# Patient Record
Sex: Female | Born: 1960 | Race: Black or African American | Hispanic: No | Marital: Single | Smoking: Current every day smoker
Health system: Southern US, Community
[De-identification: ages and names within clinical notes are randomized; demographics above are authoritative.]

## PROBLEM LIST (undated history)

## (undated) DIAGNOSIS — E785 Hyperlipidemia, unspecified: Secondary | ICD-10-CM

## (undated) DIAGNOSIS — N12 Tubulo-interstitial nephritis, not specified as acute or chronic: Secondary | ICD-10-CM

## (undated) DIAGNOSIS — T8859XA Other complications of anesthesia, initial encounter: Secondary | ICD-10-CM

## (undated) DIAGNOSIS — R519 Headache, unspecified: Secondary | ICD-10-CM

## (undated) DIAGNOSIS — T4145XA Adverse effect of unspecified anesthetic, initial encounter: Secondary | ICD-10-CM

## (undated) DIAGNOSIS — M797 Fibromyalgia: Secondary | ICD-10-CM

## (undated) DIAGNOSIS — I10 Essential (primary) hypertension: Secondary | ICD-10-CM

## (undated) DIAGNOSIS — Q8500 Neurofibromatosis, unspecified: Secondary | ICD-10-CM

## (undated) DIAGNOSIS — E119 Type 2 diabetes mellitus without complications: Secondary | ICD-10-CM

## (undated) DIAGNOSIS — H409 Unspecified glaucoma: Secondary | ICD-10-CM

## (undated) DIAGNOSIS — G40909 Epilepsy, unspecified, not intractable, without status epilepticus: Secondary | ICD-10-CM

## (undated) DIAGNOSIS — K219 Gastro-esophageal reflux disease without esophagitis: Secondary | ICD-10-CM

## (undated) DIAGNOSIS — R51 Headache: Secondary | ICD-10-CM

## (undated) HISTORY — PX: ABDOMINAL HYSTERECTOMY: SHX81

## (undated) HISTORY — PX: OTHER SURGICAL HISTORY: SHX169

## (undated) HISTORY — PX: CERVICAL FUSION: SHX112

## (undated) HISTORY — PX: COLON SURGERY: SHX602

---

## 2004-10-08 ENCOUNTER — Ambulatory Visit: Payer: Self-pay | Admitting: Family Medicine

## 2005-08-01 ENCOUNTER — Emergency Department: Payer: Self-pay | Admitting: Emergency Medicine

## 2006-03-16 ENCOUNTER — Ambulatory Visit: Payer: Self-pay | Admitting: Internal Medicine

## 2006-06-13 ENCOUNTER — Ambulatory Visit: Payer: Self-pay | Admitting: Pain Medicine

## 2006-06-19 ENCOUNTER — Ambulatory Visit: Payer: Self-pay | Admitting: Pain Medicine

## 2006-06-28 ENCOUNTER — Ambulatory Visit: Payer: Self-pay | Admitting: Pain Medicine

## 2006-07-04 ENCOUNTER — Ambulatory Visit: Payer: Self-pay | Admitting: Internal Medicine

## 2006-08-01 ENCOUNTER — Ambulatory Visit: Payer: Self-pay | Admitting: Pain Medicine

## 2006-08-16 ENCOUNTER — Ambulatory Visit: Payer: Self-pay | Admitting: Pain Medicine

## 2006-10-03 ENCOUNTER — Ambulatory Visit: Payer: Self-pay | Admitting: Pain Medicine

## 2006-10-04 ENCOUNTER — Ambulatory Visit: Payer: Self-pay | Admitting: Pain Medicine

## 2006-11-16 ENCOUNTER — Ambulatory Visit: Payer: Self-pay | Admitting: Pain Medicine

## 2006-11-29 ENCOUNTER — Ambulatory Visit: Payer: Self-pay | Admitting: Pain Medicine

## 2006-12-22 ENCOUNTER — Emergency Department: Payer: Self-pay

## 2006-12-27 ENCOUNTER — Emergency Department: Payer: Self-pay | Admitting: Unknown Physician Specialty

## 2006-12-29 DIAGNOSIS — E119 Type 2 diabetes mellitus without complications: Secondary | ICD-10-CM | POA: Insufficient documentation

## 2006-12-29 DIAGNOSIS — H409 Unspecified glaucoma: Secondary | ICD-10-CM | POA: Insufficient documentation

## 2007-01-04 ENCOUNTER — Ambulatory Visit: Payer: Self-pay | Admitting: Pain Medicine

## 2007-01-17 ENCOUNTER — Ambulatory Visit: Payer: Self-pay | Admitting: Pain Medicine

## 2007-01-19 ENCOUNTER — Ambulatory Visit: Payer: Self-pay | Admitting: Gastroenterology

## 2007-02-06 ENCOUNTER — Ambulatory Visit: Payer: Self-pay | Admitting: Pain Medicine

## 2007-02-12 ENCOUNTER — Ambulatory Visit: Payer: Self-pay | Admitting: Pain Medicine

## 2007-02-16 ENCOUNTER — Emergency Department: Payer: Self-pay | Admitting: Emergency Medicine

## 2007-03-15 ENCOUNTER — Ambulatory Visit: Payer: Self-pay | Admitting: Pain Medicine

## 2007-03-19 ENCOUNTER — Ambulatory Visit: Payer: Self-pay | Admitting: Pain Medicine

## 2007-03-28 ENCOUNTER — Ambulatory Visit: Payer: Self-pay | Admitting: Pain Medicine

## 2007-06-26 ENCOUNTER — Ambulatory Visit: Payer: Self-pay | Admitting: Pain Medicine

## 2007-07-04 ENCOUNTER — Ambulatory Visit: Payer: Self-pay | Admitting: Pain Medicine

## 2007-07-28 ENCOUNTER — Emergency Department: Payer: Self-pay | Admitting: Emergency Medicine

## 2007-10-11 ENCOUNTER — Ambulatory Visit: Payer: Self-pay | Admitting: Pain Medicine

## 2007-10-22 ENCOUNTER — Ambulatory Visit: Payer: Self-pay | Admitting: Pain Medicine

## 2007-11-22 ENCOUNTER — Ambulatory Visit: Payer: Self-pay | Admitting: Pain Medicine

## 2007-12-18 ENCOUNTER — Ambulatory Visit: Payer: Self-pay | Admitting: Pain Medicine

## 2008-01-22 ENCOUNTER — Ambulatory Visit: Payer: Self-pay | Admitting: Pain Medicine

## 2008-01-30 ENCOUNTER — Other Ambulatory Visit: Payer: Self-pay

## 2008-01-30 ENCOUNTER — Ambulatory Visit: Payer: Self-pay | Admitting: Unknown Physician Specialty

## 2008-02-07 ENCOUNTER — Ambulatory Visit: Payer: Self-pay | Admitting: Unknown Physician Specialty

## 2008-02-08 ENCOUNTER — Emergency Department: Payer: Self-pay | Admitting: Emergency Medicine

## 2008-03-06 ENCOUNTER — Ambulatory Visit: Payer: Self-pay | Admitting: Pain Medicine

## 2008-04-17 ENCOUNTER — Ambulatory Visit: Payer: Self-pay | Admitting: Pain Medicine

## 2008-04-30 ENCOUNTER — Ambulatory Visit: Payer: Self-pay | Admitting: Pain Medicine

## 2008-06-26 ENCOUNTER — Ambulatory Visit: Payer: Self-pay | Admitting: Pain Medicine

## 2008-07-02 ENCOUNTER — Ambulatory Visit: Payer: Self-pay | Admitting: Pain Medicine

## 2008-08-07 ENCOUNTER — Ambulatory Visit: Payer: Self-pay | Admitting: Pain Medicine

## 2008-08-18 ENCOUNTER — Ambulatory Visit: Payer: Self-pay | Admitting: Pain Medicine

## 2008-08-30 ENCOUNTER — Emergency Department: Payer: Self-pay | Admitting: Emergency Medicine

## 2009-02-10 ENCOUNTER — Ambulatory Visit: Payer: Self-pay | Admitting: Pain Medicine

## 2009-03-10 ENCOUNTER — Ambulatory Visit: Payer: Self-pay | Admitting: Pain Medicine

## 2009-03-16 ENCOUNTER — Ambulatory Visit: Payer: Self-pay | Admitting: Pain Medicine

## 2009-04-01 ENCOUNTER — Ambulatory Visit: Payer: Self-pay | Admitting: Unknown Physician Specialty

## 2009-04-08 ENCOUNTER — Ambulatory Visit: Payer: Self-pay | Admitting: Unknown Physician Specialty

## 2009-04-14 ENCOUNTER — Inpatient Hospital Stay: Payer: Self-pay | Admitting: Unknown Physician Specialty

## 2009-07-23 ENCOUNTER — Ambulatory Visit: Payer: Self-pay | Admitting: Internal Medicine

## 2009-08-07 ENCOUNTER — Ambulatory Visit: Payer: Self-pay | Admitting: Internal Medicine

## 2009-09-18 ENCOUNTER — Ambulatory Visit: Payer: Self-pay | Admitting: Internal Medicine

## 2009-09-21 ENCOUNTER — Ambulatory Visit: Payer: Self-pay | Admitting: Gastroenterology

## 2009-10-07 ENCOUNTER — Ambulatory Visit: Payer: Self-pay | Admitting: Internal Medicine

## 2009-12-24 ENCOUNTER — Ambulatory Visit: Payer: Self-pay | Admitting: Pain Medicine

## 2009-12-28 ENCOUNTER — Ambulatory Visit: Payer: Self-pay | Admitting: Pain Medicine

## 2009-12-29 ENCOUNTER — Ambulatory Visit: Payer: Self-pay | Admitting: Pain Medicine

## 2010-01-21 ENCOUNTER — Ambulatory Visit: Payer: Self-pay | Admitting: Pain Medicine

## 2010-02-01 ENCOUNTER — Ambulatory Visit: Payer: Self-pay | Admitting: Pain Medicine

## 2010-09-06 ENCOUNTER — Encounter: Payer: Self-pay | Admitting: Rheumatology

## 2010-09-07 ENCOUNTER — Encounter: Payer: Self-pay | Admitting: Rheumatology

## 2010-10-07 ENCOUNTER — Encounter: Payer: Self-pay | Admitting: Rheumatology

## 2011-01-18 ENCOUNTER — Ambulatory Visit: Payer: Self-pay | Admitting: Unknown Physician Specialty

## 2011-02-02 ENCOUNTER — Ambulatory Visit: Payer: Self-pay | Admitting: Anesthesiology

## 2011-02-03 ENCOUNTER — Ambulatory Visit: Payer: Self-pay | Admitting: Unknown Physician Specialty

## 2011-02-05 ENCOUNTER — Emergency Department: Payer: Self-pay | Admitting: Internal Medicine

## 2011-05-16 ENCOUNTER — Ambulatory Visit: Payer: Self-pay | Admitting: Unknown Physician Specialty

## 2011-05-26 ENCOUNTER — Inpatient Hospital Stay: Payer: Self-pay | Admitting: Internal Medicine

## 2011-06-09 ENCOUNTER — Ambulatory Visit: Payer: Self-pay | Admitting: Unknown Physician Specialty

## 2011-06-16 ENCOUNTER — Ambulatory Visit: Payer: Self-pay | Admitting: Unknown Physician Specialty

## 2012-08-22 ENCOUNTER — Ambulatory Visit: Payer: Self-pay | Admitting: Internal Medicine

## 2015-06-19 ENCOUNTER — Ambulatory Visit
Admission: RE | Admit: 2015-06-19 | Discharge: 2015-06-19 | Disposition: A | Discharge status: Home / Self Care | Payer: Medicare HMO | Source: Ambulatory Visit | Attending: Internal Medicine | Admitting: Internal Medicine

## 2015-06-19 ENCOUNTER — Other Ambulatory Visit: Payer: Self-pay | Admitting: Internal Medicine

## 2015-06-19 DIAGNOSIS — J40 Bronchitis, not specified as acute or chronic: Secondary | ICD-10-CM

## 2015-11-17 ENCOUNTER — Other Ambulatory Visit: Payer: Self-pay | Admitting: Internal Medicine

## 2015-11-17 ENCOUNTER — Ambulatory Visit
Admission: RE | Admit: 2015-11-17 | Discharge: 2015-11-17 | Disposition: A | Discharge status: Home / Self Care | Payer: Medicare HMO | Source: Ambulatory Visit | Attending: Internal Medicine | Admitting: Internal Medicine

## 2015-11-17 DIAGNOSIS — R918 Other nonspecific abnormal finding of lung field: Secondary | ICD-10-CM | POA: Insufficient documentation

## 2015-11-17 DIAGNOSIS — F17200 Nicotine dependence, unspecified, uncomplicated: Secondary | ICD-10-CM | POA: Diagnosis present

## 2015-11-17 DIAGNOSIS — R042 Hemoptysis: Secondary | ICD-10-CM

## 2016-02-11 ENCOUNTER — Encounter: Payer: Self-pay | Admitting: *Deleted

## 2016-02-12 ENCOUNTER — Encounter: Payer: Self-pay | Admitting: Anesthesiology

## 2016-02-12 ENCOUNTER — Ambulatory Visit: Payer: Medicare HMO | Admitting: Anesthesiology

## 2016-02-12 ENCOUNTER — Encounter
Admission: RE | Disposition: A | Discharge status: Home / Self Care | Payer: Self-pay | Source: Ambulatory Visit | Attending: Gastroenterology

## 2016-02-12 ENCOUNTER — Ambulatory Visit
Admission: RE | Admit: 2016-02-12 | Discharge: 2016-02-12 | Disposition: A | Discharge status: Home / Self Care | Payer: Medicare HMO | Source: Ambulatory Visit | Attending: Gastroenterology | Admitting: Gastroenterology

## 2016-02-12 DIAGNOSIS — Z79899 Other long term (current) drug therapy: Secondary | ICD-10-CM | POA: Diagnosis not present

## 2016-02-12 DIAGNOSIS — K644 Residual hemorrhoidal skin tags: Secondary | ICD-10-CM | POA: Insufficient documentation

## 2016-02-12 DIAGNOSIS — R1310 Dysphagia, unspecified: Secondary | ICD-10-CM | POA: Diagnosis present

## 2016-02-12 DIAGNOSIS — Z90710 Acquired absence of both cervix and uterus: Secondary | ICD-10-CM | POA: Diagnosis not present

## 2016-02-12 DIAGNOSIS — K210 Gastro-esophageal reflux disease with esophagitis: Secondary | ICD-10-CM | POA: Insufficient documentation

## 2016-02-12 DIAGNOSIS — K625 Hemorrhage of anus and rectum: Secondary | ICD-10-CM | POA: Diagnosis not present

## 2016-02-12 DIAGNOSIS — I10 Essential (primary) hypertension: Secondary | ICD-10-CM | POA: Insufficient documentation

## 2016-02-12 DIAGNOSIS — Z6836 Body mass index (BMI) 36.0-36.9, adult: Secondary | ICD-10-CM | POA: Insufficient documentation

## 2016-02-12 DIAGNOSIS — E669 Obesity, unspecified: Secondary | ICD-10-CM | POA: Diagnosis not present

## 2016-02-12 DIAGNOSIS — F17200 Nicotine dependence, unspecified, uncomplicated: Secondary | ICD-10-CM | POA: Insufficient documentation

## 2016-02-12 DIAGNOSIS — Z9889 Other specified postprocedural states: Secondary | ICD-10-CM | POA: Insufficient documentation

## 2016-02-12 DIAGNOSIS — K640 First degree hemorrhoids: Secondary | ICD-10-CM | POA: Insufficient documentation

## 2016-02-12 DIAGNOSIS — E785 Hyperlipidemia, unspecified: Secondary | ICD-10-CM | POA: Insufficient documentation

## 2016-02-12 DIAGNOSIS — R12 Heartburn: Secondary | ICD-10-CM | POA: Insufficient documentation

## 2016-02-12 DIAGNOSIS — Z791 Long term (current) use of non-steroidal anti-inflammatories (NSAID): Secondary | ICD-10-CM | POA: Diagnosis not present

## 2016-02-12 HISTORY — PX: ESOPHAGOGASTRODUODENOSCOPY (EGD) WITH PROPOFOL: SHX5813

## 2016-02-12 HISTORY — DX: Hyperlipidemia, unspecified: E78.5

## 2016-02-12 HISTORY — DX: Essential (primary) hypertension: I10

## 2016-02-12 HISTORY — DX: Gastro-esophageal reflux disease without esophagitis: K21.9

## 2016-02-12 HISTORY — PX: COLONOSCOPY WITH PROPOFOL: SHX5780

## 2016-02-12 LAB — GLUCOSE, CAPILLARY
Glucose-Capillary: 291 mg/dL — ABNORMAL HIGH (ref 65–99)
Glucose-Capillary: 243 mg/dL — ABNORMAL HIGH (ref 65–99)

## 2016-02-12 SURGERY — COLONOSCOPY WITH PROPOFOL
Anesthesia: General

## 2016-02-12 MED ORDER — SODIUM CHLORIDE 0.9 % IV SOLN
INTRAVENOUS | Status: DC
  Administered 2016-02-12 (×2): via INTRAVENOUS

## 2016-02-12 MED ORDER — PROPOFOL 10 MG/ML IV BOLUS
INTRAVENOUS | Status: DC | PRN
  Administered 2016-02-12: 100 mg via INTRAVENOUS

## 2016-02-12 MED ORDER — PROPOFOL 500 MG/50ML IV EMUL
INTRAVENOUS | Status: DC | PRN
  Administered 2016-02-12: 150 ug/kg/min via INTRAVENOUS

## 2016-02-12 MED ORDER — INSULIN ASPART 100 UNIT/ML ~~LOC~~ SOLN
6.0000 [IU] | Freq: Once | SUBCUTANEOUS | Status: AC
  Administered 2016-02-12: 6 [IU] via SUBCUTANEOUS
  Filled 2016-02-12: qty 0.06

## 2016-02-12 NOTE — Discharge Instructions (Signed)

## 2016-02-12 NOTE — Op Note (Signed)
[facility] Gastroenterology Patient Name: [place] Procedure Date: 02/12/2016 9:04 AM MRN: [id number] Account #: [id number] Date of Birth: [birth year] Admit Type: Outpatient Age: 55 Room: [facility] ENDO ROOM 3 Gender: Female Note Status: Finalized Procedure:            Colonoscopy Indications:          Last colonoscopy: date unknown(remote past), Rectal                        bleeding Patient Profile:      This is a 55 year old female. Providers:            Gerrit Heck. Rayann Heman, MD Referring MD:         Mikeal Hawthorne. Brynda Greathouse, MD (Referring MD) Medicines:            Propofol per Anesthesia Complications:        No immediate complications. Procedure:            Pre-Anesthesia Assessment:                       - Prior to the procedure, a History and Physical was                        performed, and patient medications, allergies and                        sensitivities were reviewed. The patient's tolerance of                        previous anesthesia was reviewed.                       After obtaining informed consent, the colonoscope was                        passed under direct vision. Throughout the procedure,                        the patient's blood pressure, pulse, and oxygen                        saturations were monitored continuously. The                        Colonoscope was introduced through the anus and                        advanced to the the cecum, identified by appendiceal                        orifice and ileocecal valve. The colonoscopy was                        performed without difficulty. The patient tolerated the                        procedure well. The quality of the bowel preparation                        was fair with poor areas in sigmoid and rectum. The  patient tolerated the procedure well. Findings:      The perianal exam findings include non-thrombosed external hemorrhoids.      Internal hemorrhoids were  found during retroflexion. The hemorrhoids       were Grade I (internal hemorrhoids that do not prolapse).      The exam was otherwise without abnormality. Impression:           - Preparation of the colon was fair with poor areas in                        sigmoid and rectum.                       - Non-thrombosed external hemorrhoids found on perianal                        exam.                       - Internal hemorrhoids.                       - The examination was otherwise normal.                       - No specimens collected. Recommendation:       - Observe patient in GI recovery unit.                       - High fiber diet.                       - Continue present medications.                       - Repeat colonoscopy in 1 year because the bowel                        preparation was fair with poor areas in rectum and                        sigmoid                       - Return to referring physician.                       - The findings and recommendations were discussed with                        the patient.                       - The findings and recommendations were discussed with                        the patient's family. Procedure Code(s):    --- Professional ---                       [phone], Colonoscopy, flexible; diagnostic, including                        collection of specimen(s) by brushing or washing, when  performed (separate procedure) Diagnosis Code(s):    --- Professional ---                       K64.0, First degree hemorrhoids                       K64.4, Residual hemorrhoidal skin tags                       K62.5, Hemorrhage of anus and rectum CPT copyright 2016 American Medical Association. All rights reserved. The codes documented in this report are preliminary and upon coder review may  be revised to meet current compliance requirements. Mellody Life, MD 02/12/2016 9:36:27 AM This report has been signed  electronically. Number of Addenda: 0 Note Initiated On: 02/12/2016 9:04 AM Scope Withdrawal Time: 0 hours 18 minutes 57 seconds  Total Procedure Duration: 0 hours 25 minutes 5 seconds       [facility]

## 2016-02-12 NOTE — Anesthesia Preprocedure Evaluation (Addendum)
Anesthesia Evaluation  Patient identified by MRN, date of birth, ID band Patient awake    Reviewed: Allergy & Precautions, NPO status , Patient's Chart, lab work & pertinent test results, reviewed documented beta blocker date and time   Airway Mallampati: III  TM Distance: >3 FB     Dental  (+) Chipped, Missing   Pulmonary Current Smoker,           Cardiovascular hypertension,      Neuro/Psych    GI/Hepatic GERD  ,  Endo/Other    Renal/GU      Musculoskeletal   Abdominal   Peds  Hematology   Anesthesia Other Findings Obese.  Reproductive/Obstetrics                            Anesthesia Physical Anesthesia Plan  ASA: II  Anesthesia Plan: General   Post-op Pain Management:    Induction: Intravenous  Airway Management Planned: Nasal Cannula  Additional Equipment:   Intra-op Plan:   Post-operative Plan:   Informed Consent: I have reviewed the patients History and Physical, chart, labs and discussed the procedure including the risks, benefits and alternatives for the proposed anesthesia with the patient or authorized representative who has indicated his/her understanding and acceptance.     Plan Discussed with: CRNA  Anesthesia Plan Comments:         Anesthesia Quick Evaluation

## 2016-02-12 NOTE — Op Note (Signed)
[facility] Gastroenterology Patient Name: [place] Procedure Date: 02/12/2016 8:52 AM MRN: [id number] Account #: [id number] Date of Birth: 10/21/1961 Admit Type: Outpatient Age: 55 Room: [facility] ENDO ROOM 3 Gender: Female Note Status: Finalized Procedure:            Upper GI endoscopy Indications:          Dysphagia, Heartburn, Suspected esophageal reflux Patient Profile:      This is a 55 year old female. Providers:            Gerrit Heck. Rayann Heman, MD Referring MD:         Mikeal Hawthorne. Brynda Greathouse, MD (Referring MD) Medicines:            Propofol per Anesthesia Complications:        No immediate complications. Procedure:            Pre-Anesthesia Assessment:                       - Prior to the procedure, a History and Physical was                        performed, and patient medications, allergies and                        sensitivities were reviewed. The patient's tolerance of                        previous anesthesia was reviewed.                       After obtaining informed consent, the endoscope was                        passed under direct vision. Throughout the procedure,                        the patient's blood pressure, pulse, and oxygen                        saturations were monitored continuously. The Endoscope                        was introduced through the mouth, and advanced to the                        second part of duodenum. The upper GI endoscopy was                        accomplished without difficulty. The patient tolerated                        the procedure well. Findings:      The Z-line was irregular and was found at the gastroesophageal junction.       Biopsies were taken with a cold forceps for histology.      The stomach was normal.      The examined duodenum was normal.      Three biopsies were obtained with cold forceps for histology randomly in       the upper third of the esophagus. Impression:           -  Z-line irregular,  at the gastroesophageal junction.                        Biopsied.                       - Normal stomach.                       - Normal examined duodenum. Recommendation:       - Perform a colonoscopy today.                       - Continue present medications.                       - Await pathology results.                       - The findings and recommendations were discussed with                        the patient.                       - The findings and recommendations were discussed with                        the patient's family. Procedure Code(s):    --- Professional ---                       [phone], Esophagogastroduodenoscopy, flexible, transoral;                        with biopsy, single or multiple Diagnosis Code(s):    --- Professional ---                       K22.8, Other specified diseases of esophagus                       R13.10, Dysphagia, unspecified                       R12, Heartburn CPT copyright 2016 American Medical Association. All rights reserved. The codes documented in this report are preliminary and upon coder review may  be revised to meet current compliance requirements. Mellody Life, MD 02/12/2016 9:04:08 AM This report has been signed electronically. Number of Addenda: 0 Note Initiated On: 02/12/2016 8:52 AM      [facility]

## 2016-02-12 NOTE — Transfer of Care (Signed)
Immediate Anesthesia Transfer of Care Note  Patient: Laura Camacho  Procedure(s) Performed: Procedure(s): COLONOSCOPY WITH PROPOFOL (N/A) ESOPHAGOGASTRODUODENOSCOPY (EGD) WITH PROPOFOL (N/A)  Patient Location: PACU  Anesthesia Type:General  Level of Consciousness: awake and alert   Airway & Oxygen Therapy: Patient Spontanous Breathing and Patient connected to nasal cannula oxygen  Post-op Assessment: Report given to RN  Post vital signs: Reviewed  Last Vitals:  Filed Vitals:   02/12/16 0750  BP: 149/69  Pulse: 72  Temp: 36.3 C  Resp: 16    Complications: No apparent anesthesia complications

## 2016-02-12 NOTE — H&P (Signed)
  Primary Care Physician:  Marden Noble, MD  Pre-Procedure History & Physical: HPI:  Laura Camacho is a 55 y.o. female is here for an endoscopy / colonoscopy.    Past Medical History  Diagnosis Date  . GERD (gastroesophageal reflux disease)   . Hypertension   . Hyperlipidemia     Past Surgical History  Procedure Laterality Date  . Abdominal hysterectomy    . Colon surgery    . Bilateral arm surgery Bilateral     'tumors' removed    Prior to Admission medications   Medication Sig Start Date End Date Taking? Authorizing Provider  atorvastatin (LIPITOR) 20 MG tablet Take 20 mg by mouth daily.   Yes Historical Provider, MD  clarithromycin (BIAXIN) 500 MG tablet Take 500 mg by mouth 2 (two) times daily.   Yes Historical Provider, MD  dexlansoprazole (DEXILANT) 60 MG capsule Take 60 mg by mouth daily.   Yes Historical Provider, MD  esterified estrogens (MENEST) 0.625 MG tablet Take 0.625 mg by mouth daily.   Yes Historical Provider, MD  gabapentin (NEURONTIN) 300 MG capsule Take 300 mg by mouth 2 (two) times daily.   Yes Historical Provider, MD  meloxicam (MOBIC) 15 MG tablet Take 15 mg by mouth daily.   Yes Historical Provider, MD  nitrofurantoin, macrocrystal-monohydrate, (MACROBID) 100 MG capsule Take 100 mg by mouth 2 (two) times daily.   Yes Historical Provider, MD  omeprazole (PRILOSEC) 40 MG capsule Take 40 mg by mouth daily.   Yes Historical Provider, MD  ranitidine (ZANTAC) 150 MG capsule Take 150 mg by mouth 2 (two) times daily.   Yes Historical Provider, MD  saxagliptin HCl (ONGLYZA) 2.5 MG TABS tablet Take 2.5 mg by mouth daily.   Yes Historical Provider, MD  terazosin (HYTRIN) 5 MG capsule Take 5 mg by mouth at bedtime.   Yes Historical Provider, MD    Allergies as of 01/26/2016  . (Not on File)    History reviewed. No pertinent family history.  Social History   Social History  . Marital Status: Single    Spouse Name: N/A  . Number of Children: N/A  .  Years of Education: N/A   Occupational History  . Not on file.   Social History Main Topics  . Smoking status: Current Every Day Smoker  . Smokeless tobacco: Never Used  . Alcohol Use: No  . Drug Use: No  . Sexual Activity: Not on file   Other Topics Concern  . Not on file   Social History Narrative     Physical Exam: BP 149/69 mmHg  Pulse 72  Temp(Src) 97.3 F (36.3 C) (Tympanic)  Resp 16  Ht 5\' 2"  (1.575 m)  Wt 90.719 kg (200 lb)  BMI 36.57 kg/m2  SpO2 100% General:   Alert,  pleasant and cooperative in NAD Head:  Normocephalic and atraumatic. Neck:  Supple; no masses or thyromegaly. Lungs:  Clear throughout to auscultation.    Heart:  Regular rate and rhythm. Abdomen:  Soft, nontender and nondistended. Normal bowel sounds, without guarding, and without rebound.   Neurologic:  Alert and  oriented x4;  grossly normal neurologically.  Impression/Plan: Laura Camacho is here for an endoscopy to be performed for GERD, regurgitation. Colon for rectal bleeding.  Risks, benefits, limitations, and alternatives regarding  Endoscopy/colonoscopy have been reviewed with the patient.  Questions have been answered.  All parties agreeable.   Josefine Class, MD  02/12/2016, 8:47 AM

## 2016-02-12 NOTE — Anesthesia Postprocedure Evaluation (Signed)
Anesthesia Post Note  Patient: Laura Camacho  Procedure(s) Performed: Procedure(s) (LRB): COLONOSCOPY WITH PROPOFOL (N/A) ESOPHAGOGASTRODUODENOSCOPY (EGD) WITH PROPOFOL (N/A)  Patient location during evaluation: Endoscopy Anesthesia Type: General Level of consciousness: awake and alert Pain management: pain level controlled Vital Signs Assessment: post-procedure vital signs reviewed and stable Respiratory status: spontaneous breathing, nonlabored ventilation, respiratory function stable and patient connected to nasal cannula oxygen Cardiovascular status: blood pressure returned to baseline and stable Postop Assessment: no signs of nausea or vomiting Anesthetic complications: no    Last Vitals:  Filed Vitals:   02/12/16 0950 02/12/16 1000  BP: 155/90 163/81  Pulse: 79   Temp:    Resp: 20     Last Pain: There were no vitals filed for this visit.               [name] S

## 2016-02-15 LAB — SURGICAL PATHOLOGY

## 2016-02-16 ENCOUNTER — Encounter: Payer: Self-pay | Admitting: Gastroenterology

## 2016-03-06 ENCOUNTER — Encounter: Payer: Self-pay | Admitting: Emergency Medicine

## 2016-03-06 ENCOUNTER — Emergency Department: Payer: Medicare HMO

## 2016-03-06 ENCOUNTER — Emergency Department
Admission: EM | Admit: 2016-03-06 | Discharge: 2016-03-07 | Disposition: A | Discharge status: Home / Self Care | Payer: Medicare HMO | Attending: Student | Admitting: Student

## 2016-03-06 DIAGNOSIS — E119 Type 2 diabetes mellitus without complications: Secondary | ICD-10-CM | POA: Insufficient documentation

## 2016-03-06 DIAGNOSIS — I10 Essential (primary) hypertension: Secondary | ICD-10-CM | POA: Diagnosis not present

## 2016-03-06 DIAGNOSIS — R531 Weakness: Secondary | ICD-10-CM | POA: Insufficient documentation

## 2016-03-06 DIAGNOSIS — F17200 Nicotine dependence, unspecified, uncomplicated: Secondary | ICD-10-CM | POA: Diagnosis not present

## 2016-03-06 DIAGNOSIS — R55 Syncope and collapse: Secondary | ICD-10-CM | POA: Insufficient documentation

## 2016-03-06 DIAGNOSIS — E785 Hyperlipidemia, unspecified: Secondary | ICD-10-CM | POA: Insufficient documentation

## 2016-03-06 HISTORY — DX: Type 2 diabetes mellitus without complications: E11.9

## 2016-03-06 LAB — BASIC METABOLIC PANEL
ANION GAP: 15 (ref 5–15)
BUN: 21 mg/dL — ABNORMAL HIGH (ref 6–20)
CHLORIDE: 97 mmol/L — AB (ref 101–111)
CO2: 22 mmol/L (ref 22–32)
Calcium: 9.8 mg/dL (ref 8.9–10.3)
Creatinine, Ser: 1.19 mg/dL — ABNORMAL HIGH (ref 0.44–1.00)
GFR calc non Af Amer: 50 mL/min — ABNORMAL LOW (ref >60)
GFR, EST AFRICAN AMERICAN: 58 mL/min — AB (ref >60)
GLUCOSE: 316 mg/dL — AB (ref 65–99)
POTASSIUM: 3.9 mmol/L (ref 3.5–5.1)
Sodium: 134 mmol/L — ABNORMAL LOW (ref 135–145)

## 2016-03-06 LAB — URINALYSIS COMPLETE WITH MICROSCOPIC (ARMC ONLY)
BACTERIA UA: NONE SEEN
Bilirubin Urine: NEGATIVE
Hgb urine dipstick: NEGATIVE
Leukocytes, UA: NEGATIVE
Nitrite: NEGATIVE
Protein, ur: 30 mg/dL — AB
RBC / HPF: NONE SEEN RBC/hpf (ref 0–5)
SPECIFIC GRAVITY, URINE: 1.021 (ref 1.005–1.030)
pH: 5 (ref 5.0–8.0)

## 2016-03-06 LAB — TROPONIN I: Troponin I: <0.03 ng/mL (ref <0.031)

## 2016-03-06 LAB — GLUCOSE, CAPILLARY: Glucose-Capillary: 296 mg/dL — ABNORMAL HIGH (ref 65–99)

## 2016-03-06 LAB — CBC
HCT: 45.4 % (ref 35.0–47.0)
HEMOGLOBIN: 15.1 g/dL (ref 12.0–16.0)
MCH: 26.9 pg (ref 26.0–34.0)
MCHC: 33.3 g/dL (ref 32.0–36.0)
MCV: 80.8 fL (ref 80.0–100.0)
Platelets: 367 10*3/uL (ref 150–440)
RBC: 5.62 MIL/uL — AB (ref 3.80–5.20)
RDW: 13.6 % (ref 11.5–14.5)
WBC: 12.6 10*3/uL — ABNORMAL HIGH (ref 3.6–11.0)

## 2016-03-06 LAB — FIBRIN DERIVATIVES D-DIMER (ARMC ONLY): Fibrin derivatives D-dimer (ARMC): 890 — ABNORMAL HIGH (ref 0–499)

## 2016-03-06 MED ORDER — SODIUM CHLORIDE 0.9 % IV BOLUS (SEPSIS)
1000.0000 mL | Freq: Once | INTRAVENOUS | Status: AC
  Administered 2016-03-06: 1000 mL via INTRAVENOUS

## 2016-03-06 MED ORDER — IOPAMIDOL (ISOVUE-370) INJECTION 76%
75.0000 mL | Freq: Once | INTRAVENOUS | Status: AC | PRN
  Administered 2016-03-06: 75 mL via INTRAVENOUS

## 2016-03-06 MED ORDER — HYDROCORTISONE NA SUCCINATE PF 100 MG IJ SOLR
INTRAMUSCULAR | Status: AC
  Administered 2016-03-06: 200 mg via INTRAVENOUS
  Filled 2016-03-06: qty 4

## 2016-03-06 MED ORDER — DIPHENHYDRAMINE HCL 50 MG/ML IJ SOLN
50.0000 mg | Freq: Once | INTRAMUSCULAR | Status: AC
Start: 2016-03-06 — End: 2016-03-06
  Administered 2016-03-06: 50 mg via INTRAVENOUS
  Filled 2016-03-06: qty 1

## 2016-03-06 MED ORDER — HYDROCORTISONE NA SUCCINATE PF 250 MG IJ SOLR
200.0000 mg | Freq: Once | INTRAMUSCULAR | Status: AC
  Administered 2016-03-06: 200 mg via INTRAVENOUS
  Filled 2016-03-06: qty 200

## 2016-03-06 NOTE — ED Notes (Addendum)
Pt. States she was sitting on a stool/chair in shower when she started to feel weak this morning pt. Denies hitting head.

## 2016-03-06 NOTE — ED Notes (Signed)
Patient transported to X-ray 

## 2016-03-06 NOTE — ED Provider Notes (Signed)
-----------------------------------------   11:58 PM on 03/06/2016 -----------------------------------------   Blood pressure 161/74, pulse 67, temperature 97.7 F (36.5 C), temperature source Oral, resp. rate 22, height 5\' 2"  (1.575 m), weight 200 lb (90.719 kg), SpO2 97 %.  Assuming care from Dr. Reita Cliche.  In short, Laura Camacho is a 55 y.o. female with a chief complaint of Weakness .  Refer to the original H&P for additional details.  The current plan of care is to follow up the results of the CT scan and disposition the patient.   CT Angio chest: No evidence of significant pulmonary embolus. No evidence of acute pulmonary disease. Diffuse fatty infiltration of the liver  The patient's CT scan is unremarkable. She will be discharged home to follow-up with her primary care physician. I discussed this with the patient and she had no further questions or concerns at this time.  Loney Hering, MD 03/06/16 [phone]

## 2016-03-06 NOTE — ED Notes (Signed)
Pt presents to ED with complaints of generalized weakness that has gradually worsened ever since having a colonoscopy on 02/12/2016. Pt states she has been taking all of her medications as ordered. Pt reports her CBG at home has been stable and runs around 100. Pt states she takes oral medications for diabetes.

## 2016-03-06 NOTE — ED Provider Notes (Signed)
[facility] Emergency Department Provider Note   ____________________________________________  Time seen: Approximately 2:26 PM  I have reviewed the triage vital signs and the nursing notes.   HISTORY  Chief Complaint Weakness    HPI Laura Camacho is a 55 y.o. female with history of hypertension, hyperlipidemia, diabetes, GERD, who presents for evaluation of possibly 3 weeks of generalized weakness and fatigue, gradual onset, constant, no modifying factors. Patient reports that she had an endoscopy performed on 02/12/2016 under general anesthesia for evaluation of GERD and rectal bleeding. She reports that the procedure well well however since that time she has had fatigue and poor energy. Today she was attempting to give herself a bath while sitting on a shower stool and she began feeling quite weak, lightheaded and as if she might faint. Her family was nearby and they removed her from the tub before she could fall or hurt herself. They're not sure whether not she fainted or nearly fainted No chest pain or difficulty breathing. No abdominal pain, vomiting, diarrhea or fevers or chills. No hematemastasis or recent bright red blood per rectum. She does take an estrogen supplement for treatment of menopausal symptoms.   Past Medical History  Diagnosis Date  . GERD (gastroesophageal reflux disease)   . Hypertension   . Hyperlipidemia   . Diabetes mellitus without complication ([place])     There are no active problems to display for this patient.   Past Surgical History  Procedure Laterality Date  . Abdominal hysterectomy    . Colon surgery    . Bilateral arm surgery Bilateral     'tumors' removed  . Colonoscopy with propofol N/A 02/12/2016    Procedure: COLONOSCOPY WITH PROPOFOL;  Surgeon: Josefine Class, MD;  Location: [facility] ENDOSCOPY;  Service: Endoscopy;  Laterality: N/A;  . Esophagogastroduodenoscopy (egd) with propofol N/A 02/12/2016    Procedure:  ESOPHAGOGASTRODUODENOSCOPY (EGD) WITH PROPOFOL;  Surgeon: Josefine Class, MD;  Location: [facility] ENDOSCOPY;  Service: Endoscopy;  Laterality: N/A;    Current Outpatient Rx  Name  Route  Sig  Dispense  Refill  . atorvastatin (LIPITOR) 20 MG tablet   Oral   Take 20 mg by mouth daily.         . clarithromycin (BIAXIN) 500 MG tablet   Oral   Take 500 mg by mouth 2 (two) times daily.         Marland Kitchen dexlansoprazole (DEXILANT) 60 MG capsule   Oral   Take 60 mg by mouth daily.         Marland Kitchen esterified estrogens (MENEST) 0.625 MG tablet   Oral   Take 0.625 mg by mouth daily.         Marland Kitchen gabapentin (NEURONTIN) 300 MG capsule   Oral   Take 300 mg by mouth 2 (two) times daily.         . meloxicam (MOBIC) 15 MG tablet   Oral   Take 15 mg by mouth daily.         . nitrofurantoin, macrocrystal-monohydrate, (MACROBID) 100 MG capsule   Oral   Take 100 mg by mouth 2 (two) times daily.         Marland Kitchen omeprazole (PRILOSEC) 40 MG capsule   Oral   Take 40 mg by mouth daily.         . ranitidine (ZANTAC) 150 MG capsule   Oral   Take 150 mg by mouth 2 (two) times daily.         Marland Kitchen  saxagliptin HCl (ONGLYZA) 2.5 MG TABS tablet   Oral   Take 2.5 mg by mouth daily.         Marland Kitchen terazosin (HYTRIN) 5 MG capsule   Oral   Take 5 mg by mouth at bedtime.           Allergies Codeine; Bupropion; Cefuroxime; Ciprofloxacin; Fluconazole; Ibuprofen; Iodine; Ivp dye; Metformin and related; Nizoral; Sulfa antibiotics; and Voltaren  No family history on file.  Social History Social History  Substance Use Topics  . Smoking status: Current Every Day Smoker  . Smokeless tobacco: Never Used  . Alcohol Use: No    Review of Systems Constitutional: No fever/chills Eyes: No visual changes. ENT: No sore throat. Cardiovascular: Denies chest pain. Respiratory: Denies shortness of breath. Gastrointestinal: No abdominal pain.  No nausea, no vomiting.  No diarrhea.  No constipation. Genitourinary:  Negative for dysuria. Musculoskeletal: Negative for back pain. Skin: Negative for rash. Neurological: Negative for headaches, focal weakness or numbness.  10-point ROS otherwise negative.  ____________________________________________   PHYSICAL EXAM:  VITAL SIGNS: ED Triage Vitals  Enc Vitals Group     BP 03/06/16 1348 164/87 mmHg     Pulse Rate 03/06/16 1348 77     Resp 03/06/16 1348 18     Temp 03/06/16 1348 97.7 F (36.5 C)     Temp Source 03/06/16 1348 Oral     SpO2 03/06/16 1348 97 %     Weight 03/06/16 1348 200 lb (90.719 kg)     Height 03/06/16 1348 5\' 2"  (1.575 m)     Head Cir --      Peak Flow --      Pain Score --      Pain Loc --      Pain Edu? --      Excl. in [place]? --     Constitutional: Alert and oriented. Well appearing and in no acute distress. Eyes: Conjunctivae are normal. PERRL. EOMI. Head: Atraumatic. Nose: No congestion/rhinnorhea. Mouth/Throat: Mucous membranes are moist.  Oropharynx non-erythematous. Neck: No stridor.  Supple without meningismus. Cardiovascular: Normal rate, regular rhythm. Grossly normal heart sounds.  Good peripheral circulation. Respiratory: Normal respiratory effort.  No retractions. Lungs CTAB. Gastrointestinal: Soft and nontender. No distention.  No CVA tenderness. Genitourinary: deferred Musculoskeletal: No lower extremity tenderness nor edema.  No joint effusions. Neurologic:  Normal speech and language. No gross focal neurologic deficits are appreciated. No gait instability. 5 out of 5 strength bilateral upper and lower extremities, sensation intact to light touch throughout, cranial nerves II through XII intact. Skin:  Skin is warm, dry and intact. No rash noted. Psychiatric: Mood and affect are normal. Speech and behavior are normal.  ____________________________________________   LABS (all labs ordered are listed, but only abnormal results are displayed)  Labs Reviewed  BASIC METABOLIC PANEL - Abnormal; Notable for  the following:    Sodium 134 (*)    Chloride 97 (*)    Glucose, Bld 316 (*)    BUN 21 (*)    Creatinine, Ser 1.19 (*)    GFR calc non Af Amer 50 (*)    GFR calc Af Amer 58 (*)    All other components within normal limits  CBC - Abnormal; Notable for the following:    WBC 12.6 (*)    RBC 5.62 (*)    All other components within normal limits  URINALYSIS COMPLETEWITH MICROSCOPIC ([facility] ONLY) - Abnormal; Notable for the following:    Color, Urine STRAW (*)  APPearance CLEAR (*)    Glucose, UA >500 (*)    Ketones, ur TRACE (*)    Protein, ur 30 (*)    Squamous Epithelial / LPF 0-5 (*)    All other components within normal limits  GLUCOSE, CAPILLARY - Abnormal; Notable for the following:    Glucose-Capillary 296 (*)    All other components within normal limits  FIBRIN DERIVATIVES D-DIMER ([facility] ONLY) - Abnormal; Notable for the following:    Fibrin derivatives D-dimer ([facility]) 890 (*)    All other components within normal limits  TROPONIN I  CBG MONITORING, ED   ____________________________________________  EKG  ED ECG REPORT I, Joanne Gavel, the attending physician, personally viewed and interpreted this ECG.   Date: 03/06/2016  EKG Time: 17:16  Rate: 71  Rhythm: normal sinus rhythm  Axis: Borderline left axis deviation.  Intervals:none  ST&T Change: No acute ST elevation.  ____________________________________________  RADIOLOGY  CXR  IMPRESSION: No active cardiopulmonary disease.  ____________________________________________   PROCEDURES  Procedure(s) performed: None  Critical Care performed: No  ____________________________________________   INITIAL IMPRESSION / ASSESSMENT AND PLAN / ED COURSE  Pertinent labs & imaging results that were available during my care of the patient were reviewed by me and considered in my medical decision making (see chart for details).  Laura Camacho is a 55 y.o. female with history of hypertension,  hyperlipidemia, diabetes, GERD, who presents for evaluation of possibly 3 weeks of generalized weakness and fatigue as well as a near syncopal vs syncopal episode today. On exam, she is well-appearing and in no acute distress. Her vital signs are stable, she is afebrile. She is an intact neurological examination. I reviewed her labs, her creatinine is mildly elevated at 1.19, BUN is also mildly elevated at 21, we'll give IV fluids. CBC with mild leukocytosis, white blood cell count is 12.6 however this is nonspecific. Negative troponin. Urinalysis not consistent with infection. Chest x-ray clear. Nonspecific fatigue and weakness though this may be have been a vasovagal episode secondary to dehydration. No indication for CT head, no trauma, normal neurological examination is a purely neurogenic cause of syncope unlikely. No chest pain or palpitations makes cardiogenic syncope unlikely. We'll obtain d-dimer given that she is prescribed estrogen and pursue CTA chest to rule out PE only if d-dimer is elevated.  ----------------------------------------- 6:34 PM on 03/06/2016 ----------------------------------------- D-dimer is elevated at 890 and the patient will require a CT image of her chest to rule out PE, however, she has a history of allergic reaction to iodine stating that he gave her hives in the past patient denies any anaphylactic reaction. According to the new radiology premedication protocol, she will require a 4 hour prep. She has received hydrocortisone, we'll give Benadryl 1 hour prior to scan.  ----------------------------------------- 8:44 PM on 03/06/2016 ----------------------------------------- Awaiting CTA chest to rule out PE. If CTA negative, and the patient continues to appear well, anticipate that she can be discharged with close outpatient follow-up. Care transferred to Dr. Reita Cliche at this time.   ____________________________________________   FINAL CLINICAL IMPRESSION(S) / ED  DIAGNOSES  Final diagnoses:  Weakness  Near syncope      NEW MEDICATIONS STARTED DURING THIS VISIT:  New Prescriptions   No medications on file     Note:  This document was prepared using Dragon voice recognition software and may include unintentional dictation errors.    Joanne Gavel, MD 03/06/16 2045

## 2016-03-06 NOTE — ED Notes (Signed)
Patient transported to CT 

## 2016-03-06 NOTE — ED Notes (Signed)
Pt. Hit call bell, stated she felt heaviness in legs.  Pt. Has equal strength in both lower extremities and upper extremities.

## 2016-12-28 ENCOUNTER — Emergency Department
Admission: EM | Admit: 2016-12-28 | Discharge: 2016-12-28 | Disposition: A | Discharge status: Home / Self Care | Payer: Medicare HMO | Attending: Emergency Medicine | Admitting: Emergency Medicine

## 2016-12-28 ENCOUNTER — Encounter: Payer: Self-pay | Admitting: Emergency Medicine

## 2016-12-28 DIAGNOSIS — Z79899 Other long term (current) drug therapy: Secondary | ICD-10-CM | POA: Diagnosis not present

## 2016-12-28 DIAGNOSIS — F17200 Nicotine dependence, unspecified, uncomplicated: Secondary | ICD-10-CM | POA: Insufficient documentation

## 2016-12-28 DIAGNOSIS — G4489 Other headache syndrome: Secondary | ICD-10-CM

## 2016-12-28 DIAGNOSIS — I10 Essential (primary) hypertension: Secondary | ICD-10-CM

## 2016-12-28 DIAGNOSIS — E119 Type 2 diabetes mellitus without complications: Secondary | ICD-10-CM | POA: Diagnosis not present

## 2016-12-28 DIAGNOSIS — R51 Headache: Secondary | ICD-10-CM | POA: Diagnosis present

## 2016-12-28 LAB — COMPREHENSIVE METABOLIC PANEL
ALBUMIN: 3.8 g/dL (ref 3.5–5.0)
ALT: 29 U/L (ref 14–54)
ANION GAP: 10 (ref 5–15)
AST: 37 U/L (ref 15–41)
Alkaline Phosphatase: 65 U/L (ref 38–126)
BUN: 11 mg/dL (ref 6–20)
CHLORIDE: 104 mmol/L (ref 101–111)
CO2: 24 mmol/L (ref 22–32)
Calcium: 9.2 mg/dL (ref 8.9–10.3)
Creatinine, Ser: 1.21 mg/dL — ABNORMAL HIGH (ref 0.44–1.00)
GFR calc Af Amer: 57 mL/min — ABNORMAL LOW (ref >60)
GFR calc non Af Amer: 49 mL/min — ABNORMAL LOW (ref >60)
Glucose, Bld: 248 mg/dL — ABNORMAL HIGH (ref 65–99)
POTASSIUM: 3.5 mmol/L (ref 3.5–5.1)
SODIUM: 138 mmol/L (ref 135–145)
TOTAL PROTEIN: 7.7 g/dL (ref 6.5–8.1)
Total Bilirubin: 0.4 mg/dL (ref 0.3–1.2)

## 2016-12-28 LAB — CBC WITH DIFFERENTIAL/PLATELET
BASOS ABS: 0.1 10*3/uL (ref 0–0.1)
Basophils Relative: 1 %
EOS PCT: 2 %
Eosinophils Absolute: 0.2 10*3/uL (ref 0–0.7)
HEMATOCRIT: 42.2 % (ref 35.0–47.0)
Hemoglobin: 14.2 g/dL (ref 12.0–16.0)
LYMPHS ABS: 6.8 10*3/uL — AB (ref 1.0–3.6)
LYMPHS PCT: 53 %
MCH: 27.9 pg (ref 26.0–34.0)
MCHC: 33.6 g/dL (ref 32.0–36.0)
MCV: 83 fL (ref 80.0–100.0)
MONO ABS: 0.9 10*3/uL (ref 0.2–0.9)
Monocytes Relative: 7 %
NEUTROS ABS: 5 10*3/uL (ref 1.4–6.5)
Neutrophils Relative %: 39 %
Platelets: 331 10*3/uL (ref 150–440)
RBC: 5.09 MIL/uL (ref 3.80–5.20)
RDW: 13.7 % (ref 11.5–14.5)
WBC: 13.1 10*3/uL — ABNORMAL HIGH (ref 3.6–11.0)

## 2016-12-28 MED ORDER — ACETAMINOPHEN 500 MG PO TABS
1000.0000 mg | ORAL_TABLET | Freq: Once | ORAL | Status: AC
  Administered 2016-12-28: 1000 mg via ORAL
  Filled 2016-12-28: qty 2

## 2016-12-28 MED ORDER — IBUPROFEN 600 MG PO TABS
600.0000 mg | ORAL_TABLET | Freq: Once | ORAL | Status: AC
Start: 2016-12-28 — End: 2016-12-28
  Administered 2016-12-28: 600 mg via ORAL
  Filled 2016-12-28: qty 1

## 2016-12-28 NOTE — ED Triage Notes (Signed)
Per [facility], patient comes from home with c/o HTN. Hx of same. Patient was seen at PCP today. BP there was 220/100. PCP rx new blood pressure medication that patient has not filled yet. Patient c/o HA. Denies CP or SOB.

## 2016-12-28 NOTE — Discharge Instructions (Signed)
Please pick up a new antihypertensive medication tomorrow and take all of her medications as prescribed. Keep your appointment with her primary care physician in one week as scheduled. Return to the emergency department for any new or worsening symptoms such as fevers chills chest pain shortness of breath and numbness weakness worsening pain or any other concerns whatsoever.  It was a pleasure getting to take care of you today, thank you for coming to our emergency department.  Darel Hong, MD

## 2016-12-28 NOTE — ED Provider Notes (Signed)
[facility] Emergency Department Provider Note  ____________________________________________   First MD Initiated Contact with Patient 12/28/16 1939     (approximate)  I have reviewed the triage vital signs and the nursing notes.   HISTORY  Chief Complaint Hypertension    HPI Laura Camacho is a 56 y.o. female who comes to the emergency department via EMS for a feeling of "fluttering" over the right side of her head for the past several days. She has a long-standing history of hypertension for which she is currently taking 12.5 mg of hydrochlorothiazide, 0.2 mg of clonidine, and prazosin daily. Today she saw her primary care physician added a fourth unknown antihypertensive that she was unable to get filled today. She did not take her clonidine today.   Past Medical History:  Diagnosis Date  . Diabetes mellitus without complication ([place])   . GERD (gastroesophageal reflux disease)   . Hyperlipidemia   . Hypertension     There are no active problems to display for this patient.   Past Surgical History:  Procedure Laterality Date  . ABDOMINAL HYSTERECTOMY    . Bilateral Arm Surgery Bilateral    'tumors' removed  . COLON SURGERY    . COLONOSCOPY WITH PROPOFOL N/A 02/12/2016   Procedure: COLONOSCOPY WITH PROPOFOL;  Surgeon: Josefine Class, MD;  Location: [facility] ENDOSCOPY;  Service: Endoscopy;  Laterality: N/A;  . ESOPHAGOGASTRODUODENOSCOPY (EGD) WITH PROPOFOL N/A 02/12/2016   Procedure: ESOPHAGOGASTRODUODENOSCOPY (EGD) WITH PROPOFOL;  Surgeon: Josefine Class, MD;  Location: [facility] ENDOSCOPY;  Service: Endoscopy;  Laterality: N/A;    Prior to Admission medications   Medication Sig Start Date End Date Taking? Authorizing Provider  atorvastatin (LIPITOR) 20 MG tablet Take 20 mg by mouth daily.    Historical Provider, MD  dexlansoprazole (DEXILANT) 60 MG capsule Take 60 mg by mouth daily.    Historical Provider, MD  esterified estrogens  (MENEST) 0.625 MG tablet Take 0.625 mg by mouth daily.    Historical Provider, MD  gabapentin (NEURONTIN) 300 MG capsule Take 300 mg by mouth 2 (two) times daily.    Historical Provider, MD  meloxicam (MOBIC) 15 MG tablet Take 15 mg by mouth daily.    Historical Provider, MD  omeprazole (PRILOSEC) 40 MG capsule Take 40 mg by mouth daily.    Historical Provider, MD  ranitidine (ZANTAC) 150 MG capsule Take 150 mg by mouth 2 (two) times daily.    Historical Provider, MD  saxagliptin HCl (ONGLYZA) 5 MG TABS tablet Take 5 mg by mouth daily.    Historical Provider, MD  terazosin (HYTRIN) 5 MG capsule Take 5 mg by mouth at bedtime.    Historical Provider, MD    Allergies Codeine; Bupropion; Cefuroxime; Ciprofloxacin; Fluconazole; Ibuprofen; Iodine; Ivp dye [iodinated diagnostic agents]; Metformin and related; Nizoral [ketoconazole]; Sulfa antibiotics; and Voltaren [diclofenac sodium]  No family history on file.  Social History Social History  Substance Use Topics  . Smoking status: Current Every Day Smoker  . Smokeless tobacco: Never Used  . Alcohol use No    Review of Systems Constitutional: No fever/chills Eyes: No visual changes. ENT: No sore throat. Cardiovascular: Denies chest pain. Respiratory: Denies shortness of breath. Gastrointestinal: No abdominal pain.  No nausea, no vomiting.  No diarrhea.  No constipation. Genitourinary: Negative for dysuria. Musculoskeletal: Negative for back pain. Skin: Negative for rash. Neurological: Positive for headaches, negative for focal weakness or numbness.  10-point ROS otherwise negative.  ____________________________________________   PHYSICAL EXAM:  VITAL SIGNS: ED  Triage Vitals  Enc Vitals Group     BP 12/28/16 1926 (!) 166/68     Pulse Rate 12/28/16 1926 73     Resp 12/28/16 1926 17     Temp 12/28/16 1927 97.8 F (36.6 C)     Temp Source 12/28/16 1927 Oral     SpO2 12/28/16 1926 98 %     Weight 12/28/16 1927 205 lb (93 kg)      Height 12/28/16 1927 5\' 2"  (1.575 m)     Head Circumference --      Peak Flow --      Pain Score 12/28/16 1927 9     Pain Loc --      Pain Edu? --      Excl. in [place]? --     Constitutional: Alert and oriented. Well appearing and in no acute distress. Eyes: Conjunctivae are normal. PERRL. EOMI. Head: Atraumatic. Nose: No congestion/rhinnorhea. Mouth/Throat: Mucous membranes are moist.  Oropharynx non-erythematous. Neck: No stridor.   Cardiovascular: Normal rate, regular rhythm. Grossly normal heart sounds.  Good peripheral circulation. Respiratory: Normal respiratory effort.  No retractions. Lungs CTAB. Gastrointestinal: Soft and nontender. No distention. No abdominal bruits. No CVA tenderness. Musculoskeletal: No lower extremity tenderness nor edema.  No joint effusions. Neurologic:  Normal speech and language. No gross focal neurologic deficits are appreciated. No gait instability. Skin:  Skin is warm, dry and intact. No rash noted. Psychiatric: Mood and affect are normal. Speech and behavior are normal.  ____________________________________________   LABS (all labs ordered are listed, but only abnormal results are displayed)  Labs Reviewed  COMPREHENSIVE METABOLIC PANEL - Abnormal; Notable for the following:       Result Value   Glucose, Bld 248 (*)    Creatinine, Ser 1.21 (*)    GFR calc non Af Amer 49 (*)    GFR calc Af Amer 57 (*)    All other components within normal limits  CBC WITH DIFFERENTIAL/PLATELET - Abnormal; Notable for the following:    WBC 13.1 (*)    Lymphs Abs 6.8 (*)    All other components within normal limits   ____________________________________________  EKG   ____________________________________________  RADIOLOGY   ____________________________________________   PROCEDURES  Procedure(s) performed: no  Procedures  Critical Care performed: no  ____________________________________________   INITIAL IMPRESSION / ASSESSMENT AND PLAN  / ED COURSE  Pertinent labs & imaging results that were available during my care of the patient were reviewed by me and considered in my medical decision making (see chart for details).      ----------------------------------------- 8:50 PM on 12/28/2016 -----------------------------------------  After Tylenol and Motrin the patient's pain is nearly resolved and her blood pressures come down to 150s over 60s. She feels comfortable going home and keeping her follow-up in one week as scheduled. She is medically stable for outpatient management.  ____________________________________________   FINAL CLINICAL IMPRESSION(S) / ED DIAGNOSES  Final diagnoses:  Hypertension, unspecified type  Other headache syndrome      NEW MEDICATIONS STARTED DURING THIS VISIT:  Discharge Medication List as of 12/28/2016  8:49 PM       Note:  This document was prepared using Dragon voice recognition software and may include unintentional dictation errors.     Darel Hong, MD 12/31/16 1245

## 2017-05-03 ENCOUNTER — Encounter: Payer: Self-pay | Admitting: Obstetrics and Gynecology

## 2017-05-05 ENCOUNTER — Ambulatory Visit (INDEPENDENT_AMBULATORY_CARE_PROVIDER_SITE_OTHER): Payer: Medicare HMO | Admitting: Obstetrics and Gynecology

## 2017-05-05 ENCOUNTER — Encounter: Payer: Self-pay | Admitting: Obstetrics and Gynecology

## 2017-05-05 VITALS — BP 134/64 | HR 54 | Ht 61.0 in | Wt 200.1 lb

## 2017-05-05 DIAGNOSIS — N904 Leukoplakia of vulva: Secondary | ICD-10-CM | POA: Diagnosis not present

## 2017-05-05 DIAGNOSIS — Z780 Asymptomatic menopausal state: Secondary | ICD-10-CM | POA: Insufficient documentation

## 2017-05-05 DIAGNOSIS — I10 Essential (primary) hypertension: Secondary | ICD-10-CM | POA: Insufficient documentation

## 2017-05-05 DIAGNOSIS — Z90710 Acquired absence of both cervix and uterus: Secondary | ICD-10-CM | POA: Insufficient documentation

## 2017-05-05 DIAGNOSIS — Q8501 Neurofibromatosis, type 1: Secondary | ICD-10-CM | POA: Diagnosis not present

## 2017-05-05 DIAGNOSIS — N766 Ulceration of vulva: Secondary | ICD-10-CM | POA: Diagnosis not present

## 2017-05-05 DIAGNOSIS — N909 Noninflammatory disorder of vulva and perineum, unspecified: Secondary | ICD-10-CM

## 2017-05-05 DIAGNOSIS — E669 Obesity, unspecified: Secondary | ICD-10-CM | POA: Insufficient documentation

## 2017-05-05 DIAGNOSIS — E785 Hyperlipidemia, unspecified: Secondary | ICD-10-CM

## 2017-05-05 DIAGNOSIS — R638 Other symptoms and signs concerning food and fluid intake: Secondary | ICD-10-CM | POA: Insufficient documentation

## 2017-05-05 DIAGNOSIS — N9089 Other specified noninflammatory disorders of vulva and perineum: Secondary | ICD-10-CM

## 2017-05-05 DIAGNOSIS — K219 Gastro-esophageal reflux disease without esophagitis: Secondary | ICD-10-CM | POA: Insufficient documentation

## 2017-05-05 DIAGNOSIS — G894 Chronic pain syndrome: Secondary | ICD-10-CM | POA: Insufficient documentation

## 2017-05-05 HISTORY — DX: Hyperlipidemia, unspecified: E78.5

## 2017-05-05 NOTE — Progress Notes (Signed)
GYN ENCOUNTER NOTE  Subjective:       Laura Camacho is a 56 y.o. [phone] female is here for gynecologic evaluation of the following issues:  1. Perineumpain/bleeding   Patient presents for evaluation of a "bump" between her vagina and rectum which has been present for two weeks. She states she was getting up to use the restroom and her left side gave out. She fell and stratted the bed frame. She endorses some bleeding but is more concerned about the pain. Rates pain at 7/10. She endorses "some" painful urination. Denies increased frequency or urgency. BM are normal for her and are normally painful due to abdominal surgery she had several years.   Gynecologic History No LMP recorded. Patient is postmenopausal. Contraception: status post hysterectomy Last Pap: UNK Last mammogram: UNK  Obstetric History OB History  Gravida Para Term Preterm AB Living  4 3 1 2 1 1   SAB TAB Ectopic Multiple Live Births  1       3    # Outcome Date GA Lbr Len/2nd Weight Sex Delivery Anes PTL Lv  4 SAB 1982          3 Preterm 1980     Vag-Spont   ND  2 Term 64    F Vag-Spont   LIV  1 Preterm 51    M    DEC      Past Medical History:  Diagnosis Date  . Diabetes mellitus without complication ([place])   . GERD (gastroesophageal reflux disease)   . Hyperlipidemia   . Hypertension    Past Surgical History:  Procedure Laterality Date  . ABDOMINAL HYSTERECTOMY     has both ovaries- [place]  . Bilateral Arm Surgery Bilateral    'tumors' removed  . COLON SURGERY    . COLONOSCOPY WITH PROPOFOL N/A 02/12/2016   Procedure: COLONOSCOPY WITH PROPOFOL;  Surgeon: Josefine Class, MD;  Location: [facility] ENDOSCOPY;  Service: Endoscopy;  Laterality: N/A;  . ESOPHAGOGASTRODUODENOSCOPY (EGD) WITH PROPOFOL N/A 02/12/2016   Procedure: ESOPHAGOGASTRODUODENOSCOPY (EGD) WITH PROPOFOL;  Surgeon: Josefine Class, MD;  Location: [facility] ENDOSCOPY;  Service: Endoscopy;  Laterality: N/A;   Current Outpatient  Prescriptions on File Prior to Visit  Medication Sig Dispense Refill  . atorvastatin (LIPITOR) 20 MG tablet Take 20 mg by mouth daily.    Marland Kitchen dexlansoprazole (DEXILANT) 60 MG capsule Take 60 mg by mouth daily.    Marland Kitchen esterified estrogens (MENEST) 0.625 MG tablet Take 0.625 mg by mouth daily.    Marland Kitchen gabapentin (NEURONTIN) 300 MG capsule Take 300 mg by mouth 2 (two) times daily.    . meloxicam (MOBIC) 15 MG tablet Take 15 mg by mouth daily.    Marland Kitchen omeprazole (PRILOSEC) 40 MG capsule Take 40 mg by mouth daily.    . ranitidine (ZANTAC) 150 MG capsule Take 150 mg by mouth 2 (two) times daily.    . saxagliptin HCl (ONGLYZA) 5 MG TABS tablet Take 5 mg by mouth daily.     No current facility-administered medications on file prior to visit.    The following portions of the patient's history were reviewed and updated as appropriate: allergies, current medications, past family history, past medical history, past social history, past surgical history and problem list.  Review of Systems Review of Systems  Genitourinary: Positive for dysuria. Negative for flank pain, frequency, hematuria and urgency.  Skin: Positive for itching.       Labia majora  All other systems reviewed and are negative.  Objective:   BP 134/64   Pulse (!) 54   Ht 5\' 1"  (1.549 m)   Wt 200 lb 1.6 oz (90.8 kg)   BMI 37.81 kg/m  CONSTITUTIONAL: Well-developed, well-nourished female in no acute distress.  HENT:  Normocephalic, atraumatic.  NECK: Normal range of motion, supple, no masses.  SKIN: Skin is warm and dry. Not diaphoretic. No erythema.  [place]: Alert and oriented to person, place, and time.  PSYCHIATRIC: Normal mood and affect. Normal behavior. Normal judgment, mild cognitive impairment. CARDIOVASCULAR:Not Examined  RESPIRATORY: Not Examined BREASTS: Not Examined ABDOMEN: Soft, non distended; Non tender.  No Organomegaly. PELVIC:  External Genitalia: Vulvar mass located in the posterior fourchette, firm, mobile.  Lichen sclerosis appearing patching areas on the labia majora and parts of the minora.   BUS: Normal  Vagina: Not assessed   Cervix: Not assessed  Uterus: Surgically absent   Adnexa: Not assessed   RV: External rectum normal appearing, no hemorids or lesions noted   PROCEDURE:  Skin Biopsy of left labia majora  Area prepped with betadine 3cc lido injected into the left labia major 45mm biopsy taken 4-0 XXXX suture placed to control bleeding Area clean and dry at completion  EBL-minimal Complications-none Return in 2 weeks for wound check and resutls   Assessment:   1. Vulvar mass; 2x3 cm posterior fourchette nodule, firm, mobile  2. Vulvar ulcer; <55mm ulcer located on hymenal skin tag 3. 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 Status post hysterectomy 6. Menopause  7. Increased BMI  Plan:  1. Vulvar mass; Schedule for same day surgical removal  2. Vulvar ulcer; Culture taken 3. Vulvar leukoplakia; 28mm biopsy taken  4. Return in 2 weeks for follow-up  Carlene Coria, Student PA [facility]  Brayton Mars, MD   I have seen, interviewed, and examined the patient in conjunction with the [facility].A. student and affirm the diagnosis and management plan. Rease Swinson A. Roshawnda Pecora, MD, FACOG   Note: This dictation was prepared with Dragon dictation along with smaller phrase technology. Any transcriptional errors that result from this process are unintentional.

## 2017-05-05 NOTE — Patient Instructions (Signed)
1. Vulvar biopsy is done today 2. Culture of vulvar ulcer is obtained today 3. Excision of vulvar mass is scheduled in the operating room for early August 2018 4. Return for preop appointment the week before surgery 5. Return in 2 weeks for follow-up on vulvar biopsy  VULVAR BIOPSY POST-PROCEDURE INSTRUCTIONS  1. You may take Ibuprofen, Aleve or Tylenol for pain if needed.    2. You may have a small amount of spotting.  You should wear a mini pad for the next few days.  3. You may use some topical Neosporin ointment if you would like (over the counter is fine).  4. You need to call if you have redness around the biopsy site, if there is any unusual draining, if the bleeding is heavy, or if you are concerned.  5. Shower or bathe as normal  6. We will discuss the results at your follow-up appointment in 2 weeks.

## 2017-05-08 LAB — HERPES SIMPLEX VIRUS CULTURE

## 2017-05-11 LAB — PATHOLOGY

## 2017-05-23 ENCOUNTER — Ambulatory Visit (INDEPENDENT_AMBULATORY_CARE_PROVIDER_SITE_OTHER): Payer: Medicare HMO | Admitting: Obstetrics and Gynecology

## 2017-05-23 ENCOUNTER — Encounter: Payer: Self-pay | Admitting: Obstetrics and Gynecology

## 2017-05-23 VITALS — BP 173/61 | HR 65 | Ht 61.0 in | Wt 198.0 lb

## 2017-05-23 DIAGNOSIS — B373 Candidiasis of vulva and vagina: Secondary | ICD-10-CM | POA: Insufficient documentation

## 2017-05-23 DIAGNOSIS — N766 Ulceration of vulva: Secondary | ICD-10-CM

## 2017-05-23 DIAGNOSIS — N9089 Other specified noninflammatory disorders of vulva and perineum: Secondary | ICD-10-CM

## 2017-05-23 DIAGNOSIS — Q8501 Neurofibromatosis, type 1: Secondary | ICD-10-CM

## 2017-05-23 DIAGNOSIS — N909 Noninflammatory disorder of vulva and perineum, unspecified: Secondary | ICD-10-CM

## 2017-05-23 DIAGNOSIS — B3731 Acute candidiasis of vulva and vagina: Secondary | ICD-10-CM | POA: Insufficient documentation

## 2017-05-23 MED ORDER — NYSTATIN-TRIAMCINOLONE 100000-0.1 UNIT/GM-% EX CREA: 1.0000 "application " | TOPICAL_CREAM | Freq: Two times a day (BID) | CUTANEOUS | 3 refills | Status: DC

## 2017-05-23 NOTE — Patient Instructions (Signed)
1. Use nystatin/triamcinolone cream twice a day for 30 days 2. Return in 4 weeks for recheck 3. Return for preop appointment prior to surgery scheduled for 06/26/2017-wide local excision of vulvar nodule

## 2017-05-23 NOTE — Progress Notes (Signed)
Chief complaint: 1. Vulvar mass 2. Vulvar ulcer 3. Vulvar leukoplakia  Patient presents for follow-up after vulvar biopsy last week. 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 Herpes culture was negative.  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 This lesion is to large to remove in the office.  Past medical history, past surgical history, problem list, medications, and allergies are reviewed  OBJECTIVE: BP (!) 173/61   Pulse 65   Ht 5\' 1"  (1.549 m)   Wt 198 lb (89.8 kg)   BMI 37.41 kg/m  Pleasant female in no acute distress Pelvic exam: PELVIC:             External Genitalia: Vulvar mass located in the posterior fourchette, firm, mobile 3 x 4 cm. Leukoplakia of labia majora, bilateral. Vulvar biopsy site, healed  BUS: Normal             Vagina: Not assessed              Cervix: Not assessed             Uterus: Surgically absent              Adnexa: Not assessed              RV: External rectum normal appearing, no hemorids or lesions noted  ASSESSMENT: 1. Chronic Monilia  Vulvovaginitis 2. 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 Nystatin/triamcinolone cream topically twice a day for 4 weeks 2. Return in 4 weeks for recheck 3. Schedule wide local excision of vulvar mass in same day surgery for 06/26/2017 4. Return for preop appointment 1 week prior surgery  A total of 15 minutes were spent face-to-face with the patient during this encounter and over half of that time dealt with counseling and coordination of care.  Brayton Mars, MD  Note: This dictation was prepared with Dragon dictation along with smaller phrase technology. Any transcriptional errors that result from this process are unintentional.

## 2017-06-22 ENCOUNTER — Encounter
Admission: RE | Admit: 2017-06-22 | Discharge: 2017-06-22 | Disposition: A | Discharge status: Home / Self Care | Payer: Medicare HMO | Source: Ambulatory Visit | Attending: Obstetrics and Gynecology | Admitting: Obstetrics and Gynecology

## 2017-06-22 ENCOUNTER — Encounter: Payer: Self-pay | Admitting: Obstetrics and Gynecology

## 2017-06-22 ENCOUNTER — Ambulatory Visit (INDEPENDENT_AMBULATORY_CARE_PROVIDER_SITE_OTHER): Payer: Medicare HMO | Admitting: Obstetrics and Gynecology

## 2017-06-22 VITALS — BP 188/71 | HR 51 | Ht 61.0 in | Wt 199.0 lb

## 2017-06-22 DIAGNOSIS — Z01818 Encounter for other preprocedural examination: Secondary | ICD-10-CM | POA: Insufficient documentation

## 2017-06-22 DIAGNOSIS — N909 Noninflammatory disorder of vulva and perineum, unspecified: Secondary | ICD-10-CM | POA: Insufficient documentation

## 2017-06-22 DIAGNOSIS — R001 Bradycardia, unspecified: Secondary | ICD-10-CM | POA: Diagnosis not present

## 2017-06-22 DIAGNOSIS — I10 Essential (primary) hypertension: Secondary | ICD-10-CM | POA: Diagnosis not present

## 2017-06-22 DIAGNOSIS — N9089 Other specified noninflammatory disorders of vulva and perineum: Secondary | ICD-10-CM

## 2017-06-22 DIAGNOSIS — Z0183 Encounter for blood typing: Secondary | ICD-10-CM | POA: Diagnosis not present

## 2017-06-22 DIAGNOSIS — Z01812 Encounter for preprocedural laboratory examination: Secondary | ICD-10-CM | POA: Insufficient documentation

## 2017-06-22 HISTORY — DX: Fibromyalgia: M79.7

## 2017-06-22 HISTORY — DX: Unspecified glaucoma: H40.9

## 2017-06-22 HISTORY — DX: Headache: R51

## 2017-06-22 HISTORY — DX: Adverse effect of unspecified anesthetic, initial encounter: T41.45XA

## 2017-06-22 HISTORY — DX: Headache, unspecified: R51.9

## 2017-06-22 HISTORY — DX: Other complications of anesthesia, initial encounter: T88.59XA

## 2017-06-22 HISTORY — DX: Neurofibromatosis, unspecified: Q85.00

## 2017-06-22 LAB — CBC WITH DIFFERENTIAL/PLATELET
BASOS ABS: 0.1 10*3/uL (ref 0–0.1)
BASOS PCT: 1 %
EOS PCT: 2 %
Eosinophils Absolute: 0.2 10*3/uL (ref 0–0.7)
HCT: 43.7 % (ref 35.0–47.0)
Hemoglobin: 14.9 g/dL (ref 12.0–16.0)
LYMPHS PCT: 53 %
Lymphs Abs: 5.5 10*3/uL — ABNORMAL HIGH (ref 1.0–3.6)
MCH: 27.9 pg (ref 26.0–34.0)
MCHC: 34.2 g/dL (ref 32.0–36.0)
MCV: 81.7 fL (ref 80.0–100.0)
MONO ABS: 0.5 10*3/uL (ref 0.2–0.9)
Monocytes Relative: 5 %
Neutro Abs: 4 10*3/uL (ref 1.4–6.5)
Neutrophils Relative %: 39 %
PLATELETS: 316 10*3/uL (ref 150–440)
RBC: 5.35 MIL/uL — AB (ref 3.80–5.20)
RDW: 13.8 % (ref 11.5–14.5)
WBC: 10.3 10*3/uL (ref 3.6–11.0)

## 2017-06-22 LAB — RAPID HIV SCREEN (HIV 1/2 AB+AG)
HIV 1/2 ANTIBODIES: NONREACTIVE
HIV-1 P24 ANTIGEN - HIV24: NONREACTIVE

## 2017-06-22 LAB — TYPE AND SCREEN
ABO/RH(D): B POS
Antibody Screen: NEGATIVE

## 2017-06-22 NOTE — Pre-Procedure Instructions (Addendum)
+   DISCUSSED NEUROFIBROMATOSIS WITH DR A PENWARDEN ,ANESTHESIOLOGIST AND OK TO PROCEED.   ALSO STATED TOLD NEVER TO TAKE UNKNOWN ANESTEHESIA AFTER NECK FUSION OR HYSTERECTOMY UNSURE WHICH

## 2017-06-22 NOTE — Progress Notes (Signed)
Subjective:  PREOP HISTORY AND PHYSICAL   Date of surgery: 06/26/2017 Diagnosis: 4 cm posterior fourchette S Procedure: Wide local excision of vulvar mass    Patient is a 56 y.o. [id number] scheduled for surgery on 06/26/2017  Pt has neurofibromatosis. A vulvar nodule is possibly a neurofibroma at the posterior fourchette. This lesion is too large to remove in the office. Gynecologic History No LMP recorded. Patient is postmenopausal. Contraception: status post hysterectomy Last Pap: UNK Last mammogram: UNK  OB History    Gravida Para Term Preterm AB Living   4 3 1 2 1 1    SAB TAB Ectopic Multiple Live Births   1       3     No LMP recorded. Patient is postmenopausal.    Past Medical History:  Diagnosis Date  . Diabetes mellitus without complication ([place])   . GERD (gastroesophageal reflux disease)   . Hyperlipidemia   . Hypertension     Past Surgical History:  Procedure Laterality Date  . ABDOMINAL HYSTERECTOMY     has both ovaries- [facility]  . Bilateral Arm Surgery Bilateral    'tumors' removed  . COLON SURGERY    . COLONOSCOPY WITH PROPOFOL N/A 02/12/2016   Procedure: COLONOSCOPY WITH PROPOFOL;  Surgeon: Josefine Class, MD;  Location: [facility] ENDOSCOPY;  Service: Endoscopy;  Laterality: N/A;  . ESOPHAGOGASTRODUODENOSCOPY (EGD) WITH PROPOFOL N/A 02/12/2016   Procedure: ESOPHAGOGASTRODUODENOSCOPY (EGD) WITH PROPOFOL;  Surgeon: Josefine Class, MD;  Location: [facility] ENDOSCOPY;  Service: Endoscopy;  Laterality: N/A;    OB History  Gravida Para Term Preterm AB Living  4 3 1 2 1 1   SAB TAB Ectopic Multiple Live Births  1       3    # Outcome Date GA Lbr Len/2nd Weight Sex Delivery Anes PTL Lv  4 SAB 1982          3 Preterm 1980     Vag-Spont   ND  2 Term 64    F Vag-Spont   LIV  1 Preterm 67    M    DEC      Social History   Social History  . Marital status: Single    Spouse name: N/A  . Number of children: N/A  . Years of education: N/A    Social History Main Topics  . Smoking status: Current Every Day Smoker    Packs/day: 0.50    Types: Cigarettes  . Smokeless tobacco: Never Used  . Alcohol use Yes     Comment: once a week  . Drug use: Yes    Types: Marijuana     Comment: monthly  . Sexual activity: Yes    Birth control/ protection: Surgical   Other Topics Concern  . None   Social History Narrative  . None    Family History  Problem Relation Age of Onset  . Diabetes Mother   . Diabetes Father   . Diabetes Maternal Aunt   . Diabetes Maternal Uncle   . Breast cancer Neg Hx   . Ovarian cancer Neg Hx   . Colon cancer Neg Hx      (Not in a hospital admission)  Allergies  Allergen Reactions  . Codeine Anaphylaxis    "Throat swells up"  . Bupropion     Unknown  . Fluconazole Itching  . Ibuprofen Itching  . Iodine Hives  . Ivp Dye [Iodinated Diagnostic Agents] Hives  . Metformin And Related Itching  . Nizoral [Ketoconazole] Other (See  Comments)    Dizzy/ Fainting  . Sulfa Antibiotics Hives  . Voltaren [Diclofenac Sodium] Diarrhea and Nausea And Vomiting  . Cefuroxime Rash  . Ciprofloxacin Rash    Review of Systems Constitutional: No recent fever/chills/sweats Respiratory: No recent cough/bronchitis Cardiovascular: No chest pain Gastrointestinal: No recent nausea/vomiting/diarrhea Genitourinary: No UTI symptoms Hematologic/lymphatic:No history of coagulopathy or recent blood thinner use    Objective:    BP (!) 188/71   Pulse (!) 51   Ht 5\' 1"  (1.549 m)   Wt 199 lb (90.3 kg)   BMI 37.60 kg/m   General:   Normal  Skin:   normal  HEENT:  Normal  Neck:  Supple without Adenopathy or Thyromegaly  Lungs:   Heart:              Breasts:   Abdomen:  Pelvis:  M/S   Extremeties:  Neuro:    clear to auscultation bilaterally   Normal without murmur   Not Examined   soft, non-tender; bowel sounds normal; no masses,  no organomegaly   Exam deferred to OR  No CVAT  Warm/Dry   Normal         05/05/2017 PELVIC:             External Genitalia: Vulvar mass located in the posterior fourchette, firm, mobile. Lichen sclerosis appearing patching areas on the labia majora and parts of the minora.              BUS: Normal             Vagina: Not assessed              Cervix: Not assessed             Uterus: Surgically absent              Adnexa: Not assessed              RV: External rectum normal appearing, no hemorids or lesions noted   Assessment:    4 cm vulvar mass and posterior fourchette, likely neurofibroma   Plan:  Wide local excision of vulvar mass   preop counseling: The patient is to undergo wide local excision of a 4 cm vulvar mass at the posterior fourchette on 2018. She is understanding of the planned procedure and is aware of and is accepting of all surgical risks which include but are not limited to bleeding, infection, pelvic organ injury with need for repair, blood clot disorders, anesthesia risks, etc. All questions have been answered. Informed consent is given. Patient is ready and willing to proceed with surgery as scheduled.  Brayton Mars, MD  Note: This dictation was prepared with Dragon dictation along with smaller phrase technology. Any transcriptional errors that result from this process are unintentional.

## 2017-06-22 NOTE — H&P (Signed)
Subjective:  PREOP HISTORY AND PHYSICAL   Date of surgery: 06/26/2017 Diagnosis: 4 cm posterior fourchette S Procedure: Wide local excision of vulvar mass    Patient is a 56 y.o. G4P1264female scheduled for surgery on 06/26/2017  Pt has neurofibromatosis. A vulvar nodule is possibly a neurofibroma at the posterior fourchette. This lesion is too large to remove in the office. Gynecologic History No LMP recorded. Patient is postmenopausal. Contraception: status post hysterectomy Last Pap: UNK Last mammogram: UNK  OB History    Gravida Para Term Preterm AB Living   4 3 1 2 1 1    SAB TAB Ectopic Multiple Live Births   1       3     No LMP recorded. Patient is postmenopausal.    Past Medical History:  Diagnosis Date  . Diabetes mellitus without complication ([place])   . GERD (gastroesophageal reflux disease)   . Hyperlipidemia   . Hypertension     Past Surgical History:  Procedure Laterality Date  . ABDOMINAL HYSTERECTOMY     has both ovaries- [facility]  . Bilateral Arm Surgery Bilateral    'tumors' removed  . COLON SURGERY    . COLONOSCOPY WITH PROPOFOL N/A 02/12/2016   Procedure: COLONOSCOPY WITH PROPOFOL;  Surgeon: Josefine Class, MD;  Location: [facility] ENDOSCOPY;  Service: Endoscopy;  Laterality: N/A;  . ESOPHAGOGASTRODUODENOSCOPY (EGD) WITH PROPOFOL N/A 02/12/2016   Procedure: ESOPHAGOGASTRODUODENOSCOPY (EGD) WITH PROPOFOL;  Surgeon: Josefine Class, MD;  Location: [facility] ENDOSCOPY;  Service: Endoscopy;  Laterality: N/A;    OB History  Gravida Para Term Preterm AB Living  4 3 1 2 1 1   SAB TAB Ectopic Multiple Live Births  1       3    # Outcome Date GA Lbr Len/2nd Weight Sex Delivery Anes PTL Lv  4 SAB 1982          3 Preterm 1980     Vag-Spont   ND  2 Term 46    F Vag-Spont   LIV  1 Preterm 39    M    DEC      Social History   Social History  . Marital status: Single    Spouse name: N/A  . Number of children: N/A  . Years of education: N/A    Social History Main Topics  . Smoking status: Current Every Day Smoker    Packs/day: 0.50    Types: Cigarettes  . Smokeless tobacco: Never Used  . Alcohol use Yes     Comment: once a week  . Drug use: Yes    Types: Marijuana     Comment: monthly  . Sexual activity: Yes    Birth control/ protection: Surgical   Other Topics Concern  . None   Social History Narrative  . None    Family History  Problem Relation Age of Onset  . Diabetes Mother   . Diabetes Father   . Diabetes Maternal Aunt   . Diabetes Maternal Uncle   . Breast cancer Neg Hx   . Ovarian cancer Neg Hx   . Colon cancer Neg Hx      (Not in a hospital admission)  Allergies  Allergen Reactions  . Codeine Anaphylaxis    "Throat swells up"  . Bupropion     Unknown  . Fluconazole Itching  . Ibuprofen Itching  . Iodine Hives  . Ivp Dye [Iodinated Diagnostic Agents] Hives  . Metformin And Related Itching  . Nizoral [Ketoconazole] Other (See  Comments)    Dizzy/ Fainting  . Sulfa Antibiotics Hives  . Voltaren [Diclofenac Sodium] Diarrhea and Nausea And Vomiting  . Cefuroxime Rash  . Ciprofloxacin Rash    Review of Systems Constitutional: No recent fever/chills/sweats Respiratory: No recent cough/bronchitis Cardiovascular: No chest pain Gastrointestinal: No recent nausea/vomiting/diarrhea Genitourinary: No UTI symptoms Hematologic/lymphatic:No history of coagulopathy or recent blood thinner use    Objective:    BP (!) 188/71   Pulse (!) 51   Ht 5\' 1"  (1.549 m)   Wt 199 lb (90.3 kg)   BMI 37.60 kg/m   General:   Normal  Skin:   normal  HEENT:  Normal  Neck:  Supple without Adenopathy or Thyromegaly  Lungs:   Heart:              Breasts:   Abdomen:  Pelvis:  M/S   Extremeties:  Neuro:    clear to auscultation bilaterally   Normal without murmur   Not Examined   soft, non-tender; bowel sounds normal; no masses,  no organomegaly   Exam deferred to OR  No CVAT  Warm/Dry   Normal         05/05/2017 PELVIC:             External Genitalia: Vulvar mass located in the posterior fourchette, firm, mobile. Lichen sclerosis appearing patching areas on the labia majora and parts of the minora.              BUS: Normal             Vagina: Not assessed              Cervix: Not assessed             Uterus: Surgically absent              Adnexa: Not assessed              RV: External rectum normal appearing, no hemorids or lesions noted   Assessment:    4 cm vulvar mass and posterior fourchette, likely neurofibroma   Plan:  Wide local excision of vulvar mass   preop counseling: The patient is to undergo wide local excision of a 4 cm vulvar mass at the posterior fourchette on 2018. She is understanding of the planned procedure and is aware of and is accepting of all surgical risks which include but are not limited to bleeding, infection, pelvic organ injury with need for repair, blood clot disorders, anesthesia risks, etc. All questions have been answered. Informed consent is given. Patient is ready and willing to proceed with surgery as scheduled.  Brayton Mars, MD  Note: This dictation was prepared with Dragon dictation along with smaller phrase technology. Any transcriptional errors that result from this process are unintentional.

## 2017-06-22 NOTE — Patient Instructions (Signed)
1. Return on 07/05/2017 for postop check

## 2017-06-22 NOTE — Patient Instructions (Signed)
  Your procedure is scheduled on: June 26, 2017 [facility] ) Report to Same Day Surgery 2nd floor medical mall ([place] Entrance-take elevator on left to 2nd floor.  Check in with surgery information desk.) To find out your arrival time please call [phone] between 1PM - 3PM on June 23, 2017 (FRIDAY )  Remember: Instructions that are not followed completely may result in serious medical risk, up to and including death, or upon the discretion of your surgeon and anesthesiologist your surgery may need to be rescheduled.    _x___ 1. Do not eat food or drink liquids after midnight. No gum chewing or  hard candies                        __x__ 2. No Alcohol for 24 hours before or after surgery.   __x__3. No Smoking for 24 prior to surgery.   ____  4. Bring all medications with you on the day of surgery if instructed.    __x__ 5. Notify your doctor if there is any change in your medical condition     (cold, fever, infections).     Do not wear jewelry, make-up, hairpins, clips or nail polish.  Do not wear lotions, powders, or perfumes.  Do not shave 48 hours prior to surgery. Men may shave face and neck.  Do not bring valuables to the hospital.    [facility] is not responsible for any belongings or valuables.               Contacts, dentures or bridgework may not be worn into surgery.  Leave your suitcase in the car. After surgery it may be brought to your room.  For patients admitted to the hospital, discharge time is determined by your treatment team                        Patients discharged the day of surgery will not be allowed to drive home.  You will need someone to drive you home and stay with you the night of your procedure.    Please read over the following fact sheets that you were given:   [facility] Preparing for Surgery and or MRSA Information   TAKE THE FOLLOWING MEDICATIONS THE MORNING OF SURGERY WITH A SIP OF WATER :  1. DEXILANT  2. BYSTOLIC  3.  LIPITOR  4.  5.  6.  ____Fleets enema or Magnesium Citrate as directed.   _x___ Use CHG Soap or sage wipes as directed on instruction sheet   ____ Use inhalers on the day of surgery and bring to hospital day of surgery  ____ Stop Metformin and Janumet 2 days prior to surgery.    __X__ Take 1/2 of usual insulin dose the night before surgery and none on the morning  Surgery ( NO INSULIN MONDAY MORNING)  _x___ Follow recommendations from Cardiologist, Pulmonologist or PCP regarding          stopping Aspirin, Coumadin, Plavix ,Eliquis, Effient, or Pradaxa, and Pletal.  X____Stop Anti-inflammatories such as Advil, Aleve, Ibuprofen, Motrin, Naproxen, Naprosyn, Goodies powders or aspirin products. OK to take Tylenol   _x___ Stop supplements until after surgery.  But may continue Vitamin D, Vitamin B, and multivitamin        ____ Bring C-Pap to the hospital.

## 2017-06-23 LAB — RPR: RPR Ser Ql: NONREACTIVE

## 2017-06-26 ENCOUNTER — Ambulatory Visit: Payer: Medicare HMO | Admitting: Anesthesiology

## 2017-06-26 ENCOUNTER — Encounter
Admission: RE | Disposition: A | Discharge status: Home / Self Care | Payer: Self-pay | Source: Ambulatory Visit | Attending: Obstetrics and Gynecology

## 2017-06-26 ENCOUNTER — Ambulatory Visit
Admission: RE | Admit: 2017-06-26 | Discharge: 2017-06-26 | Disposition: A | Discharge status: Home / Self Care | Payer: Medicare HMO | Source: Ambulatory Visit | Attending: Obstetrics and Gynecology | Admitting: Obstetrics and Gynecology

## 2017-06-26 ENCOUNTER — Encounter: Payer: Self-pay | Admitting: *Deleted

## 2017-06-26 DIAGNOSIS — M797 Fibromyalgia: Secondary | ICD-10-CM | POA: Insufficient documentation

## 2017-06-26 DIAGNOSIS — E119 Type 2 diabetes mellitus without complications: Secondary | ICD-10-CM | POA: Diagnosis not present

## 2017-06-26 DIAGNOSIS — N762 Acute vulvitis: Secondary | ICD-10-CM

## 2017-06-26 DIAGNOSIS — Q8500 Neurofibromatosis, unspecified: Secondary | ICD-10-CM | POA: Diagnosis present

## 2017-06-26 DIAGNOSIS — Z79899 Other long term (current) drug therapy: Secondary | ICD-10-CM | POA: Insufficient documentation

## 2017-06-26 DIAGNOSIS — Z794 Long term (current) use of insulin: Secondary | ICD-10-CM | POA: Insufficient documentation

## 2017-06-26 DIAGNOSIS — K219 Gastro-esophageal reflux disease without esophagitis: Secondary | ICD-10-CM | POA: Diagnosis not present

## 2017-06-26 DIAGNOSIS — I10 Essential (primary) hypertension: Secondary | ICD-10-CM | POA: Diagnosis not present

## 2017-06-26 DIAGNOSIS — N9089 Other specified noninflammatory disorders of vulva and perineum: Secondary | ICD-10-CM

## 2017-06-26 HISTORY — PX: VULVECTOMY: SHX1086

## 2017-06-26 LAB — URINE DRUG SCREEN, QUALITATIVE (ARMC ONLY)
Amphetamines, Ur Screen: NOT DETECTED
BARBITURATES, UR SCREEN: NOT DETECTED
Benzodiazepine, Ur Scrn: NOT DETECTED
CANNABINOID 50 NG, UR ~~LOC~~: NOT DETECTED
COCAINE METABOLITE, UR ~~LOC~~: NOT DETECTED
MDMA (Ecstasy)Ur Screen: NOT DETECTED
Methadone Scn, Ur: NOT DETECTED
OPIATE, UR SCREEN: NOT DETECTED
PHENCYCLIDINE (PCP) UR S: NOT DETECTED
Tricyclic, Ur Screen: NOT DETECTED

## 2017-06-26 LAB — ABO/RH: ABO/RH(D): B POS

## 2017-06-26 LAB — GLUCOSE, CAPILLARY: GLUCOSE-CAPILLARY: 242 mg/dL — AB (ref 65–99)

## 2017-06-26 SURGERY — WIDE EXCISION VULVECTOMY
Anesthesia: General | Wound class: Clean Contaminated

## 2017-06-26 MED ORDER — PROPOFOL 10 MG/ML IV BOLUS
INTRAVENOUS | Status: AC
  Filled 2017-06-26: qty 20

## 2017-06-26 MED ORDER — FENTANYL CITRATE (PF) 100 MCG/2ML IJ SOLN
INTRAMUSCULAR | Status: DC | PRN
  Administered 2017-06-26: 50 ug via INTRAVENOUS

## 2017-06-26 MED ORDER — EPHEDRINE SULFATE 50 MG/ML IJ SOLN
INTRAMUSCULAR | Status: DC | PRN
  Administered 2017-06-26: 5 mg via INTRAVENOUS

## 2017-06-26 MED ORDER — ACETIC ACID 3 % SOLN
Status: AC
  Filled 2017-06-26: qty 500

## 2017-06-26 MED ORDER — ROCURONIUM BROMIDE 100 MG/10ML IV SOLN
INTRAVENOUS | Status: DC | PRN
  Administered 2017-06-26: 30 mg via INTRAVENOUS

## 2017-06-26 MED ORDER — PROMETHAZINE HCL 25 MG/ML IJ SOLN: 6.2500 mg | INTRAMUSCULAR | Status: DC | PRN

## 2017-06-26 MED ORDER — LIDOCAINE-EPINEPHRINE 1 %-1:100000 IJ SOLN
INTRAMUSCULAR | Status: DC | PRN
  Administered 2017-06-26: 4 mL

## 2017-06-26 MED ORDER — SUGAMMADEX SODIUM 200 MG/2ML IV SOLN
INTRAVENOUS | Status: AC
  Filled 2017-06-26: qty 2

## 2017-06-26 MED ORDER — PROPOFOL 10 MG/ML IV BOLUS
INTRAVENOUS | Status: DC | PRN
Start: 2017-06-26 — End: 2017-06-26
  Administered 2017-06-26: 50 mg via INTRAVENOUS
  Administered 2017-06-26: 150 mg via INTRAVENOUS

## 2017-06-26 MED ORDER — FENTANYL CITRATE (PF) 100 MCG/2ML IJ SOLN: 25.0000 ug | INTRAMUSCULAR | Status: DC | PRN

## 2017-06-26 MED ORDER — LACTATED RINGERS IV SOLN
INTRAVENOUS | Status: DC | PRN
  Administered 2017-06-26: 14:00:00 via INTRAVENOUS

## 2017-06-26 MED ORDER — ACETAMINOPHEN 500 MG PO TABS: 1000.0000 mg | ORAL_TABLET | Freq: Four times a day (QID) | ORAL | 2 refills | Status: AC | PRN

## 2017-06-26 MED ORDER — SODIUM CHLORIDE 0.9 % IV SOLN
INTRAVENOUS | Status: DC
  Administered 2017-06-26: 14:00:00 via INTRAVENOUS

## 2017-06-26 MED ORDER — ONDANSETRON HCL 4 MG/2ML IJ SOLN
INTRAMUSCULAR | Status: DC | PRN
  Administered 2017-06-26: 4 mg via INTRAVENOUS

## 2017-06-26 MED ORDER — SUCCINYLCHOLINE CHLORIDE 20 MG/ML IJ SOLN
INTRAMUSCULAR | Status: DC | PRN
  Administered 2017-06-26: 60 mg via INTRAVENOUS
  Administered 2017-06-26: 40 mg via INTRAVENOUS

## 2017-06-26 MED ORDER — LIDOCAINE-EPINEPHRINE 1 %-1:100000 IJ SOLN
INTRAMUSCULAR | Status: AC
  Filled 2017-06-26: qty 1

## 2017-06-26 MED ORDER — SUGAMMADEX SODIUM 200 MG/2ML IV SOLN
INTRAVENOUS | Status: DC | PRN
  Administered 2017-06-26: 181.4 mg via INTRAVENOUS

## 2017-06-26 MED ORDER — EPHEDRINE SULFATE 50 MG/ML IJ SOLN
INTRAMUSCULAR | Status: AC
  Filled 2017-06-26: qty 1

## 2017-06-26 MED ORDER — LACTATED RINGERS IV SOLN: INTRAVENOUS | Status: DC

## 2017-06-26 MED ORDER — LIDOCAINE HCL (CARDIAC) 20 MG/ML IV SOLN
INTRAVENOUS | Status: DC | PRN
  Administered 2017-06-26: 40 mg via INTRAVENOUS

## 2017-06-26 MED ORDER — MIDAZOLAM HCL 2 MG/2ML IJ SOLN
INTRAMUSCULAR | Status: DC | PRN
  Administered 2017-06-26: 2 mg via INTRAVENOUS

## 2017-06-26 MED ORDER — MIDAZOLAM HCL 2 MG/2ML IJ SOLN
INTRAMUSCULAR | Status: AC
  Filled 2017-06-26: qty 2

## 2017-06-26 MED ORDER — FENTANYL CITRATE (PF) 100 MCG/2ML IJ SOLN
INTRAMUSCULAR | Status: AC
  Filled 2017-06-26: qty 2

## 2017-06-26 SURGICAL SUPPLY — 27 items
BLADE SURG 15 STRL LF DISP TIS (BLADE) ×1 IMPLANT
BLADE SURG 15 STRL SS (BLADE) ×2
CANISTER SUCT 1200ML W/VALVE (MISCELLANEOUS) ×3 IMPLANT
CATH ROBINSON RED A/P 16FR (CATHETERS) ×3 IMPLANT
DRAPE PERI LITHO V/GYN (MISCELLANEOUS) ×3 IMPLANT
DRAPE UNDER BUTTOCK W/FLU (DRAPES) ×3 IMPLANT
DRSG TELFA 3X8 NADH (GAUZE/BANDAGES/DRESSINGS) IMPLANT
ELECT CAUTERY BLADE 6.4 (BLADE) ×3 IMPLANT
ELECT CAUTERY NEEDLE TIP 1.0 (MISCELLANEOUS) ×3
ELECT REM PT RETURN 9FT ADLT (ELECTROSURGICAL) ×3
ELECTRODE CAUTERY NEDL TIP 1.0 (MISCELLANEOUS) ×1 IMPLANT
ELECTRODE REM PT RTRN 9FT ADLT (ELECTROSURGICAL) ×1 IMPLANT
GLOVE BIO SURGEON STRL SZ8 (GLOVE) ×3 IMPLANT
GOWN STRL REUS W/ TWL LRG LVL3 (GOWN DISPOSABLE) ×1 IMPLANT
GOWN STRL REUS W/ TWL XL LVL3 (GOWN DISPOSABLE) ×1 IMPLANT
GOWN STRL REUS W/TWL LRG LVL3 (GOWN DISPOSABLE) ×2
GOWN STRL REUS W/TWL XL LVL3 (GOWN DISPOSABLE) ×2
KIT RM TURNOVER CYSTO AR (KITS) ×3 IMPLANT
NEEDLE HYPO 25X1 1.5 SAFETY (NEEDLE) ×3 IMPLANT
NS IRRIG 500ML POUR BTL (IV SOLUTION) ×3 IMPLANT
PACK BASIN MINOR ARMC (MISCELLANEOUS) ×3 IMPLANT
PAD OB MATERNITY 4.3X12.25 (PERSONAL CARE ITEMS) ×3 IMPLANT
PAD PREP 24X41 OB/GYN DISP (PERSONAL CARE ITEMS) ×3 IMPLANT
SPONGE XRAY 4X4 16PLY STRL (MISCELLANEOUS) ×3 IMPLANT
SUT CHROMIC 2 0 SH (SUTURE) ×3 IMPLANT
SUT VIC AB 0 CT2 27 (SUTURE) ×3 IMPLANT
SYR CONTROL 10ML (SYRINGE) ×3 IMPLANT

## 2017-06-26 NOTE — Transfer of Care (Signed)
Immediate Anesthesia Transfer of Care Note  Patient: Laura Camacho  Procedure(s) Performed: Procedure(s): WIDE EXCISION VULVECTOMY (N/A)  Patient Location: PACU  Anesthesia Type:General  Level of Consciousness: awake  Airway & Oxygen Therapy: Patient Spontanous Breathing and Patient connected to face mask oxygen  Post-op Assessment: Report given to RN and Post -op Vital signs reviewed and stable  Post vital signs: Reviewed and stable  Last Vitals:  Vitals:   06/26/17 1313 06/26/17 1523  BP: (!) 174/62 (!) 135/58  Pulse: (!) 50 (!) 56  Resp: 18 20  Temp: 36.7 C (!) 36.1 C  SpO2: 100% 99%    Last Pain:  Vitals:   06/26/17 1523  TempSrc:   PainSc: Asleep         Complications: No apparent anesthesia complications

## 2017-06-26 NOTE — Anesthesia Postprocedure Evaluation (Signed)
Anesthesia Post Note  Patient: Laura Camacho  Procedure(s) Performed: Procedure(s) (LRB): WIDE EXCISION VULVECTOMY (N/A)  Patient location during evaluation: PACU Anesthesia Type: General Level of consciousness: awake and alert Pain management: pain level controlled Vital Signs Assessment: post-procedure vital signs reviewed and stable Respiratory status: spontaneous breathing and respiratory function stable Cardiovascular status: stable Anesthetic complications: no     Last Vitals:  Vitals:   06/26/17 1538 06/26/17 1553  BP: (!) 141/69 (!) 161/71  Pulse: (!) 53 (!) 52  Resp: 18 13  Temp:    SpO2: 100% 93%    Last Pain:  Vitals:   06/26/17 1553  TempSrc:   PainSc: Asleep                 KEPHART,WILLIAM K

## 2017-06-26 NOTE — Anesthesia Post-op Follow-up Note (Signed)
Anesthesia QCDR form completed.        

## 2017-06-26 NOTE — Anesthesia Procedure Notes (Signed)
Procedure Name: LMA Insertion Date/Time: 06/26/2017 2:22 PM Performed by: Allean Found Pre-anesthesia Checklist: Patient identified, Emergency Drugs available, Suction available, Patient being monitored and Timeout performed Patient Re-evaluated:Patient Re-evaluated prior to induction Oxygen Delivery Method: Circle system utilized Preoxygenation: Pre-oxygenation with 100% oxygen Induction Type: IV induction Ventilation: Mask ventilation without difficulty LMA: LMA inserted LMA Size: 4.0 and 4.5 Number of attempts: 2 Placement Confirmation: positive ETCO2 and breath sounds checked- equal and bilateral Tube secured with: Tape Dental Injury: Teeth and Oropharynx as per pre-operative assessment  Comments: #4 LMA (green) placed with ease.  Zero ETCO2 noted.  Given additional Propofol.  4.5 Purple LMA inserted with ease and good ETCO2 waveform.  Pt moved toward surgeon. Zero Etco2. + laryngospasm.  Given succinylcholine and able to ventilate again.  Airway reactive, patient is a smoker.

## 2017-06-26 NOTE — Anesthesia Preprocedure Evaluation (Signed)
Anesthesia Evaluation  Patient identified by MRN, date of birth, ID band Patient awake    Reviewed: Allergy & Precautions, NPO status , Patient's Chart, lab work & pertinent test results, reviewed documented beta blocker date and time   History of Anesthesia Complications (+) PROLONGED EMERGENCE and history of anesthetic complications  Airway Mallampati: I  TM Distance: >3 FB Neck ROM: limited    Dental  (+) Chipped, Missing, Dental Advidsory Given   Pulmonary neg shortness of breath, neg sleep apnea, neg COPD, neg recent URI, Current Smoker,           Cardiovascular Exercise Tolerance: Good hypertension, (-) angina(-) CAD, (-) Past MI, (-) Cardiac Stents and (-) CABG (-) dysrhythmias (-) Valvular Problems/Murmurs     Neuro/Psych Seizures -,  fibromyalgia   GI/Hepatic Neg liver ROS, GERD  ,  Endo/Other  diabetes, Insulin Dependent  Renal/GU negative Renal ROS  negative genitourinary   Musculoskeletal   Abdominal   Peds  Hematology negative hematology ROS (+)   Anesthesia Other Findings Past Medical History: No date: Complication of anesthesia     Comment:  STATES WAS TOLD NOT TO TAKE UNKNOWN ANESTHESIA AFTER               NECKFUSION OR HYSTERECTOMY No date: Diabetes mellitus without complication (HCC) No date: Fibromyalgia No date: GERD (gastroesophageal reflux disease) No date: Glaucoma (increased eye pressure)     Comment:  History of Glaucoma but no drops No date: Headache No date: Hyperlipidemia No date: Hypertension No date: Neurofibromatosis (HCC)   Reproductive/Obstetrics negative OB ROS                             Anesthesia Physical  Anesthesia Plan  ASA: II  Anesthesia Plan: General   Post-op Pain Management:    Induction: Intravenous  PONV Risk Score and Plan: 2 and Ondansetron and Dexamethasone  Airway Management Planned: LMA  Additional Equipment:    Intra-op Plan:   Post-operative Plan: Extubation in OR  Informed Consent: I have reviewed the patients History and Physical, chart, labs and discussed the procedure including the risks, benefits and alternatives for the proposed anesthesia with the patient or authorized representative who has indicated his/her understanding and acceptance.     Plan Discussed with: CRNA  Anesthesia Plan Comments:         Anesthesia Quick Evaluation

## 2017-06-26 NOTE — Op Note (Signed)
OPERATIVE NOTE:  [place] PROCEDURE DATE: 06/26/2017   PREOPERATIVE DIAGNOSIS:  1. Perineal mass-4 cm 2. Neurofibromatosis  POSTOPERATIVE DIAGNOSIS:  1. Perineal mass-4 cm 2. Neurofibromatosis  PROCEDURE: Wide local excision of vulvar mass   SURGEON:  Brayton Mars, MD ASSISTANTS: None ANESTHESIA: General INDICATIONS: 56 y.o. [id number] with history of neurofibromatosis, presents for evaluation and management of a symptomatic perineal mass. Patient has a posterior fourchette lesion measuring 4 cm x 2 cm, mobile.  FINDINGS:  Posterior fourchette mass 2 x 4 cm, excised   I/O's: Total I/O In: -  Out: 100 [Urine:100] COUNTS:  YES SPECIMENS: Perineal mass ANTIBIOTIC PROPHYLAXIS:N/A COMPLICATIONS: None immediate  PROCEDURE IN DETAIL: Patient was brought to the operating room and placed in the supine position general anesthesia with the LMA technique is performed without difficulty. Patient was placed in the dorsal lithotomy position using candycane stirrups. A perineal prep and drape was performed in standard fashion with ChloraPrep. Timeout was completed. Red Robison catheter is used to drain 100 mL of urine from the bladder. The posterior fourchette mass is identified. Superior and inferior margins of mass are grasped with Allis clamps. The mass is excised using an elliptical excision technique. Scalpel and Bovie cautery are used to help facilitate the excision. Capillary bleeding is controlled with the Bovie cautery. Once the mass is excised, the posterior fourchette is reapproximated in standard fashion similar to an episiotomy repair. 2-0 Vicryl suture in a simple interrupted manner is used to reapproximate the deep tissues with 3 simple interrupted sutures. 2-0 chromic suture was then used in a simple running manner starting at the apex, closing the deeper dermis with a vertical mattress suture technique, followed by reapproximation of the epidermis with a  subcuticular running stitch. The knot is tied at the posterior fourchette as in an episiotomy repair. Good hemostasis was obtained. Blood loss is minimal. Procedure went without complication. Following the procedure the patient was awakened mobilized and taken to recovery room in satisfactory condition. All needles, instruments, and sponge counts were verified as correct. No complications.  [place] Dalporto A. Zipporah Plants, MD, ACOG [facility]

## 2017-06-26 NOTE — Discharge Instructions (Signed)

## 2017-06-27 ENCOUNTER — Encounter: Payer: Self-pay | Admitting: Obstetrics and Gynecology

## 2017-06-27 LAB — GLUCOSE, CAPILLARY: GLUCOSE-CAPILLARY: 281 mg/dL — AB (ref 65–99)

## 2017-06-29 LAB — SURGICAL PATHOLOGY

## 2017-07-05 ENCOUNTER — Ambulatory Visit (INDEPENDENT_AMBULATORY_CARE_PROVIDER_SITE_OTHER): Payer: Medicare HMO | Admitting: Obstetrics and Gynecology

## 2017-07-05 ENCOUNTER — Encounter: Payer: Self-pay | Admitting: Obstetrics and Gynecology

## 2017-07-05 VITALS — BP 156/76 | HR 54 | Ht 62.0 in | Wt 200.5 lb

## 2017-07-05 DIAGNOSIS — N9089 Other specified noninflammatory disorders of vulva and perineum: Secondary | ICD-10-CM

## 2017-07-05 DIAGNOSIS — Z09 Encounter for follow-up examination after completed treatment for conditions other than malignant neoplasm: Secondary | ICD-10-CM

## 2017-07-05 DIAGNOSIS — N909 Noninflammatory disorder of vulva and perineum, unspecified: Secondary | ICD-10-CM

## 2017-07-05 NOTE — Patient Instructions (Signed)
Return in 4 weeks for final postop check

## 2017-07-05 NOTE — Progress Notes (Signed)
Chief complaint: 1.  Two-week postop check. 2.  Excision of vulvar mass.  Patient presents for follow-up after surgical excision Of vulvar mass.  Pathology: DIAGNOSIS:  A. PERINEAL MASS; EXCISION:  - GRANULAR CELL TUMOR, EXCISED.   Patient reports normal bowel and bladder function.  She is having moderate discomfort at the excision s.  OBJECTIVE: BP (!) 156/76   Pulse (!) 54   Ht 5\' 2"  (1.575 m)   Wt 200 lb 8 oz (90.9 kg)   BMI 36.67 kg/m  Pleasant female in no acute distress. Abdomen: Soft, nontender Pelvic exam: External genitalia-vuvar excision site is well approximated with minimal separation of epithelium and minimal drainage. No palpable mass or fluctuance.  ASSESSMENT: 1.  2 weeks status post wide local excision of vulvar mass. 2.  Pathology-benign granular cell tumor.  PLAN: 1.  Continue with postoperative precautions. 2.  Return in 4 weeks for final postop check  Brayton Mars, MD

## 2017-08-01 ENCOUNTER — Encounter: Payer: Self-pay | Admitting: Obstetrics and Gynecology

## 2017-08-01 ENCOUNTER — Ambulatory Visit (INDEPENDENT_AMBULATORY_CARE_PROVIDER_SITE_OTHER): Payer: Medicare HMO | Admitting: Obstetrics and Gynecology

## 2017-08-01 VITALS — BP 198/72 | HR 54 | Ht 62.0 in | Wt 202.6 lb

## 2017-08-01 DIAGNOSIS — N909 Noninflammatory disorder of vulva and perineum, unspecified: Secondary | ICD-10-CM

## 2017-08-01 DIAGNOSIS — Z09 Encounter for follow-up examination after completed treatment for conditions other than malignant neoplasm: Secondary | ICD-10-CM

## 2017-08-01 DIAGNOSIS — Q8501 Neurofibromatosis, type 1: Secondary | ICD-10-CM

## 2017-08-01 DIAGNOSIS — N9089 Other specified noninflammatory disorders of vulva and perineum: Secondary | ICD-10-CM

## 2017-08-01 NOTE — Progress Notes (Signed)
Chief complaint: 1. Postop check 2. Status post vulvar mass excision to the 4 cm  Patient presents for postop check after surgery for removal of a 2 x 4 cm perineal mass.  Pathology: DIAGNOSIS:  A. PERINEAL MASS; EXCISION:  - GRANULAR CELL TUMOR, EXCISED.    Patient reports normal bowel and bladder function. She is not having any significant discomfort at the incision site.  OBJECTIVE: BP (!) 198/72   Pulse (!) 54   Ht 5\' 2"  (1.575 m)   Wt 202 lb 9.6 oz (91.9 kg)   BMI 37.06 kg/m   Well-appearing female in no acute distress. Alert and oriented. Pelvic exam: External genitalia-vulvar excision site at the posterior fourchette is well approximated and healing. Tworesidual sutures are noted at the site; palpation reveals no significant induration, mass, or tenderness.  ASSESSMENT: 1. Status post wide local excision of perineal mass 4 x 2 cm 2. 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 Avoid intercourse for one more week to maximize healing 2. Resume all other activities as tolerated without restriction 3. Return as needed for any gynecologic problems 4. Return to see Dr. Nicky Pugh for routine health maintenance.  Laura Camacho  Note: This dictation was prepared with Dragon dictation along with smaller Company secretary. Any transcriptional errors that result from this process are unintentional.

## 2017-08-01 NOTE — Patient Instructions (Signed)
1. Postop check was normal today 2. Avoid intercourse for one more week to allow complete healing 3. Return as needed for any gynecologic problems 4. Routine follow-up with Dr. Nicky Pugh for health maintenance.

## 2018-01-16 ENCOUNTER — Encounter: Payer: Self-pay | Admitting: Dietician

## 2018-01-16 ENCOUNTER — Encounter: Payer: Medicare HMO | Attending: Family Medicine | Admitting: Dietician

## 2018-01-16 VITALS — Ht 62.0 in | Wt 208.3 lb

## 2018-01-16 DIAGNOSIS — E119 Type 2 diabetes mellitus without complications: Secondary | ICD-10-CM

## 2018-01-16 DIAGNOSIS — Z713 Dietary counseling and surveillance: Secondary | ICD-10-CM | POA: Insufficient documentation

## 2018-01-16 DIAGNOSIS — Z794 Long term (current) use of insulin: Secondary | ICD-10-CM

## 2018-01-16 NOTE — Progress Notes (Signed)
Diabetes Self-Management Education  Visit Type: First/Initial  Appt. Start Time: 1115 Appt. End Time: 1245  01/16/2018  Ms. Laura Camacho, identified by name and date of birth, is a 57 y.o. female with a diagnosis of Diabetes: Type 2.   ASSESSMENT  Height 5\' 2"  (1.575 m), weight 208 lb 4.8 oz (94.5 kg). Body mass index is 38.1 kg/m.  Diabetes Self-Management Education - 01/16/18 1316      Visit Information   Visit Type  First/Initial      Initial Visit   Diabetes Type  Type 2      Health Coping   How would you rate your overall health?  Fair      Psychosocial Assessment   Patient Belief/Attitude about Diabetes  Motivated to manage diabetes    Self-care barriers  None    Self-management support  Doctor's office    Other persons present  Patient    Patient Concerns  Nutrition/Meal planning;Weight Control;Medication;Healthy Lifestyle;Glycemic Control prevent complications, become more fit    Special Needs  None    Preferred Learning Style  Hands on    Learning Readiness  Contemplating    What is the last grade level you completed in school?  12      Pre-Education Assessment   Patient understands the diabetes disease and treatment process.  Needs Review    Patient understands incorporating nutritional management into lifestyle.  Needs Review    Patient undertands incorporating physical activity into lifestyle.  Needs Review    Patient understands using medications safely.  Needs Review    Patient understands monitoring blood glucose, interpreting and using results  Needs Review    Patient understands prevention, detection, and treatment of acute complications.  Needs Review    Patient understands prevention, detection, and treatment of chronic complications.  Needs Review    Patient understands how to develop strategies to address psychosocial issues(has difficulty sleeping due to pain)  Needs Review    Patient understands how to develop strategies to promote  health/change behavior.  Needs Review      Complications   How often do you check your blood sugar?  1-2 times/day    Fasting Blood glucose range (mg/dL)  130-179;>200    Postprandial Blood glucose range (mg/dL)  70-129;130-179;>200;180-200    Have you had a dilated eye exam in the past 12 months?  Yes 08-2017    Have you had a dental exam in the past 12 months?  No 2 yr ago    Are you checking your feet?  No      Dietary Intake   Breakfast  eats breakfast at different times throughout day 9a-4p    Snack (morning)  none    Lunch  skips    Snack (afternoon)  none    Dinner  eats supper at [employer] (evening)  eats individual cup of applesauce at 9p-10p    Beverage(s)  drinks water 4-5x/day, regular sodas and sweet tea 4-5x/day      Exercise   Exercise Type  ADL's      Patient Education   Previous Diabetes Education  Yes (please comment) 18 years ago    Disease state   Explored patient's options for treatment of their diabetes;Definition of diabetes, type 1 and 2, and the diagnosis of diabetes    Nutrition management   Role of diet in the treatment of diabetes and the relationship between the three main macronutrients and blood glucose level;Carbohydrate counting;Food label reading, portion sizes  and measuring food.    Physical activity and exercise   Role of exercise on diabetes management, blood pressure control and cardiac health.    Medications  Taught/reviewed insulin injection, site rotation, insulin storage and needle disposal;Reviewed patients medication for diabetes, action, purpose, timing of dose and side effects  (pt takes Novolog 70/30 at 9a and 3p and takes Lantus at 9p daily)   Monitoring  Purpose and frequency of SMBG.;Taught/discussed recording of test results and interpretation of SMBG.;Identified appropriate SMBG and/or A1C goals.;Yearly dilated eye exam pt has accuchek guide meter    Acute complications  Taught treatment of hypoglycemia - the 15 rule.    Chronic  complications  Relationship between chronic complications and blood glucose control;Dental care;Retinopathy and reason for yearly dilated eye exams;Reviewed with patient heart disease, higher risk of, and prevention;Nephropathy, what it is, prevention of, the use of ACE, ARB's and early detection of through urine microalbumia.;Lipid levels, blood glucose control and heart disease    Personal strategies to promote health  Lifestyle issues that need to be addressed for better diabetes care;Helped patient develop diabetes management plan for (enter comment);Review risk of smoking and offered smoking cessation      Outcomes   Expected Outcomes  Demonstrated interest in learning. Expect positive outcomes       Individualized Plan for Diabetes Self-Management Training:   Learning Objective:  Patient will have a greater understanding of diabetes self-management. Patient education plan is to attend individual and/or group sessions per assessed needs and concerns.   Plan:   Patient Instructions  Check blood sugars 2 x day before breakfast and before supper and record  Bring blood sugar records to the next appointment/class  Exercise:  Start water exercises for  30-60   minutes   2  days a week  Eat 3 meals day and 1  snack a day at bedtime  Space meals 4-5 hours apart  Eat 2-3 carbohydrate servings/meal + protein  Eat 1 carbohydrate serving/snack + protein  Avoid sugar sweetened drinks (soda, tea, coffee, sports drinks, juices)  Limit intake of fried foods and sweets/desserts  Drink plenty of water  Quit  Smoking-consider free smoking cessation classes  Make a dentist appointment  Get a Sharps container  Carry fast acting glucose and a snack at all times  Take Novolog 70/30 insulin 25 units 15-20 min before breakfast and supper  Rotate injection sites  Go to MD office to have BP rechecked this week  Return for appointment/classes on:  02-01-18   Expected Outcomes:   Demonstrated interest in learning. Expect positive outcomes  Education material provided: General meal planning guidelines, Food Group handout, Low BG handout, Smoking cessation class handout  If problems or questions, patient to contact team via:  [phone]  Future DSME appointment:  02-01-18

## 2018-01-16 NOTE — Patient Instructions (Addendum)
Check blood sugars 2 x day before breakfast and before supper and record  Bring blood sugar records to the next appointment/class  Exercise:  Start water exercises for  30-60   minutes   2  days a week  Eat 3 meals day and 1  snack a day at bedtime  Space meals 4-5 hours apart  Eat 2-3 carbohydrate servings/meal + protein  Eat 1 carbohydrate serving/snack + protein  Avoid sugar sweetened drinks (soda, tea, coffee, sports drinks, juices)  Limit intake of fried foods and sweets/desserts  Drink plenty of water  Quit  Smoking-consider free smoking cessation classes  Make a dentist appointment  Get a Sharps container  Carry fast acting glucose and a snack at all times  Take Novolog 70/30 insulin 25 units 15-20 min before breakfast and supper  Rotate injection sites  Go to MD office to have BP rechecked this week  Return for appointment/classes on:  02-01-18

## 2018-02-01 ENCOUNTER — Ambulatory Visit: Payer: Medicare HMO

## 2018-02-02 ENCOUNTER — Telehealth: Payer: Self-pay | Admitting: Dietician

## 2018-02-02 NOTE — Telephone Encounter (Signed)
Called patient regarding classes scheduled to begin 02/01/18, which she cancelled. She states she is unable to reschedule at this time due to lack of transportation. Encouraged her to call back to reschedule if/when she is able to return.

## 2018-02-05 ENCOUNTER — Encounter: Payer: Self-pay | Admitting: Dietician

## 2018-02-08 ENCOUNTER — Ambulatory Visit: Payer: Medicare HMO

## 2018-02-15 ENCOUNTER — Ambulatory Visit: Payer: Medicare HMO

## 2018-03-19 ENCOUNTER — Other Ambulatory Visit: Payer: Self-pay | Admitting: Nurse Practitioner

## 2018-03-19 DIAGNOSIS — Z1231 Encounter for screening mammogram for malignant neoplasm of breast: Secondary | ICD-10-CM

## 2018-08-16 ENCOUNTER — Other Ambulatory Visit: Payer: Self-pay | Admitting: Nurse Practitioner

## 2018-08-16 DIAGNOSIS — Z1231 Encounter for screening mammogram for malignant neoplasm of breast: Secondary | ICD-10-CM

## 2018-08-31 ENCOUNTER — Ambulatory Visit
Admission: RE | Admit: 2018-08-31 | Discharge: 2018-08-31 | Disposition: A | Discharge status: Home / Self Care | Payer: Medicare HMO | Source: Ambulatory Visit | Attending: Nurse Practitioner | Admitting: Nurse Practitioner

## 2018-08-31 DIAGNOSIS — Z1231 Encounter for screening mammogram for malignant neoplasm of breast: Secondary | ICD-10-CM | POA: Insufficient documentation

## 2018-09-03 ENCOUNTER — Other Ambulatory Visit: Payer: Self-pay | Admitting: Nurse Practitioner

## 2018-09-03 DIAGNOSIS — N6320 Unspecified lump in the left breast, unspecified quadrant: Secondary | ICD-10-CM

## 2018-09-03 DIAGNOSIS — R928 Other abnormal and inconclusive findings on diagnostic imaging of breast: Secondary | ICD-10-CM

## 2018-10-23 ENCOUNTER — Ambulatory Visit
Admission: RE | Admit: 2018-10-23 | Discharge: 2018-10-23 | Disposition: A | Discharge status: Home / Self Care | Payer: Medicare HMO | Source: Ambulatory Visit | Attending: Nurse Practitioner | Admitting: Nurse Practitioner

## 2018-10-23 DIAGNOSIS — R928 Other abnormal and inconclusive findings on diagnostic imaging of breast: Secondary | ICD-10-CM

## 2018-10-23 DIAGNOSIS — N6320 Unspecified lump in the left breast, unspecified quadrant: Secondary | ICD-10-CM

## 2018-10-24 ENCOUNTER — Other Ambulatory Visit: Payer: Self-pay | Admitting: Nurse Practitioner

## 2018-10-24 DIAGNOSIS — N6320 Unspecified lump in the left breast, unspecified quadrant: Secondary | ICD-10-CM

## 2018-10-24 DIAGNOSIS — R928 Other abnormal and inconclusive findings on diagnostic imaging of breast: Secondary | ICD-10-CM

## 2018-11-05 ENCOUNTER — Ambulatory Visit
Admission: RE | Admit: 2018-11-05 | Discharge: 2018-11-05 | Disposition: A | Discharge status: Home / Self Care | Payer: Medicare HMO | Source: Ambulatory Visit | Attending: Nurse Practitioner | Admitting: Nurse Practitioner

## 2018-11-05 DIAGNOSIS — N6320 Unspecified lump in the left breast, unspecified quadrant: Secondary | ICD-10-CM

## 2018-11-05 DIAGNOSIS — R928 Other abnormal and inconclusive findings on diagnostic imaging of breast: Secondary | ICD-10-CM | POA: Insufficient documentation

## 2018-11-05 HISTORY — PX: BREAST BIOPSY: SHX20

## 2018-11-08 LAB — SURGICAL PATHOLOGY

## 2018-11-16 ENCOUNTER — Emergency Department: Payer: Medicare HMO

## 2018-11-16 ENCOUNTER — Inpatient Hospital Stay
Admission: EM | Admit: 2018-11-16 | Discharge: 2018-11-19 | DRG: 872 | Disposition: A | Discharge status: Home / Self Care | Payer: Medicare HMO | Attending: Specialist | Admitting: Specialist

## 2018-11-16 ENCOUNTER — Other Ambulatory Visit: Payer: Self-pay

## 2018-11-16 DIAGNOSIS — E119 Type 2 diabetes mellitus without complications: Secondary | ICD-10-CM | POA: Diagnosis present

## 2018-11-16 DIAGNOSIS — E876 Hypokalemia: Secondary | ICD-10-CM | POA: Diagnosis present

## 2018-11-16 DIAGNOSIS — I10 Essential (primary) hypertension: Secondary | ICD-10-CM | POA: Diagnosis present

## 2018-11-16 DIAGNOSIS — G894 Chronic pain syndrome: Secondary | ICD-10-CM | POA: Diagnosis present

## 2018-11-16 DIAGNOSIS — E785 Hyperlipidemia, unspecified: Secondary | ICD-10-CM | POA: Diagnosis present

## 2018-11-16 DIAGNOSIS — K219 Gastro-esophageal reflux disease without esophagitis: Secondary | ICD-10-CM | POA: Diagnosis present

## 2018-11-16 DIAGNOSIS — Z833 Family history of diabetes mellitus: Secondary | ICD-10-CM | POA: Diagnosis not present

## 2018-11-16 DIAGNOSIS — M797 Fibromyalgia: Secondary | ICD-10-CM | POA: Diagnosis present

## 2018-11-16 DIAGNOSIS — Z87440 Personal history of urinary (tract) infections: Secondary | ICD-10-CM | POA: Diagnosis not present

## 2018-11-16 DIAGNOSIS — Z79899 Other long term (current) drug therapy: Secondary | ICD-10-CM | POA: Diagnosis not present

## 2018-11-16 DIAGNOSIS — Z7952 Long term (current) use of systemic steroids: Secondary | ICD-10-CM

## 2018-11-16 DIAGNOSIS — Z7951 Long term (current) use of inhaled steroids: Secondary | ICD-10-CM | POA: Diagnosis not present

## 2018-11-16 DIAGNOSIS — Z794 Long term (current) use of insulin: Secondary | ICD-10-CM | POA: Diagnosis not present

## 2018-11-16 DIAGNOSIS — H409 Unspecified glaucoma: Secondary | ICD-10-CM | POA: Diagnosis present

## 2018-11-16 DIAGNOSIS — N1339 Other hydronephrosis: Secondary | ICD-10-CM | POA: Diagnosis not present

## 2018-11-16 DIAGNOSIS — F17210 Nicotine dependence, cigarettes, uncomplicated: Secondary | ICD-10-CM | POA: Diagnosis present

## 2018-11-16 DIAGNOSIS — B9620 Unspecified Escherichia coli [E. coli] as the cause of diseases classified elsewhere: Secondary | ICD-10-CM | POA: Diagnosis present

## 2018-11-16 DIAGNOSIS — N10 Acute tubulo-interstitial nephritis: Secondary | ICD-10-CM | POA: Diagnosis not present

## 2018-11-16 DIAGNOSIS — N3289 Other specified disorders of bladder: Secondary | ICD-10-CM | POA: Diagnosis present

## 2018-11-16 DIAGNOSIS — A419 Sepsis, unspecified organism: Principal | ICD-10-CM | POA: Diagnosis present

## 2018-11-16 DIAGNOSIS — N136 Pyonephrosis: Secondary | ICD-10-CM | POA: Diagnosis present

## 2018-11-16 DIAGNOSIS — Z90710 Acquired absence of both cervix and uterus: Secondary | ICD-10-CM | POA: Diagnosis not present

## 2018-11-16 DIAGNOSIS — R509 Fever, unspecified: Secondary | ICD-10-CM

## 2018-11-16 LAB — COMPREHENSIVE METABOLIC PANEL
ALT: 13 U/L (ref 0–44)
AST: 19 U/L (ref 15–41)
Albumin: 3.9 g/dL (ref 3.5–5.0)
Alkaline Phosphatase: 68 U/L (ref 38–126)
Anion gap: 11 (ref 5–15)
BUN: 12 mg/dL (ref 6–20)
CO2: 23 mmol/L (ref 22–32)
CREATININE: 1.31 mg/dL — AB (ref 0.44–1.00)
Calcium: 9.3 mg/dL (ref 8.9–10.3)
Chloride: 108 mmol/L (ref 98–111)
GFR calc non Af Amer: 45 mL/min — ABNORMAL LOW (ref >60)
GFR, EST AFRICAN AMERICAN: 52 mL/min — AB (ref >60)
Glucose, Bld: 194 mg/dL — ABNORMAL HIGH (ref 70–99)
Potassium: 2.9 mmol/L — ABNORMAL LOW (ref 3.5–5.1)
Sodium: 142 mmol/L (ref 135–145)
Total Bilirubin: 0.5 mg/dL (ref 0.3–1.2)
Total Protein: 7.5 g/dL (ref 6.5–8.1)

## 2018-11-16 LAB — CBC WITH DIFFERENTIAL/PLATELET
Abs Immature Granulocytes: 0.02 10*3/uL (ref 0.00–0.07)
Basophils Absolute: 0.1 10*3/uL (ref 0.0–0.1)
Basophils Relative: 1 %
Eosinophils Absolute: 0.1 10*3/uL (ref 0.0–0.5)
Eosinophils Relative: 1 %
HCT: 42.9 % (ref 36.0–46.0)
Hemoglobin: 14.2 g/dL (ref 12.0–15.0)
Immature Granulocytes: 0 %
Lymphocytes Relative: 25 %
Lymphs Abs: 2.9 10*3/uL (ref 0.7–4.0)
MCH: 26.9 pg (ref 26.0–34.0)
MCHC: 33.1 g/dL (ref 30.0–36.0)
MCV: 81.3 fL (ref 80.0–100.0)
Monocytes Absolute: 0.1 10*3/uL (ref 0.1–1.0)
Monocytes Relative: 1 %
NEUTROS PCT: 72 %
Neutro Abs: 8.6 10*3/uL — ABNORMAL HIGH (ref 1.7–7.7)
Platelets: 389 10*3/uL (ref 150–400)
RBC: 5.28 MIL/uL — ABNORMAL HIGH (ref 3.87–5.11)
RDW: 13.2 % (ref 11.5–15.5)
Smear Review: NORMAL
WBC: 11.7 10*3/uL — ABNORMAL HIGH (ref 4.0–10.5)
nRBC: 0 % (ref 0.0–0.2)

## 2018-11-16 LAB — URINALYSIS, COMPLETE (UACMP) WITH MICROSCOPIC
Bilirubin Urine: NEGATIVE
Glucose, UA: >=500 mg/dL — AB
Ketones, ur: NEGATIVE mg/dL
Nitrite: POSITIVE — AB
Protein, ur: 100 mg/dL — AB
RBC / HPF: >50 RBC/hpf — ABNORMAL HIGH (ref 0–5)
Specific Gravity, Urine: 1.012 (ref 1.005–1.030)
pH: 6 (ref 5.0–8.0)

## 2018-11-16 LAB — CREATININE, SERUM
Creatinine, Ser: 1.4 mg/dL — ABNORMAL HIGH (ref 0.44–1.00)
GFR calc Af Amer: 48 mL/min — ABNORMAL LOW (ref >60)
GFR calc non Af Amer: 41 mL/min — ABNORMAL LOW (ref >60)

## 2018-11-16 LAB — GLUCOSE, CAPILLARY
Glucose-Capillary: 183 mg/dL — ABNORMAL HIGH (ref 70–99)
Glucose-Capillary: 164 mg/dL — ABNORMAL HIGH (ref 70–99)
Glucose-Capillary: 178 mg/dL — ABNORMAL HIGH (ref 70–99)

## 2018-11-16 LAB — HEMOGLOBIN A1C
Hgb A1c MFr Bld: 8 % — ABNORMAL HIGH (ref 4.8–5.6)
MEAN PLASMA GLUCOSE: 182.9 mg/dL

## 2018-11-16 LAB — MAGNESIUM: Magnesium: 1.8 mg/dL (ref 1.7–2.4)

## 2018-11-16 LAB — CBC
HCT: 38.8 % (ref 36.0–46.0)
Hemoglobin: 12.6 g/dL (ref 12.0–15.0)
MCH: 26.5 pg (ref 26.0–34.0)
MCHC: 32.5 g/dL (ref 30.0–36.0)
MCV: 81.7 fL (ref 80.0–100.0)
NRBC: 0 % (ref 0.0–0.2)
Platelets: 333 10*3/uL (ref 150–400)
RBC: 4.75 MIL/uL (ref 3.87–5.11)
RDW: 13.5 % (ref 11.5–15.5)
WBC: 32.3 10*3/uL — AB (ref 4.0–10.5)

## 2018-11-16 LAB — LIPASE, BLOOD: Lipase: 45 U/L (ref 11–51)

## 2018-11-16 LAB — POTASSIUM: Potassium: 3.4 mmol/L — ABNORMAL LOW (ref 3.5–5.1)

## 2018-11-16 MED ORDER — DILTIAZEM HCL ER COATED BEADS 180 MG PO CP24
360.0000 mg | ORAL_CAPSULE | Freq: Every day | ORAL | Status: DC
  Administered 2018-11-18: 360 mg via ORAL
  Filled 2018-11-16 (×5): qty 2

## 2018-11-16 MED ORDER — FLUTICASONE PROPIONATE 50 MCG/ACT NA SUSP
1.0000 | Freq: Every day | NASAL | Status: DC | PRN
  Filled 2018-11-16: qty 16

## 2018-11-16 MED ORDER — ACETAMINOPHEN 650 MG RE SUPP: 650.0000 mg | Freq: Four times a day (QID) | RECTAL | Status: DC | PRN

## 2018-11-16 MED ORDER — SODIUM CHLORIDE 0.9 % IV BOLUS
1000.0000 mL | Freq: Once | INTRAVENOUS | Status: AC
  Administered 2018-11-16: 1000 mL via INTRAVENOUS

## 2018-11-16 MED ORDER — SODIUM CHLORIDE 0.9 % IV SOLN
2.0000 g | INTRAVENOUS | Status: DC
  Administered 2018-11-16 – 2018-11-18 (×3): 2 g via INTRAVENOUS
  Filled 2018-11-16 (×2): qty 20
  Filled 2018-11-16 (×2): qty 2

## 2018-11-16 MED ORDER — ATORVASTATIN CALCIUM 20 MG PO TABS
20.0000 mg | ORAL_TABLET | Freq: Every day | ORAL | Status: DC
  Administered 2018-11-18 – 2018-11-19 (×2): 20 mg via ORAL
  Filled 2018-11-16 (×3): qty 1

## 2018-11-16 MED ORDER — ENOXAPARIN SODIUM 40 MG/0.4ML ~~LOC~~ SOLN
40.0000 mg | SUBCUTANEOUS | Status: DC
  Administered 2018-11-17 – 2018-11-18 (×2): 40 mg via SUBCUTANEOUS
  Filled 2018-11-16 (×3): qty 0.4

## 2018-11-16 MED ORDER — POTASSIUM CHLORIDE CRYS ER 20 MEQ PO TBCR
20.0000 meq | EXTENDED_RELEASE_TABLET | Freq: Once | ORAL | Status: AC
  Administered 2018-11-16: 20 meq via ORAL
  Filled 2018-11-16: qty 1

## 2018-11-16 MED ORDER — ONDANSETRON HCL 4 MG/2ML IJ SOLN
4.0000 mg | Freq: Four times a day (QID) | INTRAMUSCULAR | Status: DC | PRN
  Administered 2018-11-18 – 2018-11-19 (×3): 4 mg via INTRAVENOUS
  Filled 2018-11-16 (×3): qty 2

## 2018-11-16 MED ORDER — PANTOPRAZOLE SODIUM 40 MG PO TBEC: 40.0000 mg | DELAYED_RELEASE_TABLET | Freq: Every day | ORAL | Status: DC

## 2018-11-16 MED ORDER — MORPHINE SULFATE (PF) 4 MG/ML IV SOLN
4.0000 mg | Freq: Once | INTRAVENOUS | Status: AC
  Administered 2018-11-16: 4 mg via INTRAVENOUS

## 2018-11-16 MED ORDER — NEBIVOLOL HCL 10 MG PO TABS
20.0000 mg | ORAL_TABLET | Freq: Every day | ORAL | Status: DC
  Administered 2018-11-18 – 2018-11-19 (×2): 20 mg via ORAL
  Filled 2018-11-16 (×4): qty 2

## 2018-11-16 MED ORDER — POTASSIUM CHLORIDE IN NACL 20-0.9 MEQ/L-% IV SOLN
INTRAVENOUS | Status: DC
  Administered 2018-11-16 – 2018-11-19 (×4): via INTRAVENOUS
  Filled 2018-11-16 (×7): qty 1000

## 2018-11-16 MED ORDER — INSULIN ASPART 100 UNIT/ML ~~LOC~~ SOLN: 0.0000 [IU] | Freq: Three times a day (TID) | SUBCUTANEOUS | Status: DC

## 2018-11-16 MED ORDER — INSULIN GLARGINE 100 UNIT/ML ~~LOC~~ SOLN
30.0000 [IU] | Freq: Every day | SUBCUTANEOUS | Status: DC
  Administered 2018-11-16 – 2018-11-18 (×2): 30 [IU] via SUBCUTANEOUS
  Filled 2018-11-16 (×4): qty 0.3

## 2018-11-16 MED ORDER — GABAPENTIN 300 MG PO CAPS: 300.0000 mg | ORAL_CAPSULE | Freq: Every evening | ORAL | Status: DC | PRN

## 2018-11-16 MED ORDER — HYDROMORPHONE HCL 1 MG/ML IJ SOLN
1.0000 mg | Freq: Once | INTRAMUSCULAR | Status: AC
  Administered 2018-11-16: 1 mg via INTRAVENOUS
  Filled 2018-11-16: qty 1

## 2018-11-16 MED ORDER — MORPHINE SULFATE (PF) 4 MG/ML IV SOLN
INTRAVENOUS | Status: AC
  Administered 2018-11-16: 4 mg via INTRAVENOUS
  Filled 2018-11-16: qty 1

## 2018-11-16 MED ORDER — ACETAMINOPHEN 325 MG PO TABS
650.0000 mg | ORAL_TABLET | Freq: Four times a day (QID) | ORAL | Status: DC | PRN
  Administered 2018-11-17 (×2): 650 mg via ORAL
  Filled 2018-11-16 (×2): qty 2

## 2018-11-16 MED ORDER — POLYETHYLENE GLYCOL 3350 17 G PO PACK: 17.0000 g | PACK | Freq: Every day | ORAL | Status: DC | PRN

## 2018-11-16 MED ORDER — SODIUM CHLORIDE 0.9 % IV SOLN
100.0000 mg | Freq: Once | INTRAVENOUS | Status: AC
  Administered 2018-11-16: 100 mg via INTRAVENOUS
  Filled 2018-11-16: qty 100

## 2018-11-16 MED ORDER — INSULIN ASPART 100 UNIT/ML ~~LOC~~ SOLN
0.0000 [IU] | Freq: Three times a day (TID) | SUBCUTANEOUS | Status: DC
  Administered 2018-11-16 (×2): 2 [IU] via SUBCUTANEOUS
  Administered 2018-11-17: 1 [IU] via SUBCUTANEOUS
  Administered 2018-11-18: 2 [IU] via SUBCUTANEOUS
  Filled 2018-11-16 (×6): qty 1

## 2018-11-16 MED ORDER — ONDANSETRON HCL 4 MG/2ML IJ SOLN
4.0000 mg | Freq: Once | INTRAMUSCULAR | Status: AC
  Administered 2018-11-16: 4 mg via INTRAVENOUS

## 2018-11-16 MED ORDER — POTASSIUM CHLORIDE 10 MEQ/100ML IV SOLN
10.0000 meq | INTRAVENOUS | Status: AC
  Administered 2018-11-16 (×4): 10 meq via INTRAVENOUS
  Filled 2018-11-16 (×4): qty 100

## 2018-11-16 MED ORDER — ONDANSETRON HCL 4 MG PO TABS
4.0000 mg | ORAL_TABLET | Freq: Four times a day (QID) | ORAL | Status: DC | PRN
  Filled 2018-11-16: qty 1

## 2018-11-16 MED ORDER — PANTOPRAZOLE SODIUM 40 MG PO TBEC
40.0000 mg | DELAYED_RELEASE_TABLET | Freq: Every day | ORAL | Status: DC
  Administered 2018-11-17 – 2018-11-19 (×3): 40 mg via ORAL
  Filled 2018-11-16 (×4): qty 1

## 2018-11-16 MED ORDER — ONDANSETRON HCL 4 MG/2ML IJ SOLN
INTRAMUSCULAR | Status: AC
  Administered 2018-11-16: 4 mg via INTRAVENOUS
  Filled 2018-11-16: qty 2

## 2018-11-16 NOTE — ED Notes (Signed)
Called pharmacy about IV doxycycline. Stated that it would be sent shortly

## 2018-11-16 NOTE — ED Notes (Signed)
Pt is given a cup of iced water. Ok'd by Dr. Owens Shark.

## 2018-11-16 NOTE — ED Triage Notes (Signed)
Pt stated that she woke up with back pain and abd pain that started tonight. Pt stated that she has noticed a strong smell to her urine as well.

## 2018-11-16 NOTE — ED Notes (Signed)
Pt is back from medical imaging. 

## 2018-11-16 NOTE — ED Notes (Signed)
RN on 1A to call me back to take report

## 2018-11-16 NOTE — Progress Notes (Signed)
Pharmacy Electrolyte Monitoring Consult:  Pharmacy consulted to assist in monitoring and replacing electrolytes in this 58 y.o. female admitted on 11/16/2018 with Pain; Back Pain; and Urinary Tract Infection   Labs:  Sodium (mmol/L)  Date Value  11/16/2018 142   Potassium (mmol/L)  Date Value  11/16/2018 3.4 (L)   Magnesium (mg/dL)  Date Value  11/16/2018 1.8   Calcium (mg/dL)  Date Value  11/16/2018 9.3   Albumin (g/dL)  Date Value  11/16/2018 3.9    Pt has received Potassium chloride 10 meq IV x 4 (total of 40 meq).    Assessment/Plan: K 3.4 Scr 1.40  Crcl 44 ml/min Will order KDur 20 mEq PO x1 Will f/u labs in am   Rocky Morel 11/16/2018 7:57 PM

## 2018-11-16 NOTE — Progress Notes (Signed)
Inpatient Diabetes Program Recommendations  AACE/ADA: New Consensus Statement on Inpatient Glycemic Control  Target Ranges:  Prepandial:   less than 140 mg/dL      Peak postprandial:   less than 180 mg/dL (1-2 hours)      Critically ill patients:  140 - 180 mg/dL   Results for RENA, HUNKE (MRN [id number]) as of 11/16/2018 09:41  Ref. Range 11/16/2018 02:52  Glucose Latest Ref Range: 70 - 99 mg/dL 194 (H)   Review of Glycemic Control  Diabetes history: DM2 Outpatient Diabetes medications: Lantus 30 units QHS Current orders for Inpatient glycemic control: None; in Emergency Room  Inpatient Diabetes Program Recommendations:  Correction (SSI): Please consider ordering CBGs with Novolog 0-15 units Q4H. HbgA1C: Please consider ordering an A1C to evaluate glycemic control over the past 2-3 months.  NOTE: Noted consult for Diabetes Coordinator. Patient is currently in Emergency Room. Chart reviewed. Recommend ordering CBGs with Novolog 0-15 units Q4H and an A1C. If glucose is consistently greater than 180 mg/dl on 11/17/18, may want to consider adding low dose basal insulin. Will continue to follow.  Thanks, Barnie Alderman, RN, MSN, CDE Diabetes Coordinator Inpatient Diabetes Program [phone] (Team Pager from 8am to 5pm)

## 2018-11-16 NOTE — ED Notes (Signed)
Attempted to put pt on bedpan. Pt continues to sleep. Woke pt up multiple times to attempt to get a UA pt still continues to sleep. Will notify MD.

## 2018-11-16 NOTE — ED Provider Notes (Signed)
[facility] Emergency Department Provider Note    First MD Initiated Contact with Patient 11/16/18 [id number]     (approximate)  I have reviewed the triage vital signs and the nursing notes.   HISTORY  Chief Complaint Pain; Back Pain; and Urinary Tract Infection    HPI Laura Camacho is a 58 y.o. female with below list of chronic medical conditions presents to the emergency department via EMS with complaint of 10 out of 10 left back/flank pain which started tonight.  Patient states that she has had dysuria & malodorous urine for "a while".  Patient also admits to subjective fevers and chills.  Patient admits to acute onset of nausea and vomiting as well.  Patient states current pain score is 10 out of 10.   Past Medical History:  Diagnosis Date  . Complication of anesthesia    STATES WAS TOLD NOT TO TAKE UNKNOWN ANESTHESIA AFTER NECKFUSION OR HYSTERECTOMY  . Diabetes mellitus without complication ([place])   . Fibromyalgia   . GERD (gastroesophageal reflux disease)   . Glaucoma (increased eye pressure)    History of Glaucoma but no drops  . Headache   . Hyperlipidemia   . Hypertension   . Neurofibromatosis [facility])     Patient Active Problem List   Diagnosis Date Noted  . Vulvar mass 06/26/2017  . Monilial vulvovaginitis 05/23/2017  . Hypertension 05/05/2017  . Hyperlipidemia 05/05/2017  . GERD (gastroesophageal reflux disease) 05/05/2017  . Dyslipidemia 05/05/2017  . Chronic pain syndrome 05/05/2017  . Obesity 05/05/2017  . Status post hysterectomy 05/05/2017  . Menopause 05/05/2017  . Increased BMI 05/05/2017    Past Surgical History:  Procedure Laterality Date  . ABDOMINAL HYSTERECTOMY     has both ovaries- [place]  . Bilateral Arm Surgery Bilateral    'tumors' removed  . BREAST BIOPSY Left 11/05/2018   [place] core path pend venus clip  . BREAST BIOPSY Left 11/05/2018   [place] core path pend axilla hydro marker  . CERVICAL FUSION    .  COLON SURGERY    . COLONOSCOPY WITH PROPOFOL N/A 02/12/2016   Procedure: COLONOSCOPY WITH PROPOFOL;  Surgeon: Josefine Class, MD;  Location: [facility] ENDOSCOPY;  Service: Endoscopy;  Laterality: N/A;  . ESOPHAGOGASTRODUODENOSCOPY (EGD) WITH PROPOFOL N/A 02/12/2016   Procedure: ESOPHAGOGASTRODUODENOSCOPY (EGD) WITH PROPOFOL;  Surgeon: Josefine Class, MD;  Location: [facility] ENDOSCOPY;  Service: Endoscopy;  Laterality: N/A;  . VULVECTOMY N/A 06/26/2017   Procedure: WIDE EXCISION VULVECTOMY;  Surgeon: Brayton Mars, MD;  Location: [facility] ORS;  Service: Gynecology;  Laterality: N/A;    Prior to Admission medications   Medication Sig Start Date End Date Taking? Authorizing Provider  LANTUS SOLOSTAR 100 UNIT/ML Solostar Pen Inject 30 Units into the skin at bedtime.  12/25/17  Yes [provider]  atorvastatin (LIPITOR) 20 MG tablet Take 20 mg by mouth daily.     [provider]  cloNIDine (CATAPRES) 0.1 MG tablet Take 1 tablet by mouth daily. At bedtime 01/09/18   [provider]  dexlansoprazole (DEXILANT) 60 MG capsule Take 60 mg by mouth daily.    [provider]  diltiazem (DILACOR XR) 180 MG 24 hr capsule Take 360 mg by mouth at bedtime.    [provider]  Docusate Calcium (STOOL SOFTENER PO) Take 1 capsule by mouth daily.    [provider]  fluticasone (FLONASE) 50 MCG/ACT nasal spray Place 1 spray into both nostrils daily as needed for allergies or rhinitis.  [provider]  gabapentin (NEURONTIN) 300 MG capsule Take 300 mg by mouth at bedtime as needed (pain-pt takes 1-2 capsules at bedtime PRN).     [provider]  Nebivolol HCl (BYSTOLIC) 20 MG TABS Take 20 mg by mouth daily.     [provider]  nitrofurantoin (MACRODANTIN) 100 MG capsule Take 100 mg by mouth daily. 01/09/18   [provider]  nystatin-triamcinolone (MYCOLOG II) cream Apply 1 application topically 2 (two) times daily as needed.     [provider]  omeprazole (PRILOSEC) 40 MG capsule Take 40 mg by mouth at bedtime.     [provider]    Allergies Codeine; Bupropion; Fluconazole; Ibuprofen; Iodine; Ivp dye [iodinated diagnostic agents]; Metformin and related; Nizoral [ketoconazole]; Sulfa antibiotics; Voltaren [diclofenac sodium]; Cefuroxime; and Ciprofloxacin  Family History  Problem Relation Age of Onset  . Diabetes Mother   . Diabetes Father   . Diabetes Maternal Aunt   . Diabetes Maternal Uncle   . Breast cancer Neg Hx   . Ovarian cancer Neg Hx   . Colon cancer Neg Hx     Social History Social History   Tobacco Use  . Smoking status: Current Every Day Smoker    Packs/day: 0.50    Types: Cigarettes  . Smokeless tobacco: Never Used  Substance Use Topics  . Alcohol use: Yes    Alcohol/week: 3.0 - 4.0 standard drinks    Types: 3 - 4 Shots of liquor per week    Comment: once a week  . Drug use: Yes    Types: Marijuana    Comment: monthly    Review of Systems Constitutional: No fever/chills Eyes: No visual changes. ENT: No sore throat. Cardiovascular: Denies chest pain. Respiratory: Denies shortness of breath. Gastrointestinal: Positive for left flank pain nausea and vomiting Genitourinary: Positive for dysuria and malodorous urine Musculoskeletal: Negative for neck pain.  Negative for back pain. Integumentary: Negative for rash. Neurological: Negative for headaches, focal weakness or numbness.   ____________________________________________   PHYSICAL EXAM:  VITAL SIGNS: ED Triage Vitals  Enc Vitals Group     BP 11/16/18 0256 (!) 142/56     Pulse Rate 11/16/18 0300 (!) 59     Resp 11/16/18 0256 18     Temp 11/16/18 0256 98.1 F (36.7 C)     Temp Source 11/16/18 0256 Oral     SpO2 11/16/18 0256 96 %     Weight 11/16/18 0247 90.7 kg (200 lb)     Height 11/16/18 0247 1.524 m (5')     Head Circumference --      Peak Flow --      Pain Score 11/16/18 0247 10      Pain Loc --      Pain Edu? --      Excl. in [place]? --     Constitutional: Alert and oriented.  Apparent discomfort  eyes: Conjunctivae are normal.  Mouth/Throat: Mucous membranes are moist.  Oropharynx non-erythematous. Neck: No stridor.   Cardiovascular: Normal rate, regular rhythm. Good peripheral circulation. Grossly normal heart sounds. Respiratory: Normal respiratory effort.  No retractions. Lungs CTAB. Gastrointestinal: Soft and nontender. No distention.  Positive for left CVA tenderness Musculoskeletal: No lower extremity tenderness nor edema. No gross deformities of extremities. Neurologic:  Normal speech and language. No gross focal neurologic deficits are appreciated.  Skin:  Skin is warm, dry and intact. No rash noted.   ____________________________________________   LABS (all labs ordered are listed, but only abnormal  results are displayed)  Labs Reviewed  CBC WITH DIFFERENTIAL/PLATELET - Abnormal; Notable for the following components:      Result Value   WBC 11.7 (*)    RBC 5.28 (*)    Neutro Abs 8.6 (*)    All other components within normal limits  COMPREHENSIVE METABOLIC PANEL - Abnormal; Notable for the following components:   Potassium 2.9 (*)    Glucose, Bld 194 (*)    Creatinine, Ser 1.31 (*)    GFR calc non Af Amer 45 (*)    GFR calc Af Amer 52 (*)    All other components within normal limits  URINALYSIS, COMPLETE (UACMP) WITH MICROSCOPIC - Abnormal; Notable for the following components:   Color, Urine YELLOW (*)    APPearance HAZY (*)    Glucose, UA >=500 (*)    Hgb urine dipstick MODERATE (*)    Protein, ur 100 (*)    Nitrite POSITIVE (*)    Leukocytes, UA SMALL (*)    RBC / HPF >50 (*)    Bacteria, UA RARE (*)    All other components within normal limits  URINE CULTURE  LIPASE, BLOOD   _______________________________________  RADIOLOGY I, [place] Berenis Corter, personally viewed and evaluated these images (plain radiographs) as part of my medical  decision making, as well as reviewing the written report by the radiologist.  ED MD interpretation: CT renal revealed left hydronephrosis and hydroureter with stranding around the left kidney and ureter per radiologist  Official radiology report(s): Ct Renal Stone Study  Result Date: 11/16/2018 CLINICAL DATA:  Back pain and abdominal pain starting tonight. Strong smell to the urine. Left flank pain. EXAM: CT ABDOMEN AND PELVIS WITHOUT CONTRAST TECHNIQUE: Multidetector CT imaging of the abdomen and pelvis was performed following the standard protocol without IV contrast. COMPARISON:  10/08/2004 FINDINGS: Lower chest: Mild dependent changes in the lung bases. Small esophageal hiatal hernia. Hepatobiliary: Heterogeneous diffuse fatty infiltration of the liver. No focal lesions identified. Layering increased density in the gallbladder could represent milk of calcium or tiny stones. No gallbladder wall thickening or edema. No bile duct dilatation. Pancreas: Unremarkable. No pancreatic ductal dilatation or surrounding inflammatory changes. Spleen: Normal in size without focal abnormality. Adrenals/Urinary Tract: No adrenal gland nodules. The left kidney is enlarged with stranding around the left kidney and ureter. Moderate left hydronephrosis and hydroureter. No stone is definitely identified but the appearance is consistent with ureteral obstruction. Differential diagnosis could include an occult stone, sequela of a recently passed stone, stricture or inflammatory lesion, or reflux with pyelonephritis. Right kidney and ureter are unremarkable. Bladder wall is not thickened. No bladder stones. Stomach/Bowel: Stomach is within normal limits. Appendix appears normal. No evidence of bowel wall thickening, distention, or inflammatory changes. Vascular/Lymphatic: Aortic atherosclerosis. No enlarged abdominal or pelvic lymph nodes. Reproductive: Status post hysterectomy. No adnexal masses. Other: No abdominal wall hernia  or abnormality. No abdominopelvic ascites. Musculoskeletal: No acute or significant osseous findings. IMPRESSION: 1. Left hydronephrosis and hydroureter with stranding around the left kidney and ureter. No stone is definitely identified but the appearance is consistent with ureteral obstruction. Differential diagnosis could include an occult stone, stricture, inflammatory lesion, or reflux with pyelonephritis. 2. Heterogeneous diffuse fatty infiltration of the liver. 3. Small esophageal hiatal hernia. Aortic Atherosclerosis (ICD10-I70.0). Electronically Signed   By: Lucienne Capers M.D.   On: 11/16/2018 03:49     Procedures   ____________________________________________   INITIAL IMPRESSION / ASSESSMENT AND PLAN / ED COURSE  As part of  my medical decision making, I reviewed the following data within the electronic MEDICAL RECORD NUMBER  58 year old female presenting with above-stated history and physical exam secondary to left flank pain dysuria and malodorous urine.  Concern for possible pyelonephritis, kidney stone.  Patient's urinalysis consistent with a UTI.  No stone noted on the patient's CT scan.  Patient was given IV morphine but states that her pain did not improve at all".  As such patient was given 1 mg of IV Dilaudid.  Secondary to patient's multiple antibiotic allergies patient was given IV doxycycline for her UTI.  Urine culture pending.  Patient discussed with Dr. Marcille Blanco for hospital admission for further evaluation and management. ____________________________________________  FINAL CLINICAL IMPRESSION(S) / ED DIAGNOSES  Final diagnoses:  Acute pyelonephritis     MEDICATIONS GIVEN DURING THIS VISIT:  Medications  doxycycline (VIBRAMYCIN) 100 mg in sodium chloride 0.9 % 250 mL IVPB (has no administration in time range)  sodium chloride 0.9 % bolus 1,000 mL (0 mLs Intravenous Stopped 11/16/18 0417)  morphine 4 MG/ML injection 4 mg (4 mg Intravenous Given 11/16/18 0255)    ondansetron (ZOFRAN) injection 4 mg (4 mg Intravenous Given 11/16/18 0255)  HYDROmorphone (DILAUDID) injection 1 mg (1 mg Intravenous Given 11/16/18 0315)     ED Discharge Orders    None       Note:  This document was prepared using Dragon voice recognition software and may include unintentional dictation errors.    Gregor Hams, MD 11/16/18 [phone]

## 2018-11-16 NOTE — ED Notes (Signed)
Patient denies pain and is resting comfortably.  

## 2018-11-16 NOTE — ED Notes (Signed)
Call to 1A regarding report

## 2018-11-16 NOTE — Progress Notes (Addendum)
Pharmacy Electrolyte Monitoring Consult:  Pharmacy consulted to assist in monitoring and replacing electrolytes in this 58 y.o. female admitted on 11/16/2018 with Pain; Back Pain; and Urinary Tract Infection   Labs:  Sodium (mmol/L)  Date Value  11/16/2018 142   Potassium (mmol/L)  Date Value  11/16/2018 2.9 (L)   Calcium (mg/dL)  Date Value  11/16/2018 9.3   Albumin (g/dL)  Date Value  11/16/2018 3.9    Assessment/Plan: K 2.9  Mag 1.8 Scr 1.31  Crcl 47 ml/min  Will order Potassium chloride 10 meq IV x 4 (total of 40 meq). Will recheck K at 1800. Will f/u labs in am   Darshawn Boateng A 11/16/2018 9:35 AM

## 2018-11-16 NOTE — H&P (Addendum)
[facility] NAME: Laura Camacho    MR#:  [id number]  DATE OF BIRTH:  1961/05/17  DATE OF ADMISSION:  11/16/2018  PRIMARY CARE PHYSICIAN: [place]   REQUESTING/REFERRING PHYSICIAN: dr brown  CHIEF COMPLAINT:    left sided flank pain HISTORY OF PRESENT ILLNESS:  Laura Camacho  is a 58 y.o. female with a known history of diabetes who presents emergency room due to left-sided flank pain and malodorous urine.  Patient also endorses dysuria, frequency and urgency.   Patient has been started on doxycycline for acute pyelonephritis.  Patient also reports fever and chills.  PAST MEDICAL HISTORY:   Past Medical History:  Diagnosis Date  . Complication of anesthesia    STATES WAS TOLD NOT TO TAKE UNKNOWN ANESTHESIA AFTER NECKFUSION OR HYSTERECTOMY  . Diabetes mellitus without complication ([place])   . Fibromyalgia   . GERD (gastroesophageal reflux disease)   . Glaucoma (increased eye pressure)    History of Glaucoma but no drops  . Headache   . Hyperlipidemia   . Hypertension   . Neurofibromatosis ([place])     PAST SURGICAL HISTORY:   Past Surgical History:  Procedure Laterality Date  . ABDOMINAL HYSTERECTOMY     has both ovaries- [place]  . Bilateral Arm Surgery Bilateral    'tumors' removed  . BREAST BIOPSY Left 11/05/2018   [place] core path pend venus clip  . BREAST BIOPSY Left 11/05/2018   [place] core path pend axilla hydro marker  . CERVICAL FUSION    . COLON SURGERY    . COLONOSCOPY WITH PROPOFOL N/A 02/12/2016   Procedure: COLONOSCOPY WITH PROPOFOL;  Surgeon: Josefine Class, MD;  Location: [facility] ENDOSCOPY;  Service: Endoscopy;  Laterality: N/A;  . ESOPHAGOGASTRODUODENOSCOPY (EGD) WITH PROPOFOL N/A 02/12/2016   Procedure: ESOPHAGOGASTRODUODENOSCOPY (EGD) WITH PROPOFOL;  Surgeon: Josefine Class, MD;  Location: [facility] ENDOSCOPY;  Service: Endoscopy;  Laterality: N/A;  . VULVECTOMY N/A 06/26/2017   Procedure: WIDE EXCISION VULVECTOMY;  Surgeon: Brayton Mars, MD;  Location: [facility] ORS;  Service: Gynecology;  Laterality: N/A;    SOCIAL HISTORY:   Social History   Tobacco Use  . Smoking status: Current Every Day Smoker    Packs/day: 0.50    Types: Cigarettes  . Smokeless tobacco: Never Used  Substance Use Topics  . Alcohol use: Yes    Alcohol/week: 3.0 - 4.0 standard drinks    Types: 3 - 4 Shots of liquor per week    Comment: once a week    FAMILY HISTORY:   Family History  Problem Relation Age of Onset  . Diabetes Mother   . Diabetes Father   . Diabetes Maternal Aunt   . Diabetes Maternal Uncle   . Breast cancer Neg Hx   . Ovarian cancer Neg Hx   . Colon cancer Neg Hx     DRUG ALLERGIES:   Allergies  Allergen Reactions  . Codeine Anaphylaxis    "Throat swells up"  . Bupropion     Unknown  . Fluconazole Itching  . Ibuprofen Itching  . Iodine Hives  . Ivp Dye [Iodinated Diagnostic Agents] Hives  . Metformin And Related Itching  . Nizoral [Ketoconazole] Other (See Comments)    Dizzy/ Fainting  . Sulfa Antibiotics Hives  . Voltaren [Diclofenac Sodium] Diarrhea and Nausea And Vomiting  . Cefuroxime Rash  . Ciprofloxacin Rash    REVIEW OF SYSTEMS:   Review of Systems  Constitutional: Positive for chills  and fever. Negative for malaise/fatigue.  HENT: Negative.  Negative for ear discharge, ear pain, hearing loss, nosebleeds and sore throat.   Eyes: Negative.  Negative for blurred vision and pain.  Respiratory: Negative.  Negative for cough, hemoptysis, shortness of breath and wheezing.   Cardiovascular: Negative.  Negative for chest pain, palpitations and leg swelling.  Gastrointestinal: Negative.  Negative for abdominal pain, blood in stool, diarrhea, nausea and vomiting.  Genitourinary: Positive for dysuria, flank pain, frequency and urgency.  Musculoskeletal: Negative for back pain.  Skin: Negative.   Neurological: Negative for dizziness,  tremors, speech change, focal weakness, seizures and headaches.  Endo/Heme/Allergies: Negative.  Does not bruise/bleed easily.  Psychiatric/Behavioral: Negative.  Negative for depression, hallucinations and suicidal ideas.    MEDICATIONS AT HOME:   Prior to Admission medications   Medication Sig Start Date End Date Taking? Authorizing Provider  atorvastatin (LIPITOR) 20 MG tablet Take 20 mg by mouth daily.    Yes [provider]  dexlansoprazole (DEXILANT) 60 MG capsule Take 60 mg by mouth daily.   Yes [provider]  diltiazem (DILACOR XR) 180 MG 24 hr capsule Take 360 mg by mouth at bedtime.   Yes [provider]  Docusate Calcium (STOOL SOFTENER PO) Take 1 capsule by mouth daily.   Yes [provider]  fluticasone (FLONASE) 50 MCG/ACT nasal spray Place 1 spray into both nostrils daily as needed for allergies or rhinitis.   Yes [provider]  gabapentin (NEURONTIN) 300 MG capsule Take 300-600 mg by mouth at bedtime as needed (pain-pt takes 1-2 capsules at bedtime PRN).    Yes [provider]  LANTUS SOLOSTAR 100 UNIT/ML Solostar Pen Inject 30 Units into the skin at bedtime.  12/25/17  Yes [provider]  Nebivolol HCl (BYSTOLIC) 20 MG TABS Take 20 mg by mouth daily.    Yes [provider]  omeprazole (PRILOSEC) 40 MG capsule Take 40 mg by mouth at bedtime.    Yes [provider]      VITAL SIGNS:  Blood pressure (!) 145/60, pulse 61, temperature 98.1 F (36.7 C), temperature source Oral, resp. rate 16, height 5' (1.524 m), weight 90.7 kg, SpO2 96 %.  PHYSICAL EXAMINATION:   Physical Exam Constitutional:      General: She is not in acute distress. HENT:     Head: Normocephalic.  Eyes:     General: No scleral icterus. Neck:     Musculoskeletal: Normal range of motion and neck supple.     Vascular: No JVD.     Trachea: No tracheal deviation.  Cardiovascular:     Rate and Rhythm: Normal rate and  regular rhythm.     Heart sounds: Normal heart sounds. No murmur. No friction rub. No gallop.   Pulmonary:     Effort: Pulmonary effort is normal. No respiratory distress.     Breath sounds: Normal breath sounds. No wheezing or rales.  Chest:     Chest wall: No tenderness.  Abdominal:     General: Bowel sounds are normal. There is no distension.     Palpations: Abdomen is soft. There is no mass.     Tenderness: There is no abdominal tenderness. There is no guarding or rebound.  Musculoskeletal: Normal range of motion.     Comments: Left-sided flank pain  Skin:    General: Skin is warm.     Findings: No erythema or rash.  Neurological:     Mental Status: She is alert and oriented  to person, place, and time.  Psychiatric:        Judgment: Judgment normal.       LABORATORY PANEL:   CBC Recent Labs  Lab 11/16/18 0252  WBC 11.7*  HGB 14.2  HCT 42.9  PLT 389   ------------------------------------------------------------------------------------------------------------------  Chemistries  Recent Labs  Lab 11/16/18 0252  NA 142  K 2.9*  CL 108  CO2 23  GLUCOSE 194*  BUN 12  CREATININE 1.31*  CALCIUM 9.3  AST 19  ALT 13  ALKPHOS 68  BILITOT 0.5   ------------------------------------------------------------------------------------------------------------------  Cardiac Enzymes No results for input(s): TROPONINI in the last 168 hours. ------------------------------------------------------------------------------------------------------------------  RADIOLOGY:    EKG:   Orders placed or performed during the hospital encounter of 06/26/17  . EKG    IMPRESSION AND PLAN:   58 year old female with history of diabetes who presents with left flank pain and found to have acute pyelonephritis.  1.  Acute pyelonephritis: Patient will be started on ciprofloxacin.  She is allergic to Rocephin.  Follow-up on final urine culture. IV fluids to be started as well as PRN  pain medications F/u CT  2.  Hypokalemia: This will be treated and rechecked in a.m.  3.  Diabetes: Patient will be on ADA diet, sliding scale A1c will be checked as per advised by diabetes nurse and further recommendations as per diabetes nurse coordinator.  4.  Hypertension: Continue diltiazem  5.  Hyperlipidemia: Continue statin  6.  GERD: Continue PPI 7. Tobacco dependence: Patient is encouraged to quit smoking. Counseling was provided for 4 minutes.  All the records are reviewed and case discussed with ED provider. Management plans discussed with the patient and she is in agreement  CODE STATUS: full  TOTAL TIME TAKING CARE OF THIS PATIENT: 45 minutes.    Careen Mauch M.D on 11/16/2018 at 10:00 AM  Between 7am to 6pm - Pager - [phone]  After 6pm go to [email or web address] - password [email or web address] [facility]  Office  [phone]  CC: Primary care physician; [place]

## 2018-11-17 ENCOUNTER — Inpatient Hospital Stay: Payer: Medicare HMO

## 2018-11-17 DIAGNOSIS — N10 Acute tubulo-interstitial nephritis: Secondary | ICD-10-CM

## 2018-11-17 DIAGNOSIS — N1339 Other hydronephrosis: Secondary | ICD-10-CM

## 2018-11-17 LAB — CBC
HCT: 35.7 % — ABNORMAL LOW (ref 36.0–46.0)
Hemoglobin: 11.9 g/dL — ABNORMAL LOW (ref 12.0–15.0)
MCH: 26.8 pg (ref 26.0–34.0)
MCHC: 33.3 g/dL (ref 30.0–36.0)
MCV: 80.4 fL (ref 80.0–100.0)
Platelets: 289 10*3/uL (ref 150–400)
RBC: 4.44 MIL/uL (ref 3.87–5.11)
RDW: 13.5 % (ref 11.5–15.5)
WBC: 28 10*3/uL — ABNORMAL HIGH (ref 4.0–10.5)
nRBC: 0 % (ref 0.0–0.2)

## 2018-11-17 LAB — GLUCOSE, CAPILLARY
Glucose-Capillary: 125 mg/dL — ABNORMAL HIGH (ref 70–99)
Glucose-Capillary: 109 mg/dL — ABNORMAL HIGH (ref 70–99)
Glucose-Capillary: 143 mg/dL — ABNORMAL HIGH (ref 70–99)
Glucose-Capillary: 93 mg/dL (ref 70–99)

## 2018-11-17 LAB — HIV ANTIBODY (ROUTINE TESTING W REFLEX): HIV Screen 4th Generation wRfx: NONREACTIVE

## 2018-11-17 LAB — BASIC METABOLIC PANEL
Anion gap: 7 (ref 5–15)
BUN: 13 mg/dL (ref 6–20)
CALCIUM: 7.8 mg/dL — AB (ref 8.9–10.3)
CO2: 20 mmol/L — ABNORMAL LOW (ref 22–32)
Chloride: 112 mmol/L — ABNORMAL HIGH (ref 98–111)
Creatinine, Ser: 1.29 mg/dL — ABNORMAL HIGH (ref 0.44–1.00)
GFR calc Af Amer: 53 mL/min — ABNORMAL LOW (ref >60)
GFR calc non Af Amer: 46 mL/min — ABNORMAL LOW (ref >60)
Glucose, Bld: 171 mg/dL — ABNORMAL HIGH (ref 70–99)
Potassium: 3.3 mmol/L — ABNORMAL LOW (ref 3.5–5.1)
Sodium: 139 mmol/L (ref 135–145)

## 2018-11-17 LAB — MAGNESIUM: Magnesium: 1.4 mg/dL — ABNORMAL LOW (ref 1.7–2.4)

## 2018-11-17 MED ORDER — SODIUM CHLORIDE 0.9 % IV SOLN
INTRAVENOUS | Status: DC | PRN
  Administered 2018-11-17: 500 mL via INTRAVENOUS

## 2018-11-17 MED ORDER — POTASSIUM CHLORIDE CRYS ER 20 MEQ PO TBCR
40.0000 meq | EXTENDED_RELEASE_TABLET | Freq: Once | ORAL | Status: AC
  Administered 2018-11-17: 40 meq via ORAL
  Filled 2018-11-17: qty 2

## 2018-11-17 MED ORDER — MAGNESIUM SULFATE 4 GM/100ML IV SOLN
4.0000 g | Freq: Once | INTRAVENOUS | Status: AC
  Administered 2018-11-17: 4 g via INTRAVENOUS
  Filled 2018-11-17: qty 100

## 2018-11-17 NOTE — Consult Note (Signed)
Urology Consult  I have been asked to see the patient by Dr. Benjie Karvonen, for evaluation and management of left hydronephrosis/pyelonephritis.  Chief Complaint: Left flank pain  History of Present Illness: Laura Camacho is a 58 y.o. year old female who presented to the emergency room yesterday with acute onset left flank pain which woke her up from her sleep.  She notes that several days prior to this, she was feeling cold and chilled without any other significant urinary symptoms.  No malodorous urine.  She denies that she was having any dysuria, urgency or frequency at the time of admission but this was documented as part of her symptoms on her admission H&P.  In the emergency room, her UA was grossly positive for infection (currently growing E. Coli), marked leukocytosis to 32.3, mildly elevated creatinine to 1.4 from her baseline of around 1.2.  She underwent noncontrast CT scan which showed left perinephric edema with some mild hydronephrosis without a clear obstructing stone.   Urology was called to consult on the patient this morning.  The patient had remained afebrile until this morning when she spiked a fever of 102.9.  She was otherwise hemodynamically stable.  Follow-up renal ultrasound was ordered by me which shows resolution of her hydronephrosis, fairly unremarkable renal ultrasound without any clear abscess or significant findings.  She does note that her flank pain is persistent this morning but improving.  She overall feels well and is asking to resume her diet.  Personal history of UTIs, approximately 1 infection per year.  She denies a history of kidney stones or pyelonephritis.   Past Medical History:  Diagnosis Date  . Complication of anesthesia    STATES WAS TOLD NOT TO TAKE UNKNOWN ANESTHESIA AFTER NECKFUSION OR HYSTERECTOMY  . Diabetes mellitus without complication ([place])   . Fibromyalgia   . GERD (gastroesophageal reflux disease)   . Glaucoma (increased eye  pressure)    History of Glaucoma but no drops  . Headache   . Hyperlipidemia   . Hypertension   . Neurofibromatosis [facility])     Past Surgical History:  Procedure Laterality Date  . ABDOMINAL HYSTERECTOMY     has both ovaries- [place]  . Bilateral Arm Surgery Bilateral    'tumors' removed  . BREAST BIOPSY Left 11/05/2018   [place] core path pend venus clip  . BREAST BIOPSY Left 11/05/2018   [place] core path pend axilla hydro marker  . CERVICAL FUSION    . COLON SURGERY    . COLONOSCOPY WITH PROPOFOL N/A 02/12/2016   Procedure: COLONOSCOPY WITH PROPOFOL;  Surgeon: Josefine Class, MD;  Location: [facility] ENDOSCOPY;  Service: Endoscopy;  Laterality: N/A;  . ESOPHAGOGASTRODUODENOSCOPY (EGD) WITH PROPOFOL N/A 02/12/2016   Procedure: ESOPHAGOGASTRODUODENOSCOPY (EGD) WITH PROPOFOL;  Surgeon: Josefine Class, MD;  Location: [facility] ENDOSCOPY;  Service: Endoscopy;  Laterality: N/A;  . VULVECTOMY N/A 06/26/2017   Procedure: WIDE EXCISION VULVECTOMY;  Surgeon: Brayton Mars, MD;  Location: [facility] ORS;  Service: Gynecology;  Laterality: N/A;    Home Medications:  Current Meds  Medication Sig  . atorvastatin (LIPITOR) 20 MG tablet Take 20 mg by mouth daily.   Marland Kitchen dexlansoprazole (DEXILANT) 60 MG capsule Take 60 mg by mouth daily.  Marland Kitchen diltiazem (DILACOR XR) 180 MG 24 hr capsule Take 360 mg by mouth at bedtime.  Mariane Baumgarten Calcium (STOOL SOFTENER PO) Take 1 capsule by mouth daily.  . fluticasone (FLONASE) 50 MCG/ACT nasal spray Place 1 spray into both nostrils  daily as needed for allergies or rhinitis.  Marland Kitchen gabapentin (NEURONTIN) 300 MG capsule Take 300-600 mg by mouth at bedtime as needed (pain-pt takes 1-2 capsules at bedtime PRN).   Marland Kitchen LANTUS SOLOSTAR 100 UNIT/ML Solostar Pen Inject 30 Units into the skin at bedtime.   . Nebivolol HCl (BYSTOLIC) 20 MG TABS Take 20 mg by mouth daily.   Marland Kitchen omeprazole (PRILOSEC) 40 MG capsule Take 40 mg by mouth at bedtime.     Allergies:  Allergies  Allergen  Reactions  . Codeine Anaphylaxis    "Throat swells up"  . Bupropion     Unknown  . Fluconazole Itching  . Ibuprofen Itching  . Iodine Hives  . Ivp Dye [Iodinated Diagnostic Agents] Hives  . Metformin And Related Itching  . Nizoral [Ketoconazole] Other (See Comments)    Dizzy/ Fainting  . Sulfa Antibiotics Hives  . Voltaren [Diclofenac Sodium] Diarrhea and Nausea And Vomiting  . Cefuroxime Rash  . Ciprofloxacin Rash    Family History  Problem Relation Age of Onset  . Diabetes Mother   . Diabetes Father   . Diabetes Maternal Aunt   . Diabetes Maternal Uncle   . Breast cancer Neg Hx   . Ovarian cancer Neg Hx   . Colon cancer Neg Hx     Social History:  reports that she has been smoking cigarettes. She has been smoking about 0.50 packs per day. She has never used smokeless tobacco. She reports current alcohol use of about 3.0 - 4.0 standard drinks of alcohol per week. She reports current drug use. Drug: Marijuana.  ROS: A complete review of systems was performed.  All systems are negative except for pertinent findings as noted.  Physical Exam:  Vital signs in last 24 hours: Temp:  [98.2 F (36.8 C)-102.9 F (39.4 C)] 102.9 F (39.4 C) (01/11 0801) Pulse Rate:  [56-71] 71 (01/11 0801) Resp:  [18-22] 18 (01/11 0801) BP: (109-148)/(43-64) 124/44 (01/11 0801) SpO2:  [98 %-100 %] 99 % (01/11 0801) Constitutional:  Alert and oriented, No acute distress HEENT: [place] AT, moist mucus membranes.  Trachea midline, no masses Cardiovascular: No clubbing, cyanosis, or edema. Respiratory: Normal respiratory effort, no increased WOB GI: Abdomen is soft, nondistended, no abdominal masses, mild tenderness in LUQ GU: + L CVA tenderness Skin: No rashes, bruises or suspicious lesions Neurologic: Grossly intact, no focal deficits, moving all 4 extremities Psychiatric: Normal mood and affect   Laboratory Data:  Recent Labs    11/16/18 0252 11/16/18 1637 11/17/18 0521  WBC 11.7* 32.3*  28.0*  HGB 14.2 12.6 11.9*  HCT 42.9 38.8 35.7*   Recent Labs    11/16/18 0252 11/16/18 1637 11/16/18 1648 11/17/18 0521  NA 142  --   --  139  K 2.9*  --  3.4* 3.3*  CL 108  --   --  112*  CO2 23  --   --  20*  GLUCOSE 194*  --   --  171*  BUN 12  --   --  13  CREATININE 1.31* 1.40*  --  1.29*  CALCIUM 9.3  --   --  7.8*   No results for input(s): LABPT, INR in the last 72 hours. No results for input(s): LABURIN in the last 72 hours. Results for orders placed or performed during the hospital encounter of 11/16/18  Urine culture     Status: Abnormal (Preliminary result)   Collection Time: 11/16/18  4:29 AM  Result Value Ref Range Status  Specimen Description   Final    URINE, CATHETERIZED Performed at [facility], [address] [place], [place] [address]    Special Requests   Final    NONE Performed at [facility], [address] [place], [place] [address]    Culture >=100,000 COLONIES/mL ESCHERICHIA COLI (A)  Final   Report Status PENDING  Incomplete     Radiologic Imaging: US Renal  Result Date: 11/17/2018 CLINICAL DATA:  Patient with fever. EXAM: RENAL / URINARY TRACT ULTRASOUND COMPLETE COMPARISON:  CT abdomen pelvis 11/16/2018 FINDINGS: Right Kidney: Renal measurements: 11.9 x 5.1 x 5.6 cm = volume: 182 mL . Echogenicity within normal limits. No mass or hydronephrosis visualized. Left Kidney: Renal measurements: 12.0 x 7.4 x 6.1 cm = volume: 288 mL. Echogenicity within normal limits. No mass or hydronephrosis visualized. Bladder: Appears normal for degree of bladder distention. IMPRESSION: No hydronephrosis identified on current exam. Electronically Signed   By: Lovey Newcomer M.D.   On: 11/17/2018 10:46   Ct Renal Stone Study  Result Date: 11/16/2018 CLINICAL DATA:  Back pain and abdominal pain starting tonight. Strong smell to the urine. Left flank pain. EXAM: CT ABDOMEN AND PELVIS WITHOUT CONTRAST TECHNIQUE: Multidetector CT imaging of the  abdomen and pelvis was performed following the standard protocol without IV contrast. COMPARISON:  10/08/2004 FINDINGS: Lower chest: Mild dependent changes in the lung bases. Small esophageal hiatal hernia. Hepatobiliary: Heterogeneous diffuse fatty infiltration of the liver. No focal lesions identified. Layering increased density in the gallbladder could represent milk of calcium or tiny stones. No gallbladder wall thickening or edema. No bile duct dilatation. Pancreas: Unremarkable. No pancreatic ductal dilatation or surrounding inflammatory changes. Spleen: Normal in size without focal abnormality. Adrenals/Urinary Tract: No adrenal gland nodules. The left kidney is enlarged with stranding around the left kidney and ureter. Moderate left hydronephrosis and hydroureter. No stone is definitely identified but the appearance is consistent with ureteral obstruction. Differential diagnosis could include an occult stone, sequela of a recently passed stone, stricture or inflammatory lesion, or reflux with pyelonephritis. Right kidney and ureter are unremarkable. Bladder wall is not thickened. No bladder stones. Stomach/Bowel: Stomach is within normal limits. Appendix appears normal. No evidence of bowel wall thickening, distention, or inflammatory changes. Vascular/Lymphatic: Aortic atherosclerosis. No enlarged abdominal or pelvic lymph nodes. Reproductive: Status post hysterectomy. No adnexal masses. Other: No abdominal wall hernia or abnormality. No abdominopelvic ascites. Musculoskeletal: No acute or significant osseous findings. IMPRESSION: 1. Left hydronephrosis and hydroureter with stranding around the left kidney and ureter. No stone is definitely identified but the appearance is consistent with ureteral obstruction. Differential diagnosis could include an occult stone, stricture, inflammatory lesion, or reflux with pyelonephritis. 2. Heterogeneous diffuse fatty infiltration of the liver. 3. Small esophageal  hiatal hernia. Aortic Atherosclerosis (ICD10-I70.0). Electronically Signed   By: Lucienne Capers M.D.   On: 11/16/2018 03:49   CT scan was personally reviewed by me.  It does appear that she has left nephromegaly with surrounding inflammation and edema within the kidney.  There is some fullness of the left collecting system on CT scan.  Follow-up renal ultrasound was also personally reviewed which shows resolution of this hydronephrosis.  Impression/Assessment:  58 year old female with acute left pyelonephritis initially noted to have some evidence of obstruction on CT scan which is likely secondary to inflammatory changes which have resolved on follow-up renal ultrasound today.  Although she continues to spike significant fevers, her WBC is trending dow, Cr improving and she overall feels  clinically improved today which is reassuring.  No evidence of abscess on ultrasound.  Plan:  -No plans for surgical intervention given resolution of hydronephrosis., diet advanced -Suspect hydronephrosis was functional related to perinephric and periureteral edema which is improving -No evidence of renal abscess -We discussed natural history of pyelonephritis, not unusual to have high-grade fevers for up to 72 hours after being on the appropriate antibiotics  -Urology will sign off, please page if patient does not continue to improve clinically or with any questions or concerns  11/17/2018, 11:48 AM  Hollice Espy,  MD

## 2018-11-17 NOTE — Progress Notes (Signed)
Patient's evening CBG 93. Patient with current orders to receive 30U Lantus, as per home medication regimen. MD notified of patient's CBG and orders. Verbal order obtained to hold tonight's dose of Lantus. Snack administered to patient. Will reassess CBG and continue to monitor.

## 2018-11-17 NOTE — Progress Notes (Signed)
Laura Camacho at [place] NAME: Laura Camacho    MR#:  [id number]  DATE OF BIRTH:  [birth year]  SUBJECTIVE:   Still with flank pain fever this am  REVIEW OF SYSTEMS:    Review of Systems  Constitutional: ++for fever, no chills weight loss HENT: Negative for ear pain, nosebleeds, congestion, facial swelling, rhinorrhea, neck pain, neck stiffness and ear discharge.   Respiratory: Negative for cough, shortness of breath, wheezing  Cardiovascular: Negative for chest pain, palpitations and leg swelling.  Gastrointestinal: Negative for heartburn, abdominal pain, vomiting, diarrhea or consitpation Genitourinary: ++ for dysuria, urgency, frequency,no hematuria Musculoskeletal: Negative for back pain or joint pain Neurological: Negative for dizziness, seizures, syncope, focal weakness,  numbness and headaches.  Hematological: Does not bruise/bleed easily.  Psychiatric/Behavioral: Negative for hallucinations, confusion, dysphoric mood    Tolerating Diet: yes      DRUG ALLERGIES:   Allergies  Allergen Reactions  . Codeine Anaphylaxis    "Throat swells up"  . Bupropion     Unknown  . Fluconazole Itching  . Ibuprofen Itching  . Iodine Hives  . Ivp Dye [Iodinated Diagnostic Agents] Hives  . Metformin And Related Itching  . Nizoral [Ketoconazole] Other (See Comments)    Dizzy/ Fainting  . Sulfa Antibiotics Hives  . Voltaren [Diclofenac Sodium] Diarrhea and Nausea And Vomiting  . Cefuroxime Rash  . Ciprofloxacin Rash    VITALS:  Blood pressure (!) 124/44, pulse 71, temperature (!) 102.9 F (39.4 C), resp. rate 18, height 5' (1.524 m), weight 90.7 kg, SpO2 99 %.  PHYSICAL EXAMINATION:  Constitutional: Appears well-developed and well-nourished. No distress. HENT: Normocephalic. Marland Kitchen Oropharynx is clear and moist.  Eyes: Conjunctivae and EOM are normal. PERRLA, no scleral icterus.  Neck: Normal ROM. Neck supple. No JVD. No tracheal  deviation. CVS: RRR, S1/S2 +, no murmurs, no gallops, no carotid bruit.  Pulmonary: Effort and breath sounds normal, no stridor, rhonchi, wheezes, rales.  Abdominal: Soft. BS +,  no distension, tenderness, rebound or guarding.  Musculoskeletal: Normal range of motion. No edema and no tenderness.  ++FLANK PAIN Neuro: Alert. CN 2-12 grossly intact. No focal deficits. Skin: Skin is warm and dry. No rash noted. Psychiatric: Normal mood and affect.      LABORATORY PANEL:   CBC Recent Labs  Lab 11/17/18 0521  WBC 28.0*  HGB 11.9*  HCT 35.7*  PLT 289   ------------------------------------------------------------------------------------------------------------------  Chemistries  Recent Labs  Lab 11/16/18 0252  11/17/18 0521  NA 142  --  139  K 2.9*   < > 3.3*  CL 108  --  112*  CO2 23  --  20*  GLUCOSE 194*  --  171*  BUN 12  --  13  CREATININE 1.31*   < > 1.29*  CALCIUM 9.3  --  7.8*  MG 1.8  --  1.4*  AST 19  --   --   ALT 13  --   --   ALKPHOS 68  --   --   BILITOT 0.5  --   --    < > = values in this interval not displayed.   ------------------------------------------------------------------------------------------------------------------  Cardiac Enzymes No results for input(s): TROPONINI in the last 168 hours. ------------------------------------------------------------------------------------------------------------------  RADIOLOGY:  Ct Renal Stone Study  Result Date: 11/16/2018 CLINICAL DATA:  Back pain and abdominal pain starting tonight. Strong smell to the urine. Left flank pain. EXAM: CT ABDOMEN AND PELVIS WITHOUT CONTRAST TECHNIQUE: Multidetector CT imaging of the  abdomen and pelvis was performed following the standard protocol without IV contrast. COMPARISON:  10/08/2004 FINDINGS: Lower chest: Mild dependent changes in the lung bases. Small esophageal hiatal hernia. Hepatobiliary: Heterogeneous diffuse fatty infiltration of the liver. No focal lesions  identified. Layering increased density in the gallbladder could represent milk of calcium or tiny stones. No gallbladder wall thickening or edema. No bile duct dilatation. Pancreas: Unremarkable. No pancreatic ductal dilatation or surrounding inflammatory changes. Spleen: Normal in size without focal abnormality. Adrenals/Urinary Tract: No adrenal gland nodules. The left kidney is enlarged with stranding around the left kidney and ureter. Moderate left hydronephrosis and hydroureter. No stone is definitely identified but the appearance is consistent with ureteral obstruction. Differential diagnosis could include an occult stone, sequela of a recently passed stone, stricture or inflammatory lesion, or reflux with pyelonephritis. Right kidney and ureter are unremarkable. Bladder wall is not thickened. No bladder stones. Stomach/Bowel: Stomach is within normal limits. Appendix appears normal. No evidence of bowel wall thickening, distention, or inflammatory changes. Vascular/Lymphatic: Aortic atherosclerosis. No enlarged abdominal or pelvic lymph nodes. Reproductive: Status post hysterectomy. No adnexal masses. Other: No abdominal wall hernia or abnormality. No abdominopelvic ascites. Musculoskeletal: No acute or significant osseous findings. IMPRESSION: 1. Left hydronephrosis and hydroureter with stranding around the left kidney and ureter. No stone is definitely identified but the appearance is consistent with ureteral obstruction. Differential diagnosis could include an occult stone, stricture, inflammatory lesion, or reflux with pyelonephritis. 2. Heterogeneous diffuse fatty infiltration of the liver. 3. Small esophageal hiatal hernia. Aortic Atherosclerosis (ICD10-I70.0). Electronically Signed   By: Lucienne Capers M.D.   On: 11/16/2018 03:49     ASSESSMENT AND PLAN:   58 year old female with history of diabetes who presents with left flank pain and found to have acute pyelonephritis.  1.  Acute  pyelonephritis with Left hydronephrosis and hydroureter with stranding around the left kidney and ureter/left urethral stone possibly:  Continue Rocephin (seems tolerate this despite allergy listed as rash) If she continues to spike may change to meropenem Case discussed with urology this morning Renal ultrasound ordered to evaluate hydronephrosis/hydroureter Continue IV fluids Follow-up on final cultures  2.  Hypokalemia: Replace PRN  3.  Diabetes: Continue ADA diet with sliding scale 4.  Hypertension: Continue diltiazem Continue Lantus and recommendations as per diabetes nurse   5.  Hyperlipidemia: Continue statin  6.  GERD: Continue PPI 7. Tobacco dependence: Patient is encouraged to quit smoking  8.  Hypertension: Continue Bystolic and diltiazem    Management plans discussed with the patient and she is in agreement.  CODE STATUS: Full  TOTAL TIME TAKING CARE OF THIS PATIENT: 30 minutes.     POSSIBLE D/C 2 days, DEPENDING ON CLINICAL CONDITION.   Minha Fulco M.D on 11/17/2018 at 9:39 AM  Between 7am to 6pm - Pager - [phone] After 6pm go to [email or web address] - password [email or web address] [facility]  Office  [phone]  CC: Primary care physician; [place]  Note: This dictation was prepared with Dragon dictation along with smaller phrase technology. Any transcriptional errors that result from this process are unintentional.

## 2018-11-17 NOTE — Progress Notes (Signed)
Pharmacy Electrolyte Monitoring Consult:  Pharmacy consulted to assist in monitoring and replacing electrolytes in this 58 y.o. female admitted on 11/16/2018 with Pain; Back Pain; and Urinary Tract Infection   Labs:  Sodium (mmol/L)  Date Value  11/17/2018 139   Potassium (mmol/L)  Date Value  11/17/2018 3.3 (L)   Magnesium (mg/dL)  Date Value  11/17/2018 1.4 (L)   Calcium (mg/dL)  Date Value  11/17/2018 7.8 (L)   Albumin (g/dL)  Date Value  11/16/2018 3.9    Assessment/Plan: K = 3.3, Mag = 1.4   KCL 40 meq x 1 dose. Mag 4g IV x 1 dose.  Continue NS/KCL 30meq at 75 ml/hr.  Will f/u labs in am   Olivia Canter, [facility] 11/17/2018 7:47 AM

## 2018-11-18 LAB — BASIC METABOLIC PANEL
Anion gap: 7 (ref 5–15)
BUN: 12 mg/dL (ref 6–20)
CALCIUM: 8.3 mg/dL — AB (ref 8.9–10.3)
CO2: 19 mmol/L — ABNORMAL LOW (ref 22–32)
Chloride: 112 mmol/L — ABNORMAL HIGH (ref 98–111)
Creatinine, Ser: 1.1 mg/dL — ABNORMAL HIGH (ref 0.44–1.00)
GFR calc Af Amer: >60 mL/min (ref >60)
GFR calc non Af Amer: 55 mL/min — ABNORMAL LOW (ref >60)
Glucose, Bld: 163 mg/dL — ABNORMAL HIGH (ref 70–99)
Potassium: 3.6 mmol/L (ref 3.5–5.1)
Sodium: 138 mmol/L (ref 135–145)

## 2018-11-18 LAB — CBC
HCT: 35 % — ABNORMAL LOW (ref 36.0–46.0)
Hemoglobin: 11.8 g/dL — ABNORMAL LOW (ref 12.0–15.0)
MCH: 27.3 pg (ref 26.0–34.0)
MCHC: 33.7 g/dL (ref 30.0–36.0)
MCV: 81 fL (ref 80.0–100.0)
Platelets: 260 10*3/uL (ref 150–400)
RBC: 4.32 MIL/uL (ref 3.87–5.11)
RDW: 13.5 % (ref 11.5–15.5)
WBC: 20.8 10*3/uL — ABNORMAL HIGH (ref 4.0–10.5)
nRBC: 0 % (ref 0.0–0.2)

## 2018-11-18 LAB — URINE CULTURE: Culture: >=100000 — AB

## 2018-11-18 LAB — GLUCOSE, CAPILLARY
GLUCOSE-CAPILLARY: 168 mg/dL — AB (ref 70–99)
Glucose-Capillary: 146 mg/dL — ABNORMAL HIGH (ref 70–99)
Glucose-Capillary: 117 mg/dL — ABNORMAL HIGH (ref 70–99)
Glucose-Capillary: 136 mg/dL — ABNORMAL HIGH (ref 70–99)

## 2018-11-18 LAB — MAGNESIUM: Magnesium: 2.3 mg/dL (ref 1.7–2.4)

## 2018-11-18 MED ORDER — KETOROLAC TROMETHAMINE 30 MG/ML IJ SOLN
30.0000 mg | Freq: Four times a day (QID) | INTRAMUSCULAR | Status: DC | PRN
  Administered 2018-11-18 (×2): 30 mg via INTRAVENOUS
  Filled 2018-11-18 (×2): qty 1

## 2018-11-18 MED ORDER — DIPHENHYDRAMINE HCL 12.5 MG/5ML PO ELIX: 12.5000 mg | ORAL_SOLUTION | Freq: Four times a day (QID) | ORAL | Status: DC | PRN

## 2018-11-18 MED ORDER — PHENAZOPYRIDINE HCL 100 MG PO TABS
100.0000 mg | ORAL_TABLET | Freq: Three times a day (TID) | ORAL | Status: DC
  Administered 2018-11-18 – 2018-11-19 (×2): 100 mg via ORAL
  Filled 2018-11-18 (×5): qty 1

## 2018-11-18 NOTE — Progress Notes (Signed)
Pharmacy Electrolyte Monitoring Consult:  Pharmacy consulted to assist in monitoring and replacing electrolytes in this 58 y.o. female admitted on 11/16/2018 with Pain; Back Pain; and Urinary Tract Infection   Labs:  Sodium (mmol/L)  Date Value  11/18/2018 138   Potassium (mmol/L)  Date Value  11/18/2018 3.6   Magnesium (mg/dL)  Date Value  11/18/2018 2.3   Calcium (mg/dL)  Date Value  11/18/2018 8.3 (L)   Albumin (g/dL)  Date Value  11/16/2018 3.9    Assessment/Plan: Electrolytes WNL. Continue current therapy. Will recheck electrolytes tomorrow with AM labs  Laural Benes, [facility] 11/18/2018 7:40 AM

## 2018-11-18 NOTE — Progress Notes (Signed)
[place] at [place] NAME: Laura Camacho    MR#:  [id number]  DATE OF BIRTH:  11-30-1960  SUBJECTIVE:   Patient admitted to the hospital secondary to sepsis secondary to acute pyelonephritis.  Seen by urology yesterday and thought to have nephrolithiasis but as per them no hydronephrosis on repeat ultrasound yesterday and no need for ureteral stent.  Continue supportive care for treatment of underlying pyelonephritis.  Patient still complaining of vague abdominal pain today but denies any nausea or vomiting.  Still having some low-grade fevers.  REVIEW OF SYSTEMS:    Review of Systems  Constitutional: Positive for fever. Negative for chills.  HENT: Negative for congestion and tinnitus.   Eyes: Negative for blurred vision and double vision.  Respiratory: Negative for cough, shortness of breath and wheezing.   Cardiovascular: Negative for chest pain, orthopnea and PND.  Gastrointestinal: Positive for abdominal pain. Negative for diarrhea, nausea and vomiting.  Genitourinary: Negative for dysuria and hematuria.  Neurological: Negative for dizziness, sensory change and focal weakness.  All other systems reviewed and are negative.   Nutrition: Carb modified Tolerating Diet: Yes Tolerating PT: Await Eval.    DRUG ALLERGIES:   Allergies  Allergen Reactions  . Codeine Anaphylaxis    "Throat swells up"  . Bupropion     Unknown  . Fluconazole Itching  . Ibuprofen Itching  . Iodine Hives  . Ivp Dye [Iodinated Diagnostic Agents] Hives  . Metformin And Related Itching  . Nizoral [Ketoconazole] Other (See Comments)    Dizzy/ Fainting  . Sulfa Antibiotics Hives  . Voltaren [Diclofenac Sodium] Diarrhea and Nausea And Vomiting  . Cefuroxime Rash  . Ciprofloxacin Rash    VITALS:  Blood pressure (!) 169/61, pulse 69, temperature 99.8 F (37.7 C), temperature source Oral, resp. rate 18, height 5' (1.524 m), weight 90.7 kg, SpO2 95  %.  PHYSICAL EXAMINATION:   Physical Exam  GENERAL:  58 y.o.-year-old patient lying in bed in no acute distress.  EYES: Pupils equal, round, reactive to light and accommodation. No scleral icterus. Extraocular muscles intact.  HEENT: Head atraumatic, normocephalic. Oropharynx and nasopharynx clear.  NECK:  Supple, no jugular venous distention. No thyroid enlargement, no tenderness.  LUNGS: Normal breath sounds bilaterally, no wheezing, rales, rhonchi. No use of accessory muscles of respiration.  CARDIOVASCULAR: S1, S2 normal. No murmurs, rubs, or gallops.  ABDOMEN: Soft, Tender in upper abdomen but no rebound, rigidity, nondistended. Bowel sounds present. No organomegaly or mass.  EXTREMITIES: No cyanosis, clubbing or edema b/l.    NEUROLOGIC: Cranial nerves II through XII are intact. No focal Motor or sensory deficits b/l.   PSYCHIATRIC: The patient is alert and oriented x 3.  SKIN: No obvious rash, lesion, or ulcer.    LABORATORY PANEL:   CBC Recent Labs  Lab 11/18/18 0546  WBC 20.8*  HGB 11.8*  HCT 35.0*  PLT 260   ------------------------------------------------------------------------------------------------------------------  Chemistries  Recent Labs  Lab 11/16/18 0252  11/18/18 0546  NA 142   < > 138  K 2.9*   < > 3.6  CL 108   < > 112*  CO2 23   < > 19*  GLUCOSE 194*   < > 163*  BUN 12   < > 12  CREATININE 1.31*   < > 1.10*  CALCIUM 9.3   < > 8.3*  MG 1.8   < > 2.3  AST 19  --   --  ALT 13  --   --   ALKPHOS 68  --   --   BILITOT 0.5  --   --    < > = values in this interval not displayed.   ------------------------------------------------------------------------------------------------------------------  Cardiac Enzymes No results for input(s): TROPONINI in the last 168 hours. ------------------------------------------------------------------------------------------------------------------  RADIOLOGY:  US Renal  Result Date: 11/17/2018 CLINICAL  DATA:  Patient with fever. EXAM: RENAL / URINARY TRACT ULTRASOUND COMPLETE COMPARISON:  CT abdomen pelvis 11/16/2018 FINDINGS: Right Kidney: Renal measurements: 11.9 x 5.1 x 5.6 cm = volume: 182 mL . Echogenicity within normal limits. No mass or hydronephrosis visualized. Left Kidney: Renal measurements: 12.0 x 7.4 x 6.1 cm = volume: 288 mL. Echogenicity within normal limits. No mass or hydronephrosis visualized. Bladder: Appears normal for degree of bladder distention. IMPRESSION: No hydronephrosis identified on current exam. Electronically Signed   By: Lovey Newcomer M.D.   On: 11/17/2018 10:46     ASSESSMENT AND PLAN:   58 year old female with past medical history of hypertension, hyperlipidemia, glaucoma, GERD, fibromyalgia, diabetes who presented to the hospital due to abdominal pain nausea and noted to have acute pyelonephritis.  1.  Sepsis-patient meets criteria given fever, leukocytosis and urinalysis and CT scan findings suggestive of pyelonephritis. - Continue IV ceftriaxone, IV fluids, follow cultures.  Urine cultures are positive for E. coli await blood cultures.  2.  Acute pyelonephritis-source of patient's sepsis. - Seen by urology given the patient's hydronephrosis and CT but patient has no nephrolithiasis and no need for further urologic intervention.  Continue supportive care with IV fluids, IV ceftriaxone, pain control.  White cell count is improving, urine cultures positive for E. coli and sensitive to ceftriaxone, await blood culture results.  3.  Leukocytosis-secondary to the pyelonephritis. -Follow with IV antibiotic therapy.    4.  Diabetes type 2 without complication-continue Lantus, sliding scale insulin.    5.  Essential hypertension-continue Bystolic.  6.  GERD-continue Protonix.  7.  Fibromyalgia/chronic pain- continue gabapentin.  Avoid IV narcotics.  All the records are reviewed and case discussed with Care Management/Social Worker. Management plans discussed with  the patient, family and they are in agreement.  CODE STATUS: Full code  DVT Prophylaxis: Lovenox  TOTAL TIME TAKING CARE OF THIS PATIENT: 30 minutes.   POSSIBLE D/C IN 1-2 DAYS, DEPENDING ON CLINICAL CONDITION.   Henreitta Leber M.D on 11/18/2018 at 12:26 PM  Between 7am to 6pm - Pager - [phone]  After 6pm go to [email or web address] - [facility]  Office  [phone]  CC: Primary care physician; [place]

## 2018-11-18 NOTE — Progress Notes (Addendum)
Pt complain of pain. Pt asked what she would normally take for pain at home. Pt states "I have a high pain tolerance and a lot of allergies". Pt again asked what she takes at home. Pt states she takes "prescription medications'. Pt asked if she knows the name of them. Pt states she does not. Pt offered tylenol and pt stated "That wont work, so I'm not gonna even take it". Asked pt if she can take percocet, norco or tramadol. Pt states she thinks she can take tramadol. VO given by MD to order tramadol. Cross allergy to codeine noted and this RN double checked with pt. Pt states "no that wont work". Again asked pt what she takes for pain. Pt refused to answer and turned over in bed. Notified MD. MD gave VO for toradol and benadryl PRN.

## 2018-11-18 NOTE — Plan of Care (Signed)
Discussed with patient plan of care for the rest of the evening, pain management and increased temperatures with some teach back displayed

## 2018-11-19 LAB — BASIC METABOLIC PANEL
Anion gap: 5 (ref 5–15)
BUN: 15 mg/dL (ref 6–20)
CO2: 20 mmol/L — ABNORMAL LOW (ref 22–32)
CREATININE: 1.18 mg/dL — AB (ref 0.44–1.00)
Calcium: 8.1 mg/dL — ABNORMAL LOW (ref 8.9–10.3)
Chloride: 114 mmol/L — ABNORMAL HIGH (ref 98–111)
GFR calc Af Amer: 59 mL/min — ABNORMAL LOW (ref >60)
GFR calc non Af Amer: 51 mL/min — ABNORMAL LOW (ref >60)
Glucose, Bld: 146 mg/dL — ABNORMAL HIGH (ref 70–99)
Potassium: 3.7 mmol/L (ref 3.5–5.1)
Sodium: 139 mmol/L (ref 135–145)

## 2018-11-19 LAB — CBC
HCT: 35.4 % — ABNORMAL LOW (ref 36.0–46.0)
Hemoglobin: 11.4 g/dL — ABNORMAL LOW (ref 12.0–15.0)
MCH: 26.3 pg (ref 26.0–34.0)
MCHC: 32.2 g/dL (ref 30.0–36.0)
MCV: 81.8 fL (ref 80.0–100.0)
Platelets: 276 10*3/uL (ref 150–400)
RBC: 4.33 MIL/uL (ref 3.87–5.11)
RDW: 13.6 % (ref 11.5–15.5)
WBC: 11.3 10*3/uL — ABNORMAL HIGH (ref 4.0–10.5)
nRBC: 0 % (ref 0.0–0.2)

## 2018-11-19 LAB — MAGNESIUM: Magnesium: 2.3 mg/dL (ref 1.7–2.4)

## 2018-11-19 LAB — GLUCOSE, CAPILLARY: Glucose-Capillary: 148 mg/dL — ABNORMAL HIGH (ref 70–99)

## 2018-11-19 MED ORDER — PHENAZOPYRIDINE HCL 100 MG PO TABS: 100.0000 mg | ORAL_TABLET | Freq: Three times a day (TID) | ORAL | 0 refills | Status: DC | PRN

## 2018-11-19 MED ORDER — CEPHALEXIN 500 MG PO CAPS: 500.0000 mg | ORAL_CAPSULE | Freq: Three times a day (TID) | ORAL | 0 refills | Status: AC

## 2018-11-19 NOTE — Care Management Important Message (Signed)
Important Message  Patient Details  Name: SUMMERS BUENDIA MRN: [id number] Date of Birth: 1961/10/30   Medicare Important Message Given:  Yes    Juliann Pulse A Chanel Mcadams 11/19/2018, 10:32 AM

## 2018-11-19 NOTE — Discharge Summary (Signed)
[place] at [place] NAME: Laura Camacho    MR#:  [id number]  DATE OF BIRTH:  01-19-1961  DATE OF ADMISSION:  11/16/2018 ADMITTING PHYSICIAN: Bettey Costa, MD  DATE OF DISCHARGE: 11/19/2018 12:27 PM  PRIMARY CARE PHYSICIAN: [place]    ADMISSION DIAGNOSIS:  Acute pyelonephritis [N10]  DISCHARGE DIAGNOSIS:  Active Problems:   Acute pyelonephritis   Other hydronephrosis   SECONDARY DIAGNOSIS:   Past Medical History:  Diagnosis Date  . Complication of anesthesia    STATES WAS TOLD NOT TO TAKE UNKNOWN ANESTHESIA AFTER NECKFUSION OR HYSTERECTOMY  . Diabetes mellitus without complication ([place])   . Fibromyalgia   . GERD (gastroesophageal reflux disease)   . Glaucoma (increased eye pressure)    History of Glaucoma but no drops  . Headache   . Hyperlipidemia   . Hypertension   . Neurofibromatosis [facility])     HOSPITAL COURSE:   58 year old female with past medical history of hypertension, hyperlipidemia, glaucoma, GERD, fibromyalgia, diabetes who presented to the hospital due to abdominal pain nausea and noted to have acute pyelonephritis.  1.  Sepsis-patient meets criteria given fever, leukocytosis and urinalysis and CT scan findings suggestive of pyelonephritis. -Patient was treated with IV ceftriaxone, given IV fluids.  Patient's urine cultures were positive for E. coli which was fairly sensitive.  She has been afebrile for the past 24 hours, blood cultures are negative, her white cell count has improved.  She is now being discharged on oral Keflex for additional 7 days.  2.  Acute pyelonephritis-source of patient's sepsis. -Initially on admission patient had a CT scan which showed hydronephrosis and suspected nephrolithiasis.  Urology consult was obtained after their evaluation did not think the patient had nephrolithiasis but most of her changes related to her infection and pyelonephritis.  Did not recommend any  urological intervention.  Patient was treated supportively with IV fluids, IV antibiotics, pain control.  She has improved.  Her white cell count has trended down, she is afebrile and hemodynamically stable.  Her blood cultures are negative.  She is now being discharged on oral Keflex for additional 7 days. - she was given some Pyridium for bladder spasms/dysuria.   3.  Leukocytosis-secondary to the pyelonephritis. -Improved with IV antibiotic therapy.   4.  Diabetes type 2 without complication- patient will resume her Lantus at home.   5.  Essential hypertension- pt. Will continue Bystolic.  6.  GERD- pt. Will cont. Her Omeprazole. .  7.  Fibromyalgia/chronic pain- pt. Will continue gabapentin.   DISCHARGE CONDITIONS:   Stable.   CONSULTS OBTAINED:  Treatment Team:  Hollice Espy, MD  DRUG ALLERGIES:   Allergies  Allergen Reactions  . Codeine Anaphylaxis    "Throat swells up"  . Bupropion     Unknown  . Fluconazole Itching  . Ibuprofen Itching  . Iodine Hives  . Ivp Dye [Iodinated Diagnostic Agents] Hives  . Metformin And Related Itching  . Nizoral [Ketoconazole] Other (See Comments)    Dizzy/ Fainting  . Sulfa Antibiotics Hives  . Voltaren [Diclofenac Sodium] Diarrhea and Nausea And Vomiting  . Cefuroxime Rash  . Ciprofloxacin Rash    DISCHARGE MEDICATIONS:   Allergies as of 11/19/2018      Reactions   Codeine Anaphylaxis   "Throat swells up"   Bupropion    Unknown   Fluconazole Itching   Ibuprofen Itching   Iodine Hives   Ivp Dye [iodinated Diagnostic Agents] Hives   Metformin  And Related Itching   Nizoral [ketoconazole] Other (See Comments)   Dizzy/ Fainting   Sulfa Antibiotics Hives   Voltaren [diclofenac Sodium] Diarrhea, Nausea And Vomiting   Cefuroxime Rash   Ciprofloxacin Rash      Medication List    TAKE these medications   atorvastatin 20 MG tablet Commonly known as:  LIPITOR Take 20 mg by mouth daily.   BYSTOLIC 20 MG  Tabs Generic drug:  Nebivolol HCl Take 20 mg by mouth daily.   cephALEXin 500 MG capsule Commonly known as:  KEFLEX Take 1 capsule (500 mg total) by mouth 3 (three) times daily for 7 days.   dexlansoprazole 60 MG capsule Commonly known as:  DEXILANT Take 60 mg by mouth daily.   diltiazem 180 MG 24 hr capsule Commonly known as:  DILACOR XR Take 360 mg by mouth at bedtime.   fluticasone 50 MCG/ACT nasal spray Commonly known as:  FLONASE Place 1 spray into both nostrils daily as needed for allergies or rhinitis.   gabapentin 300 MG capsule Commonly known as:  NEURONTIN Take 300-600 mg by mouth at bedtime as needed (pain-pt takes 1-2 capsules at bedtime PRN).   LANTUS SOLOSTAR 100 UNIT/ML Solostar Pen Generic drug:  Insulin Glargine Inject 30 Units into the skin at bedtime.   omeprazole 40 MG capsule Commonly known as:  PRILOSEC Take 40 mg by mouth at bedtime.   phenazopyridine 100 MG tablet Commonly known as:  PYRIDIUM Take 1 tablet (100 mg total) by mouth 3 (three) times daily as needed for pain.   STOOL SOFTENER PO Take 1 capsule by mouth daily.         DISCHARGE INSTRUCTIONS:   DIET:  Cardiac diet and Diabetic diet  DISCHARGE CONDITION:  Stable  ACTIVITY:  Activity as tolerated  OXYGEN:  Home Oxygen: No.   Oxygen Delivery: room air  DISCHARGE LOCATION:  home   If you experience worsening of your admission symptoms, develop shortness of breath, life threatening emergency, suicidal or homicidal thoughts you must seek medical attention immediately by calling 911 or calling your MD immediately  if symptoms less severe.  You Must read complete instructions/literature along with all the possible adverse reactions/side effects for all the Medicines you take and that have been prescribed to you. Take any new Medicines after you have completely understood and accpet all the possible adverse reactions/side effects.   Please note  You were cared for by a  hospitalist during your hospital stay. If you have any questions about your discharge medications or the care you received while you were in the hospital after you are discharged, you can call the unit and asked to speak with the hospitalist on call if the hospitalist that took care of you is not available. Once you are discharged, your primary care physician will handle any further medical issues. Please note that NO REFILLS for any discharge medications will be authorized once you are discharged, as it is imperative that you return to your primary care physician (or establish a relationship with a primary care physician if you do not have one) for your aftercare needs so that they can reassess your need for medications and monitor your lab values.     Today   Afebrile, hemodynamically stable.  White cell count is improved.  Blood cultures are negative.  Will discharge on oral antibiotics today.  Still has some abdominal pain but improved since admission.  VITAL SIGNS:  Blood pressure (!) 150/70, pulse (!) 49, temperature 98.6  F (37 C), temperature source Oral, resp. rate 16, height 5' (1.524 m), weight 90.7 kg, SpO2 93 %.  I/O:    Intake/Output Summary (Last 24 hours) at 11/19/2018 1533 Last data filed at 11/19/2018 0647 Gross per 24 hour  Intake 2093.39 ml  Output -  Net 2093.39 ml    PHYSICAL EXAMINATION:   GENERAL:  58 y.o.-year-old patient lying in bed in no acute distress.  EYES: Pupils equal, round, reactive to light and accommodation. No scleral icterus. Extraocular muscles intact.  HEENT: Head atraumatic, normocephalic. Oropharynx and nasopharynx clear.  NECK:  Supple, no jugular venous distention. No thyroid enlargement, no tenderness.  LUNGS: Normal breath sounds bilaterally, no wheezing, rales, rhonchi. No use of accessory muscles of respiration.  CARDIOVASCULAR: S1, S2 normal. No murmurs, rubs, or gallops.  ABDOMEN: Soft, Tender in upper abdomen but no rebound, rigidity,  nondistended. Bowel sounds present. No organomegaly or mass.  EXTREMITIES: No cyanosis, clubbing or edema b/l.    NEUROLOGIC: Cranial nerves II through XII are intact. No focal Motor or sensory deficits b/l.   PSYCHIATRIC: The patient is alert and oriented x 3.  SKIN: No obvious rash, lesion, or ulcer.   DATA REVIEW:   CBC Recent Labs  Lab 11/19/18 0235  WBC 11.3*  HGB 11.4*  HCT 35.4*  PLT 276    Chemistries  Recent Labs  Lab 11/16/18 0252  11/19/18 0235  NA 142   < > 139  K 2.9*   < > 3.7  CL 108   < > 114*  CO2 23   < > 20*  GLUCOSE 194*   < > 146*  BUN 12   < > 15  CREATININE 1.31*   < > 1.18*  CALCIUM 9.3   < > 8.1*  MG 1.8   < > 2.3  AST 19  --   --   ALT 13  --   --   ALKPHOS 68  --   --   BILITOT 0.5  --   --    < > = values in this interval not displayed.    Cardiac Enzymes No results for input(s): TROPONINI in the last 168 hours.  Microbiology Results  Results for orders placed or performed during the hospital encounter of 11/16/18  Urine culture     Status: Abnormal   Collection Time: 11/16/18  4:29 AM  Result Value Ref Range Status   Specimen Description   Final    URINE, CATHETERIZED Performed at [facility], [address] [place], [place] [address]    Special Requests   Final    NONE Performed at [facility], [address] [place], [place] [address]    Culture >=100,000 COLONIES/mL ESCHERICHIA COLI (A)  Final   Report Status 11/18/2018 FINAL  Final   Organism ID, Bacteria ESCHERICHIA COLI (A)  Final      Susceptibility   Escherichia coli - MIC*    AMPICILLIN >=32 RESISTANT Resistant     CEFAZOLIN 16 SENSITIVE Sensitive     CEFTRIAXONE <=1 SENSITIVE Sensitive     CIPROFLOXACIN <=0.25 SENSITIVE Sensitive     GENTAMICIN <=1 SENSITIVE Sensitive     IMIPENEM <=0.25 SENSITIVE Sensitive     NITROFURANTOIN <=16 SENSITIVE Sensitive     TRIMETH/SULFA <=20 SENSITIVE Sensitive     AMPICILLIN/SULBACTAM >=32 RESISTANT  Resistant     PIP/TAZO >=128 RESISTANT Resistant     Extended ESBL NEGATIVE Sensitive     * >=100,000 COLONIES/mL ESCHERICHIA COLI  CULTURE, BLOOD (ROUTINE X 2) w Reflex to ID Panel     Status: None (Preliminary result)   Collection Time: 11/18/18  9:28 AM  Result Value Ref Range Status   Specimen Description BLOOD LEFT WRIST  Final   Special Requests   Final    BOTTLES DRAWN AEROBIC AND ANAEROBIC Blood Culture adequate volume   Culture   Final    NO GROWTH < 24 HOURS Performed at [facility], [address], [place], [place] [address]    Report Status PENDING  Incomplete  CULTURE, BLOOD (ROUTINE X 2) w Reflex to ID Panel     Status: None (Preliminary result)   Collection Time: 11/18/18  9:34 AM  Result Value Ref Range Status   Specimen Description BLOOD LT [facility]  Final   Special Requests   Final    BOTTLES DRAWN AEROBIC AND ANAEROBIC Blood Culture results may not be optimal due to an excessive volume of blood received in culture bottles   Culture   Final    NO GROWTH < 24 HOURS Performed at [facility], [address] [place], [place] [address]    Report Status PENDING  Incomplete    RADIOLOGY:  No results found.    Management plans discussed with the patient, family and they are in agreement.  CODE STATUS:  Code Status History    Date Active Date Inactive Code Status Order ID Comments User Context   11/16/2018 1612 11/19/2018 1532 Full Code [id number]  Bettey Costa, MD ED      TOTAL TIME TAKING CARE OF THIS PATIENT: 40 minutes.    Henreitta Leber M.D on 11/19/2018 at 3:33 PM  Between 7am to 6pm - Pager - [phone]  After 6pm go to [email or web address] - [employer] [facility]  Office  [phone]  CC: Primary care physician; [place]

## 2018-11-19 NOTE — Progress Notes (Signed)
Pharmacy Electrolyte Monitoring Consult:  Pharmacy consulted to assist in monitoring and replacing electrolytes in this 58 y.o. female admitted on 11/16/2018 with Pain; Back Pain; and Urinary Tract Infection  Labs:  Sodium (mmol/L)  Date Value  11/19/2018 139   Potassium (mmol/L)  Date Value  11/19/2018 3.7   Magnesium (mg/dL)  Date Value  11/19/2018 2.3   Calcium (mg/dL)  Date Value  11/19/2018 8.1 (L)   Albumin (g/dL)  Date Value  11/16/2018 3.9    Assessment/Plan: Electrolytes WNL. Continue current therapy. Will recheck electrolytes tomorrow with AM labs  Pernell Dupre, PharmD, BCPS Clinical Pharmacist 11/19/2018 8:24 AM

## 2018-11-23 LAB — CULTURE, BLOOD (ROUTINE X 2)
Culture: NO GROWTH
Culture: NO GROWTH
Special Requests: ADEQUATE

## 2019-07-16 ENCOUNTER — Other Ambulatory Visit: Payer: Self-pay | Admitting: Nurse Practitioner

## 2019-07-16 DIAGNOSIS — Z1231 Encounter for screening mammogram for malignant neoplasm of breast: Secondary | ICD-10-CM

## 2019-07-16 DIAGNOSIS — R928 Other abnormal and inconclusive findings on diagnostic imaging of breast: Secondary | ICD-10-CM

## 2019-07-16 DIAGNOSIS — N63 Unspecified lump in unspecified breast: Secondary | ICD-10-CM

## 2019-08-07 ENCOUNTER — Ambulatory Visit
Admission: RE | Admit: 2019-08-07 | Discharge: 2019-08-07 | Disposition: A | Discharge status: Home / Self Care | Payer: Medicare HMO | Source: Ambulatory Visit | Attending: Nurse Practitioner | Admitting: Nurse Practitioner

## 2019-08-07 ENCOUNTER — Other Ambulatory Visit: Payer: Self-pay | Admitting: Nurse Practitioner

## 2019-08-07 DIAGNOSIS — M25512 Pain in left shoulder: Secondary | ICD-10-CM

## 2019-08-07 DIAGNOSIS — M2560 Stiffness of unspecified joint, not elsewhere classified: Secondary | ICD-10-CM

## 2019-11-26 ENCOUNTER — Inpatient Hospital Stay: Admission: RE | Admit: 2019-11-26 | Payer: Medicare HMO | Source: Ambulatory Visit

## 2019-11-26 ENCOUNTER — Other Ambulatory Visit: Payer: Medicare HMO

## 2020-04-23 ENCOUNTER — Ambulatory Visit
Admission: RE | Admit: 2020-04-23 | Discharge: 2020-04-23 | Disposition: A | Discharge status: Home / Self Care | Payer: Medicare HMO | Source: Ambulatory Visit | Attending: Nurse Practitioner | Admitting: Nurse Practitioner

## 2020-04-23 DIAGNOSIS — Z1231 Encounter for screening mammogram for malignant neoplasm of breast: Secondary | ICD-10-CM

## 2020-04-23 DIAGNOSIS — R928 Other abnormal and inconclusive findings on diagnostic imaging of breast: Secondary | ICD-10-CM | POA: Diagnosis not present

## 2020-04-23 DIAGNOSIS — N63 Unspecified lump in unspecified breast: Secondary | ICD-10-CM

## 2020-05-16 IMAGING — MG DIGITAL DIAGNOSTIC UNILATERAL LEFT MAMMOGRAM WITH TOMO AND CAD
4 series · 4 of 12 positions shown · non-contrast
Comparison: Previous exam(s).

CLINICAL DATA: Screening recall for a possible left breast mass.

EXAM:
DIGITAL DIAGNOSTIC LEFT MAMMOGRAM WITH CAD AND TOMO
ULTRASOUND LEFT BREAST

[L ML synth-2D]
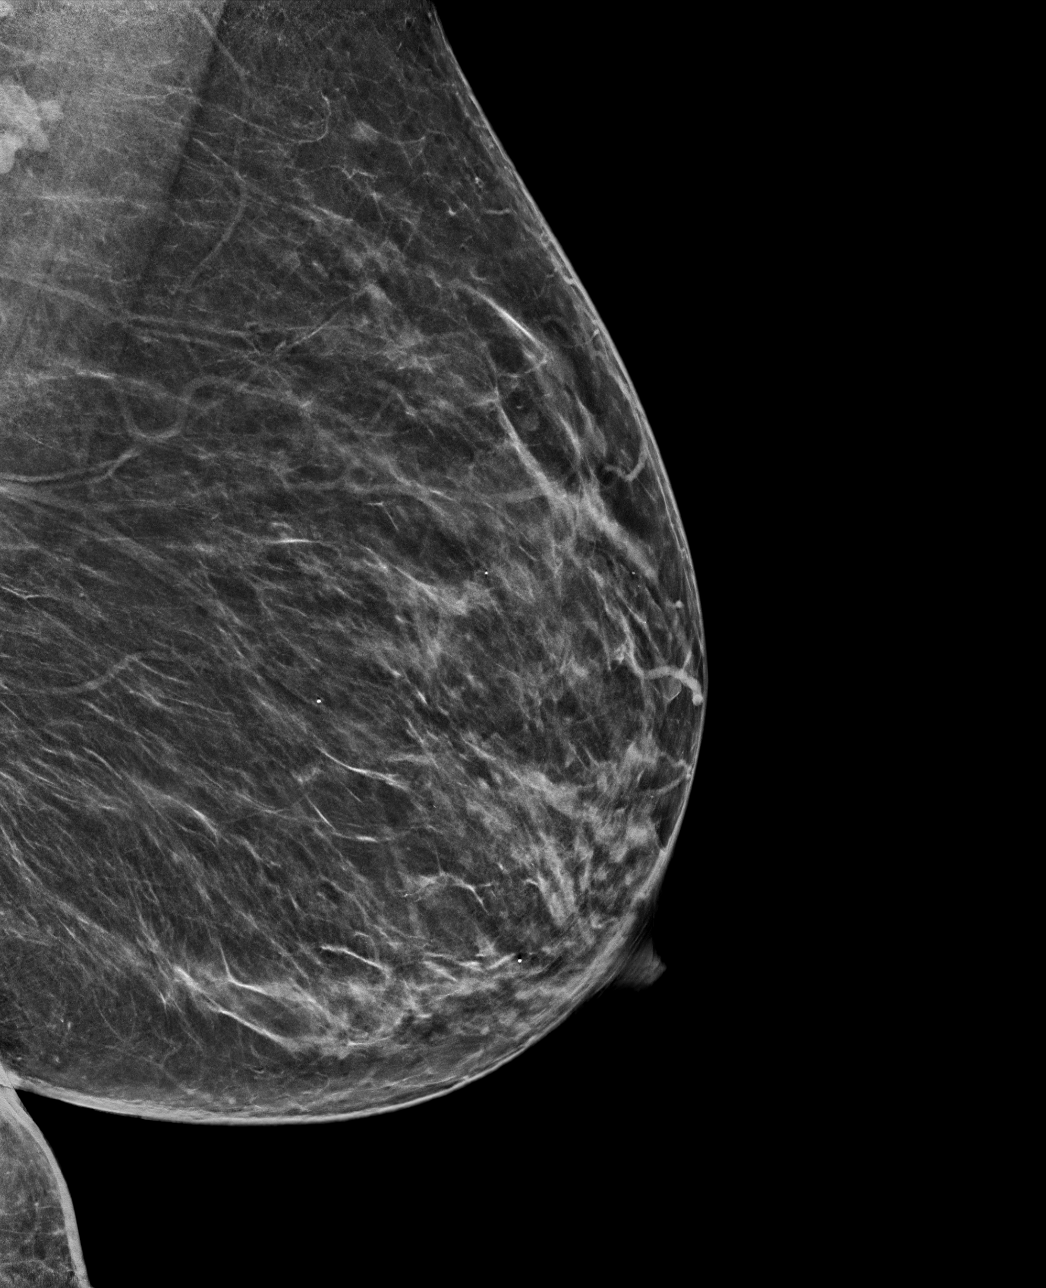

[L CC synth-2D]
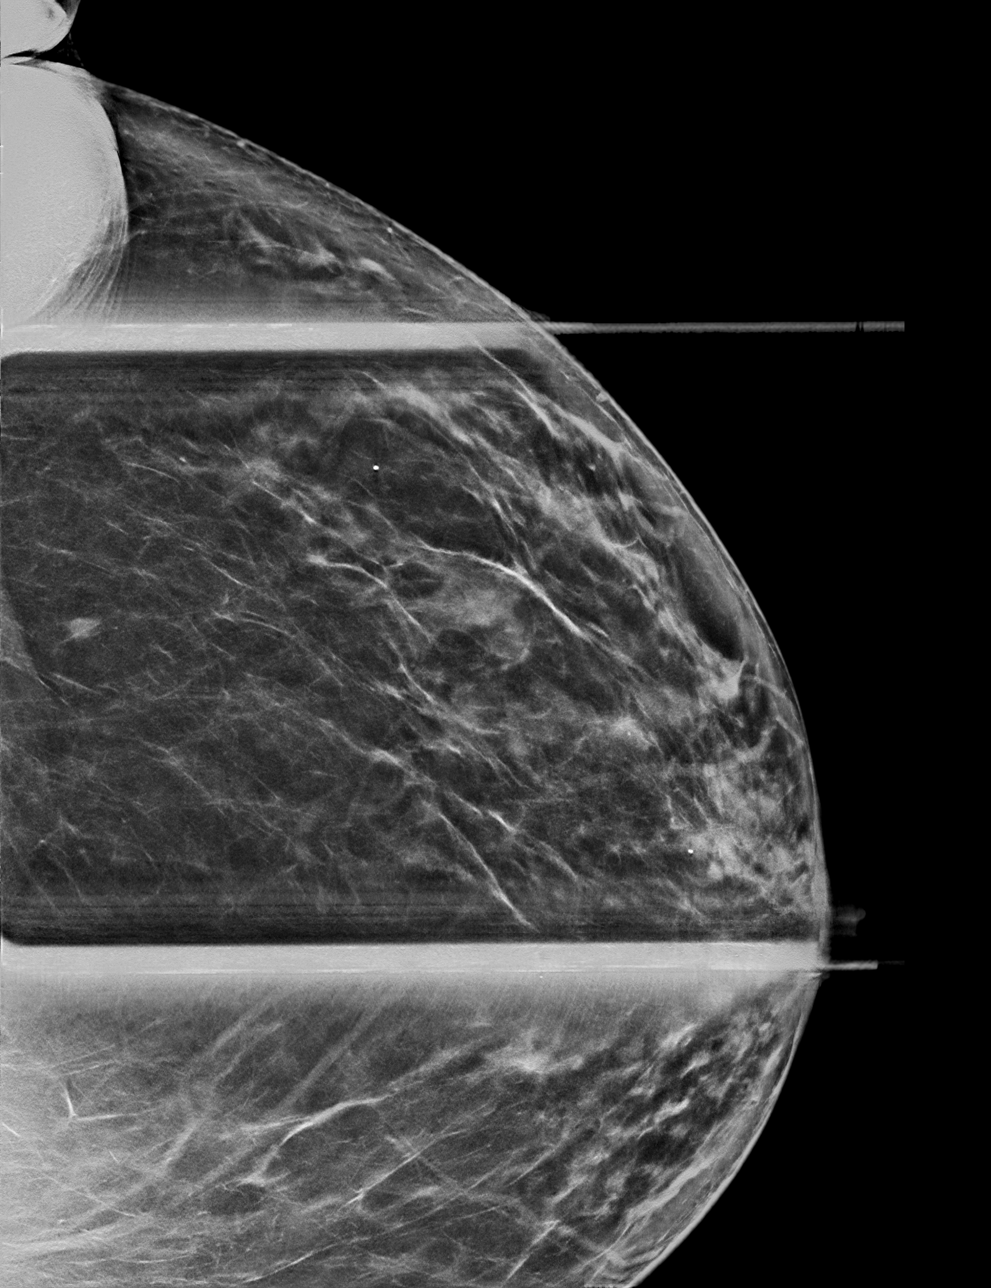

[L CC tomo · tomo slice 40/79.0]
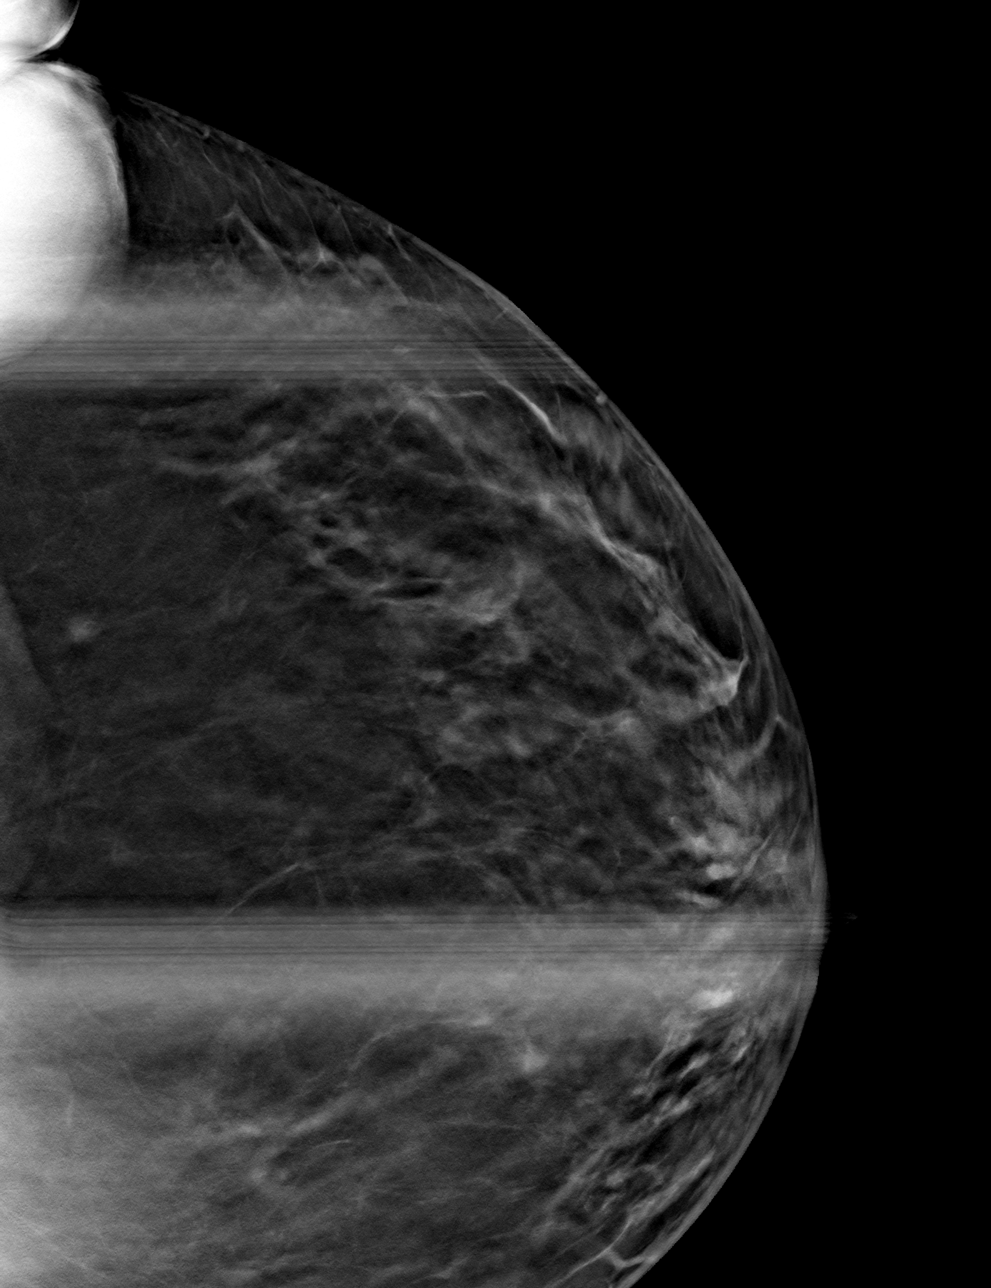

[L ML tomo · tomo slice 37/72.0]
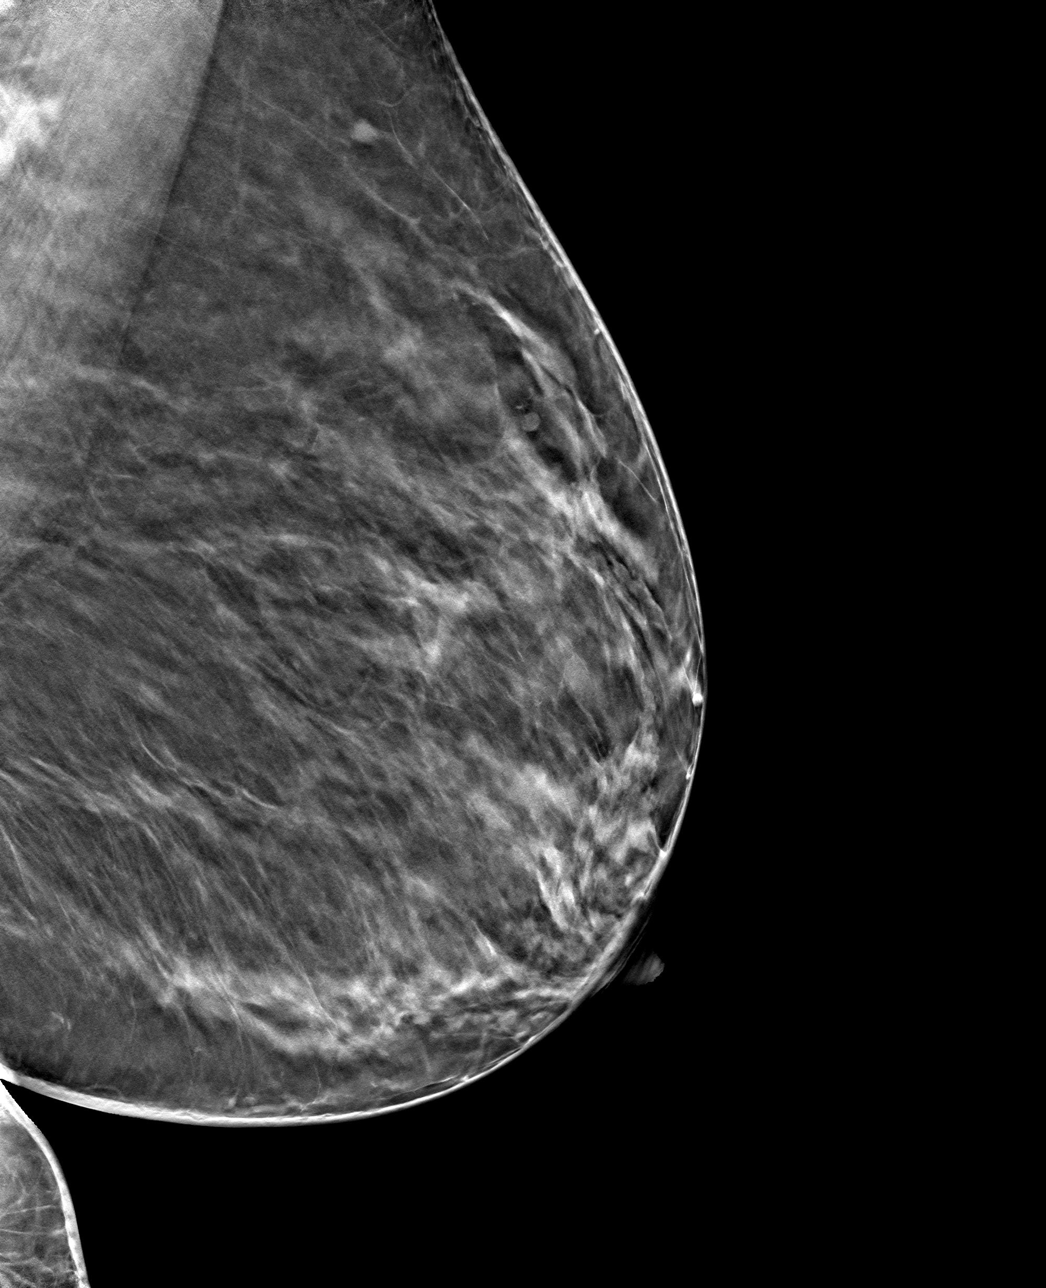

[4 of 12 positions shown; findings below may reference images not displayed]

ACR Breast Density Category b: There are scattered areas of
fibroglandular density.
FINDINGS: In the upper-outer posterior left breast there is a 4 mm superficial
mass.

Mammographic images were processed with CAD.

Ultrasound of the superior left breast demonstrates a benign
septated cyst at 1 o'clock, 8 cm from the nipple measuring 4 x 3 x 4
mm. In the left breast at [DATE], 12 cm from the nipple there is a
hypoechoic round mass with indistinct margins measuring 4 x 4 x 4
mm. No blood flow seen within the mass on color Doppler imaging.

Ultrasound of the left axilla demonstrates a prominent lymph node
with a cortex measuring 4 mm.
IMPRESSION: 1.  There is an indeterminate 4 mm mass in the left breast at 12 30.

2. There is an indeterminate left axillary lymph node with cortex
measuring 4 mm.

RECOMMENDATION:
Ultrasound guided biopsy is recommended for the left breast mass and
the left axillary lymph node.

I have discussed the findings and recommendations with the patient.
Results were also provided in writing at the conclusion of the
visit. If applicable, a reminder letter will be sent to the patient
regarding the next appointment.

BI-RADS CATEGORY  4: Suspicious.

## 2020-06-01 ENCOUNTER — Other Ambulatory Visit: Payer: Self-pay | Admitting: Nurse Practitioner

## 2020-06-01 DIAGNOSIS — M5442 Lumbago with sciatica, left side: Secondary | ICD-10-CM

## 2020-06-01 DIAGNOSIS — M542 Cervicalgia: Secondary | ICD-10-CM

## 2020-06-26 ENCOUNTER — Other Ambulatory Visit: Payer: Self-pay

## 2020-06-26 ENCOUNTER — Other Ambulatory Visit
Admission: RE | Admit: 2020-06-26 | Discharge: 2020-06-26 | Disposition: A | Discharge status: Home / Self Care | Payer: Medicare HMO | Source: Ambulatory Visit | Attending: Gastroenterology | Admitting: Gastroenterology

## 2020-06-26 DIAGNOSIS — Z01812 Encounter for preprocedural laboratory examination: Secondary | ICD-10-CM | POA: Insufficient documentation

## 2020-06-26 DIAGNOSIS — Z20822 Contact with and (suspected) exposure to covid-19: Secondary | ICD-10-CM | POA: Insufficient documentation

## 2020-06-27 LAB — SARS CORONAVIRUS 2 (TAT 6-24 HRS): SARS Coronavirus 2: NEGATIVE

## 2020-06-29 ENCOUNTER — Ambulatory Visit
Admission: RE | Admit: 2020-06-29 | Discharge: 2020-06-29 | Disposition: A | Discharge status: Home / Self Care | Payer: Medicare HMO | Source: Ambulatory Visit | Attending: Nurse Practitioner | Admitting: Nurse Practitioner

## 2020-06-29 ENCOUNTER — Encounter: Payer: Self-pay | Admitting: *Deleted

## 2020-06-29 ENCOUNTER — Other Ambulatory Visit: Payer: Self-pay

## 2020-06-29 DIAGNOSIS — M5441 Lumbago with sciatica, right side: Secondary | ICD-10-CM | POA: Diagnosis present

## 2020-06-29 DIAGNOSIS — G8929 Other chronic pain: Secondary | ICD-10-CM | POA: Insufficient documentation

## 2020-06-29 DIAGNOSIS — M542 Cervicalgia: Secondary | ICD-10-CM

## 2020-06-29 DIAGNOSIS — M5442 Lumbago with sciatica, left side: Secondary | ICD-10-CM | POA: Diagnosis present

## 2020-06-30 ENCOUNTER — Other Ambulatory Visit: Payer: Self-pay

## 2020-06-30 ENCOUNTER — Encounter
Admission: RE | Disposition: A | Discharge status: Home / Self Care | Payer: Self-pay | Source: Home / Self Care | Attending: Gastroenterology

## 2020-06-30 ENCOUNTER — Ambulatory Visit
Admission: RE | Admit: 2020-06-30 | Discharge: 2020-06-30 | Disposition: A | Discharge status: Home / Self Care | Payer: Medicare HMO | Attending: Gastroenterology | Admitting: Gastroenterology

## 2020-06-30 ENCOUNTER — Ambulatory Visit: Payer: Medicare HMO | Admitting: Anesthesiology

## 2020-06-30 ENCOUNTER — Encounter: Payer: Self-pay | Admitting: *Deleted

## 2020-06-30 DIAGNOSIS — Z87892 Personal history of anaphylaxis: Secondary | ICD-10-CM | POA: Diagnosis not present

## 2020-06-30 DIAGNOSIS — E119 Type 2 diabetes mellitus without complications: Secondary | ICD-10-CM | POA: Insufficient documentation

## 2020-06-30 DIAGNOSIS — Z794 Long term (current) use of insulin: Secondary | ICD-10-CM | POA: Diagnosis not present

## 2020-06-30 DIAGNOSIS — K3189 Other diseases of stomach and duodenum: Secondary | ICD-10-CM | POA: Diagnosis not present

## 2020-06-30 DIAGNOSIS — I10 Essential (primary) hypertension: Secondary | ICD-10-CM | POA: Insufficient documentation

## 2020-06-30 DIAGNOSIS — Z883 Allergy status to other anti-infective agents status: Secondary | ICD-10-CM | POA: Insufficient documentation

## 2020-06-30 DIAGNOSIS — D130 Benign neoplasm of esophagus: Secondary | ICD-10-CM | POA: Diagnosis not present

## 2020-06-30 DIAGNOSIS — H409 Unspecified glaucoma: Secondary | ICD-10-CM | POA: Diagnosis not present

## 2020-06-30 DIAGNOSIS — F17200 Nicotine dependence, unspecified, uncomplicated: Secondary | ICD-10-CM | POA: Diagnosis not present

## 2020-06-30 DIAGNOSIS — D124 Benign neoplasm of descending colon: Secondary | ICD-10-CM | POA: Insufficient documentation

## 2020-06-30 DIAGNOSIS — Z885 Allergy status to narcotic agent status: Secondary | ICD-10-CM | POA: Diagnosis not present

## 2020-06-30 DIAGNOSIS — K449 Diaphragmatic hernia without obstruction or gangrene: Secondary | ICD-10-CM | POA: Diagnosis not present

## 2020-06-30 DIAGNOSIS — D122 Benign neoplasm of ascending colon: Secondary | ICD-10-CM | POA: Diagnosis not present

## 2020-06-30 DIAGNOSIS — M797 Fibromyalgia: Secondary | ICD-10-CM | POA: Insufficient documentation

## 2020-06-30 DIAGNOSIS — Z882 Allergy status to sulfonamides status: Secondary | ICD-10-CM | POA: Diagnosis not present

## 2020-06-30 DIAGNOSIS — K219 Gastro-esophageal reflux disease without esophagitis: Secondary | ICD-10-CM | POA: Diagnosis present

## 2020-06-30 DIAGNOSIS — Z888 Allergy status to other drugs, medicaments and biological substances status: Secondary | ICD-10-CM | POA: Diagnosis not present

## 2020-06-30 DIAGNOSIS — Z881 Allergy status to other antibiotic agents status: Secondary | ICD-10-CM | POA: Insufficient documentation

## 2020-06-30 DIAGNOSIS — Q8500 Neurofibromatosis, unspecified: Secondary | ICD-10-CM | POA: Insufficient documentation

## 2020-06-30 DIAGNOSIS — E785 Hyperlipidemia, unspecified: Secondary | ICD-10-CM | POA: Diagnosis not present

## 2020-06-30 DIAGNOSIS — Z1211 Encounter for screening for malignant neoplasm of colon: Secondary | ICD-10-CM | POA: Insufficient documentation

## 2020-06-30 DIAGNOSIS — Z79899 Other long term (current) drug therapy: Secondary | ICD-10-CM | POA: Diagnosis not present

## 2020-06-30 DIAGNOSIS — Z886 Allergy status to analgesic agent status: Secondary | ICD-10-CM | POA: Insufficient documentation

## 2020-06-30 HISTORY — PX: COLONOSCOPY WITH PROPOFOL: SHX5780

## 2020-06-30 HISTORY — PX: ESOPHAGOGASTRODUODENOSCOPY (EGD) WITH PROPOFOL: SHX5813

## 2020-06-30 LAB — GLUCOSE, CAPILLARY
Glucose-Capillary: 400 mg/dL — ABNORMAL HIGH (ref 70–99)
Glucose-Capillary: 379 mg/dL — ABNORMAL HIGH (ref 70–99)

## 2020-06-30 SURGERY — ESOPHAGOGASTRODUODENOSCOPY (EGD) WITH PROPOFOL
Anesthesia: General

## 2020-06-30 MED ORDER — FENTANYL CITRATE (PF) 100 MCG/2ML IJ SOLN
INTRAMUSCULAR | Status: AC
  Filled 2020-06-30: qty 2

## 2020-06-30 MED ORDER — FENTANYL CITRATE (PF) 100 MCG/2ML IJ SOLN
INTRAMUSCULAR | Status: DC | PRN
  Administered 2020-06-30: 50 ug via INTRAVENOUS

## 2020-06-30 MED ORDER — PROPOFOL 10 MG/ML IV BOLUS
INTRAVENOUS | Status: DC | PRN
  Administered 2020-06-30: 40 mg via INTRAVENOUS
  Administered 2020-06-30 (×2): 20 mg via INTRAVENOUS

## 2020-06-30 MED ORDER — SODIUM CHLORIDE 0.9 % IV SOLN
INTRAVENOUS | Status: DC
  Administered 2020-06-30: 1000 mL via INTRAVENOUS

## 2020-06-30 MED ORDER — PROPOFOL 500 MG/50ML IV EMUL
INTRAVENOUS | Status: AC
  Filled 2020-06-30: qty 50

## 2020-06-30 MED ORDER — PROPOFOL 500 MG/50ML IV EMUL
INTRAVENOUS | Status: DC | PRN
  Administered 2020-06-30: 150 ug/kg/min via INTRAVENOUS

## 2020-06-30 NOTE — Transfer of Care (Signed)
Immediate Anesthesia Transfer of Care Note  Patient: Laura Camacho  Procedure(s) Performed: ESOPHAGOGASTRODUODENOSCOPY (EGD) WITH PROPOFOL (N/A ) COLONOSCOPY WITH PROPOFOL (N/A )  Patient Location: PACU  Anesthesia Type:General  Level of Consciousness: awake and alert   Airway & Oxygen Therapy: Patient Spontanous Breathing and Patient connected to nasal cannula oxygen  Post-op Assessment: Report given to RN and Post -op Vital signs reviewed and stable  Post vital signs: Reviewed and stable  Last Vitals:  Vitals Value Taken Time  BP 135/70 06/30/20 1200  Temp 36 C 06/30/20 1200  Pulse 67 06/30/20 1201  Resp 15 06/30/20 1201  SpO2 99 % 06/30/20 1201  Vitals shown include unvalidated device data.  Last Pain:  Vitals:   06/30/20 1200  TempSrc: Temporal  PainSc: Asleep         Complications: No complications documented.

## 2020-06-30 NOTE — Op Note (Signed)
[facility] Gastroenterology Patient Name: [place] Procedure Date: 06/30/2020 11:17 AM MRN: [id number] Account #: [id number] Date of Birth: 10-Apr-1961 Admit Type: Outpatient Age: 59 Room: [facility] ENDO ROOM 3 Gender: Female Note Status: Finalized Procedure:             Colonoscopy Indications:           Screening for colorectal malignant neoplasm Providers:             Andrey Farmer MD, MD Referring MD:          Perrin Maltese, MD (Referring MD) Medicines:             Monitored Anesthesia Care Complications:         No immediate complications. Estimated blood loss:                         Minimal. Procedure:             Pre-Anesthesia Assessment:                        - Prior to the procedure, a History and Physical was                         performed, and patient medications and allergies were                         reviewed. The patient is competent. The risks and                         benefits of the procedure and the sedation options and                         risks were discussed with the patient. All questions                         were answered and informed consent was obtained.                         Patient identification and proposed procedure were                         verified by the physician, the nurse, the anesthetist                         and the technician in the endoscopy suite. Mental                         Status Examination: alert and oriented. Airway                         Examination: normal oropharyngeal airway and neck                         mobility. Respiratory Examination: clear to                         auscultation. CV Examination: normal. Prophylactic  Antibiotics: The patient does not require prophylactic                         antibiotics. Prior Anticoagulants: The patient has                         taken no previous anticoagulant or antiplatelet                         agents.  ASA Grade Assessment: III - A patient with                         severe systemic disease. After reviewing the risks and                         benefits, the patient was deemed in satisfactory                         condition to undergo the procedure. The anesthesia                         plan was to use monitored anesthesia care (MAC).                         Immediately prior to administration of medications,                         the patient was re-assessed for adequacy to receive                         sedatives. The heart rate, respiratory rate, oxygen                         saturations, blood pressure, adequacy of pulmonary                         ventilation, and response to care were monitored                         throughout the procedure. The physical status of the                         patient was re-assessed after the procedure.                        After obtaining informed consent, the colonoscope was                         passed under direct vision. Throughout the procedure,                         the patient's blood pressure, pulse, and oxygen                         saturations were monitored continuously. The                         Colonoscope was introduced through the anus with the  intention of advancing to the cecum. The scope was                         advanced to the ascending colon before the procedure                         was aborted. Medications were given. The colonoscopy                         was technically difficult and complex due to poor                         bowel prep with stool present. The patient tolerated                         the procedure well. The quality of the bowel                         preparation was unsatisfactory. Findings:      The perianal and digital rectal examinations were normal.      A 2 mm polyp was found in the ascending colon. The polyp was sessile.       The polyp was removed  with a jumbo cold forceps. Resection and retrieval       were complete. Estimated blood loss was minimal.      A 2 mm polyp was found in the descending colon. The polyp was sessile.       The polyp was removed with a jumbo cold forceps. Resection and retrieval       were complete. Estimated blood loss was minimal.      Solid stool was found in the ascending colon, precluding visualization. Impression:            - Preparation of the colon was unsatisfactory.                        - One 2 mm polyp in the ascending colon, removed with                         a jumbo cold forceps. Resected and retrieved.                        - One 2 mm polyp in the descending colon, removed with                         a jumbo cold forceps. Resected and retrieved.                        - Stool in the ascending colon. Recommendation:        - Discharge patient to home.                        - Resume previous diet.                        - Continue present medications.                        - Await pathology results.                        -  Repeat colonoscopy at the next available appointment                         because the bowel preparation was poor.                        - Return to referring physician as previously                         scheduled. Procedure Code(s):     --- Professional ---                        [phone], 76, Colonoscopy, flexible; with biopsy, single                         or multiple Diagnosis Code(s):     --- Professional ---                        Z12.11, Encounter for screening for malignant neoplasm                         of colon                        K63.5, Polyp of colon CPT copyright 2019 American Medical Association. All rights reserved. The codes documented in this report are preliminary and upon coder review may  be revised to meet current compliance requirements. Andrey Farmer, MD Andrey Farmer MD, MD 06/30/2020 12:00:26 PM Number of Addenda: 0 Note  Initiated On: 06/30/2020 11:17 AM Scope Withdrawal Time: 0 hours 2 minutes 0 seconds  Total Procedure Duration: 0 hours 8 minutes 57 seconds  Estimated Blood Loss:  Estimated blood loss was minimal.      [facility]

## 2020-06-30 NOTE — Anesthesia Preprocedure Evaluation (Signed)
Anesthesia Evaluation  Patient identified by MRN, date of birth, ID band Patient awake    Reviewed: Allergy & Precautions, NPO status , Patient's Chart, lab work & pertinent test results, reviewed documented beta blocker date and time   History of Anesthesia Complications (+) PROLONGED EMERGENCE and history of anesthetic complications  Airway Mallampati: I  TM Distance: >3 FB Neck ROM: limited    Dental  (+) Chipped, Missing, Dental Advidsory Given   Pulmonary neg shortness of breath, neg sleep apnea, neg COPD, neg recent URI, Current Smoker and Patient abstained from smoking.,    Pulmonary exam normal        Cardiovascular Exercise Tolerance: Good hypertension, (-) angina(-) CAD, (-) Past MI, (-) Cardiac Stents and (-) CABG Normal cardiovascular exam(-) dysrhythmias (-) Valvular Problems/Murmurs     Neuro/Psych Seizures -,  fibromyalgia Neuromuscular disease (fibromyalgia)    GI/Hepatic Neg liver ROS, GERD  ,  Endo/Other  diabetes, Insulin Dependent  Renal/GU negative Renal ROS  negative genitourinary   Musculoskeletal   Abdominal   Peds  Hematology negative hematology ROS (+)   Anesthesia Other Findings Past Medical History: No date: Complication of anesthesia     Comment:  STATES WAS TOLD NOT TO TAKE UNKNOWN ANESTHESIA AFTER               NECKFUSION OR HYSTERECTOMY No date: Diabetes mellitus without complication (HCC) No date: Fibromyalgia No date: GERD (gastroesophageal reflux disease) No date: Glaucoma (increased eye pressure)     Comment:  History of Glaucoma but no drops No date: Headache No date: Hyperlipidemia No date: Hypertension No date: Neurofibromatosis (HCC)   Reproductive/Obstetrics negative OB ROS                             Anesthesia Physical  Anesthesia Plan  ASA: III  Anesthesia Plan: General   Post-op Pain Management:    Induction: Intravenous  PONV  Risk Score and Plan: 2 and Propofol infusion and TIVA  Airway Management Planned: Natural Airway and Nasal Cannula  Additional Equipment:   Intra-op Plan:   Post-operative Plan:   Informed Consent: I have reviewed the patients History and Physical, chart, labs and discussed the procedure including the risks, benefits and alternatives for the proposed anesthesia with the patient or authorized representative who has indicated his/her understanding and acceptance.       Plan Discussed with: CRNA  Anesthesia Plan Comments:         Anesthesia Quick Evaluation

## 2020-06-30 NOTE — Interval H&P Note (Signed)
History and Physical Interval Note:  06/30/2020 11:24 AM  Laura Camacho  has presented today for surgery, with the diagnosis of GERD with esophagitis, colon cancer screeing.  The various methods of treatment have been discussed with the patient and family. After consideration of risks, benefits and other options for treatment, the patient has consented to  Procedure(s): ESOPHAGOGASTRODUODENOSCOPY (EGD) WITH PROPOFOL (N/A) COLONOSCOPY WITH PROPOFOL (N/A) as a surgical intervention.  The patient's history has been reviewed, patient examined, no change in status, stable for surgery.  I have reviewed the patient's chart and labs.  Questions were answered to the patient's satisfaction.     Lesly Rubenstein  Ok to proceed with EGD/Colonoscopy

## 2020-06-30 NOTE — Anesthesia Procedure Notes (Signed)
Date/Time: 06/30/2020 11:45 AM Performed by: Allean Found, CRNA Pre-anesthesia Checklist: Patient identified, Emergency Drugs available, Suction available, Patient being monitored and Timeout performed Patient Re-evaluated:Patient Re-evaluated prior to induction Oxygen Delivery Method: Nasal cannula Placement Confirmation: positive ETCO2

## 2020-06-30 NOTE — Progress Notes (Signed)
Pt in recovery with this nurse. Rechecked [id number]. Pt asymptomatic stated that her medication was in the car. Anesthesia made aware. No new orders received. Pt with no noted distress. Pt denies pain or discomfort. Pt to take medication when she gets home. Will continue to monitor until dc.

## 2020-06-30 NOTE — Op Note (Signed)
[facility] Gastroenterology Patient Name: [place] Procedure Date: 06/30/2020 11:19 AM MRN: [id number] Account #: [id number] Date of Birth: 1961-09-02 Admit Type: Outpatient Age: 59 Room: [facility] ENDO ROOM 3 Gender: Female Note Status: Finalized Procedure:             Upper GI endoscopy Indications:           Gastro-esophageal reflux disease Providers:             Andrey Farmer MD, MD Referring MD:          Perrin Maltese, MD (Referring MD) Medicines:             Monitored Anesthesia Care Complications:         No immediate complications. Estimated blood loss:                         Minimal. Procedure:             Pre-Anesthesia Assessment:                        - Prior to the procedure, a History and Physical was                         performed, and patient medications and allergies were                         reviewed. The patient is competent. The risks and                         benefits of the procedure and the sedation options and                         risks were discussed with the patient. All questions                         were answered and informed consent was obtained.                         Patient identification and proposed procedure were                         verified by the physician, the nurse, the anesthetist                         and the technician in the endoscopy suite. Mental                         Status Examination: alert and oriented. Airway                         Examination: normal oropharyngeal airway and neck                         mobility. Respiratory Examination: clear to                         auscultation. CV Examination: normal. Prophylactic  Antibiotics: The patient does not require prophylactic                         antibiotics. Prior Anticoagulants: The patient has                         taken no previous anticoagulant or antiplatelet                         agents. ASA  Grade Assessment: III - A patient with                         severe systemic disease. After reviewing the risks and                         benefits, the patient was deemed in satisfactory                         condition to undergo the procedure. The anesthesia                         plan was to use monitored anesthesia care (MAC).                         Immediately prior to administration of medications,                         the patient was re-assessed for adequacy to receive                         sedatives. The heart rate, respiratory rate, oxygen                         saturations, blood pressure, adequacy of pulmonary                         ventilation, and response to care were monitored                         throughout the procedure. The physical status of the                         patient was re-assessed after the procedure.                        After obtaining informed consent, the endoscope was                         passed under direct vision. Throughout the procedure,                         the patient's blood pressure, pulse, and oxygen                         saturations were monitored continuously. The Endoscope                         was introduced through the mouth, and advanced to the  second part of duodenum. The upper GI endoscopy was                         accomplished without difficulty. The patient tolerated                         the procedure well. Findings:      A single 7 mm nodule with a localized distribution was found at the       gastroesophageal junction. Biopsies were taken with a cold forceps for       histology. Estimated blood loss was minimal.      A single 8 mm papule (nodule) with no stigmata of recent bleeding was       found at the pylorus. Biopsies were taken with a cold forceps for       histology. Estimated blood loss was minimal.      The examined duodenum was normal.      A 4 cm hiatal hernia was  present.      The exam of the esophagus was otherwise normal. Impression:            - Nodule found in the esophagus. Biopsied.                        - A single papule (nodule) found in the stomach.                         Biopsied.                        - Normal examined duodenum.                        - 4 cm hiatal hernia. Recommendation:        - Discharge patient to home.                        - Resume previous diet.                        - Continue present medications.                        - Await pathology results.                        - Return to referring physician as previously                         scheduled. Procedure Code(s):     --- Professional ---                        [phone], Esophagogastroduodenoscopy, flexible,                         transoral; with biopsy, single or multiple Diagnosis Code(s):     --- Professional ---                        K22.8, Other specified diseases of esophagus  K31.89, Other diseases of stomach and duodenum                        K44.9, Diaphragmatic hernia without obstruction or                         gangrene                        K21.9, Gastro-esophageal reflux disease without                         esophagitis CPT copyright 2019 American Medical Association. All rights reserved. The codes documented in this report are preliminary and upon coder review may  be revised to meet current compliance requirements. Andrey Farmer, MD Andrey Farmer MD, MD 06/30/2020 11:57:10 AM Number of Addenda: 0 Note Initiated On: 06/30/2020 11:19 AM Estimated Blood Loss:  Estimated blood loss was minimal.      [facility]

## 2020-06-30 NOTE — Anesthesia Postprocedure Evaluation (Signed)
Anesthesia Post Note  Patient: [place] Camacho  Procedure(s) Performed: ESOPHAGOGASTRODUODENOSCOPY (EGD) WITH PROPOFOL (N/A ) COLONOSCOPY WITH PROPOFOL (N/A )  Patient location during evaluation: Endoscopy Anesthesia Type: General Level of consciousness: awake and alert Pain management: pain level controlled Vital Signs Assessment: post-procedure vital signs reviewed and stable Respiratory status: spontaneous breathing, nonlabored ventilation, respiratory function stable and patient connected to nasal cannula oxygen Cardiovascular status: blood pressure returned to baseline and stable Postop Assessment: no apparent nausea or vomiting Anesthetic complications: no   No complications documented.   Last Vitals:  Vitals:   06/30/20 1200 06/30/20 1210  BP: 135/70 (!) 159/69  Pulse: 69   Resp:    Temp: (!) 36 C   SpO2: 99%     Last Pain:  Vitals:   06/30/20 1230  TempSrc:   PainSc: 0-No pain                 Martha Clan

## 2020-06-30 NOTE — H&P (Signed)
Outpatient short stay form Pre-procedure 06/30/2020 11:21 AM Raylene Miyamoto MD, MPH  Primary Physician:  None Listed  Reason for visit:  GERD/Screening  History of present illness:   59 y/o lady with GERD and prior history of poor prep here for EGD and screening colon. No blood thinners. History of hysterectomy. Cousin had colon cancer but no immediate family members.    Current Facility-Administered Medications:  .  0.9 %  sodium chloride infusion, , Intravenous, Continuous, Stephine Langbehn, Hilton Cork, MD, Last Rate: 20 mL/hr at 06/30/20 1114, 1,000 mL at 06/30/20 1114  Medications Prior to Admission  Medication Sig Dispense Refill Last Dose  . amLODipine (NORVASC) 5 MG tablet Take 5 mg by mouth daily.   Past Week at Unknown time  . atorvastatin (LIPITOR) 20 MG tablet Take 20 mg by mouth daily.    Past Week at Unknown time  . dapagliflozin propanediol (FARXIGA) 10 MG TABS tablet Take 10 mg by mouth daily.   Past Week at Unknown time  . dexlansoprazole (DEXILANT) 60 MG capsule Take 60 mg by mouth daily.   Past Week at Unknown time  . Docusate Calcium (STOOL SOFTENER PO) Take 1 capsule by mouth daily.   Past Week at Unknown time  . dorzolamide-timolol (COSOPT) 22.3-6.8 MG/ML ophthalmic solution Place 1 drop into both eyes 2 (two) times daily.   Past Week at Unknown time  . ergocalciferol (VITAMIN D2) 1.25 MG (50000 UT) capsule Take 50,000 Units by mouth once a week.   Past Week at Unknown time  . fluticasone (FLONASE) 50 MCG/ACT nasal spray Place 1 spray into both nostrils daily as needed for allergies or rhinitis.   Past Week at Unknown time  . gabapentin (NEURONTIN) 300 MG capsule Take 300-600 mg by mouth at bedtime as needed (pain-pt takes 1-2 capsules at bedtime PRN).    Past Week at Unknown time  . LANTUS SOLOSTAR 100 UNIT/ML Solostar Pen Inject 30 Units into the skin at bedtime.   3 06/29/2020 at Unknown time  . losartan (COZAAR) 100 MG tablet Take 100 mg by mouth daily.   Past Week at  Unknown time  . Nebivolol HCl (BYSTOLIC) 20 MG TABS Take 20 mg by mouth daily.    Past Week at Unknown time  . Suvorexant (BELSOMRA) 10 MG TABS Take 10 mg by mouth at bedtime as needed.   Past Week at Unknown time  . diltiazem (DILACOR XR) 180 MG 24 hr capsule Take 360 mg by mouth at bedtime. (Patient not taking: Reported on 06/29/2020)   Not Taking at Unknown time  . omeprazole (PRILOSEC) 40 MG capsule Take 40 mg by mouth at bedtime.  (Patient not taking: Reported on 06/29/2020)   Not Taking at Unknown time  . phenazopyridine (PYRIDIUM) 100 MG tablet Take 1 tablet (100 mg total) by mouth 3 (three) times daily as needed for pain. (Patient not taking: Reported on 06/29/2020) 20 tablet 0 Not Taking at Unknown time     Allergies  Allergen Reactions  . Codeine Anaphylaxis    "Throat swells up"  . Bupropion     Unknown  . Cefuroxime Rash  . Ciprofloxacin Rash  . Fluconazole Itching  . Ibuprofen Itching  . Iodine Hives  . Ivp Dye [Iodinated Diagnostic Agents] Hives  . Metformin And Related Itching  . Nizoral [Ketoconazole] Other (See Comments)    Dizzy/ Fainting  . Sulfa Antibiotics Hives  . Voltaren [Diclofenac Sodium] Diarrhea and Nausea And Vomiting     Past Medical History:  Diagnosis Date  . Complication of anesthesia    STATES WAS TOLD NOT TO TAKE UNKNOWN ANESTHESIA AFTER NECKFUSION OR HYSTERECTOMY  . Diabetes mellitus without complication ([place])   . Fibromyalgia   . GERD (gastroesophageal reflux disease)   . Glaucoma (increased eye pressure)    History of Glaucoma but no drops  . Headache   . Hyperlipidemia   . Hypertension   . Neurofibromatosis ([place])     Review of systems:  Otherwise negative.    Physical Exam  Gen: Alert, oriented. Appears stated age.  HEENT: [place]/AT. PERRLA. Lungs: No respiratory distress Abd: soft, benign, no masses. Ext: No edema.    Planned procedures: Proceed with EGD/colonoscopy. The patient understands the nature of the planned procedure,  indications, risks, alternatives and potential complications including but not limited to bleeding, infection, perforation, damage to internal organs and possible oversedation/side effects from anesthesia. The patient agrees and gives consent to proceed.  Please refer to procedure notes for findings, recommendations and patient disposition/instructions.     Raylene Miyamoto MD, MPH Gastroenterology 06/30/2020  11:21 AM

## 2020-07-01 ENCOUNTER — Encounter: Payer: Self-pay | Admitting: Gastroenterology

## 2020-07-02 LAB — SURGICAL PATHOLOGY

## 2020-07-23 ENCOUNTER — Other Ambulatory Visit: Payer: Self-pay

## 2020-07-23 ENCOUNTER — Other Ambulatory Visit
Admission: RE | Admit: 2020-07-23 | Discharge: 2020-07-23 | Disposition: A | Discharge status: Home / Self Care | Payer: Medicare HMO | Source: Ambulatory Visit | Attending: Gastroenterology | Admitting: Gastroenterology

## 2020-07-23 DIAGNOSIS — Z20822 Contact with and (suspected) exposure to covid-19: Secondary | ICD-10-CM | POA: Diagnosis not present

## 2020-07-23 DIAGNOSIS — Z01812 Encounter for preprocedural laboratory examination: Secondary | ICD-10-CM | POA: Diagnosis present

## 2020-07-23 LAB — SARS CORONAVIRUS 2 (TAT 6-24 HRS): SARS Coronavirus 2: NEGATIVE

## 2020-07-24 ENCOUNTER — Encounter: Payer: Self-pay | Admitting: *Deleted

## 2020-07-27 ENCOUNTER — Encounter
Admission: RE | Disposition: A | Discharge status: Home / Self Care | Payer: Self-pay | Source: Home / Self Care | Attending: Gastroenterology

## 2020-07-27 ENCOUNTER — Ambulatory Visit: Payer: Medicare HMO | Admitting: Anesthesiology

## 2020-07-27 ENCOUNTER — Other Ambulatory Visit: Payer: Self-pay

## 2020-07-27 ENCOUNTER — Encounter: Payer: Self-pay | Admitting: Anesthesiology

## 2020-07-27 ENCOUNTER — Ambulatory Visit
Admission: RE | Admit: 2020-07-27 | Discharge: 2020-07-27 | Disposition: A | Discharge status: Home / Self Care | Payer: Medicare HMO | Attending: Gastroenterology | Admitting: Gastroenterology

## 2020-07-27 DIAGNOSIS — Z79899 Other long term (current) drug therapy: Secondary | ICD-10-CM | POA: Diagnosis not present

## 2020-07-27 DIAGNOSIS — I10 Essential (primary) hypertension: Secondary | ICD-10-CM | POA: Insufficient documentation

## 2020-07-27 DIAGNOSIS — D124 Benign neoplasm of descending colon: Secondary | ICD-10-CM | POA: Diagnosis not present

## 2020-07-27 DIAGNOSIS — Z794 Long term (current) use of insulin: Secondary | ICD-10-CM | POA: Insufficient documentation

## 2020-07-27 DIAGNOSIS — E119 Type 2 diabetes mellitus without complications: Secondary | ICD-10-CM | POA: Diagnosis not present

## 2020-07-27 DIAGNOSIS — K640 First degree hemorrhoids: Secondary | ICD-10-CM | POA: Insufficient documentation

## 2020-07-27 DIAGNOSIS — M797 Fibromyalgia: Secondary | ICD-10-CM | POA: Diagnosis not present

## 2020-07-27 DIAGNOSIS — H409 Unspecified glaucoma: Secondary | ICD-10-CM | POA: Insufficient documentation

## 2020-07-27 DIAGNOSIS — G4730 Sleep apnea, unspecified: Secondary | ICD-10-CM | POA: Insufficient documentation

## 2020-07-27 DIAGNOSIS — K219 Gastro-esophageal reflux disease without esophagitis: Secondary | ICD-10-CM | POA: Insufficient documentation

## 2020-07-27 DIAGNOSIS — Z1211 Encounter for screening for malignant neoplasm of colon: Secondary | ICD-10-CM | POA: Insufficient documentation

## 2020-07-27 DIAGNOSIS — E785 Hyperlipidemia, unspecified: Secondary | ICD-10-CM | POA: Diagnosis not present

## 2020-07-27 DIAGNOSIS — D122 Benign neoplasm of ascending colon: Secondary | ICD-10-CM | POA: Insufficient documentation

## 2020-07-27 DIAGNOSIS — Z800 Family history of malignant neoplasm of digestive organs: Secondary | ICD-10-CM | POA: Insufficient documentation

## 2020-07-27 DIAGNOSIS — K635 Polyp of colon: Secondary | ICD-10-CM | POA: Insufficient documentation

## 2020-07-27 HISTORY — DX: Tubulo-interstitial nephritis, not specified as acute or chronic: N12

## 2020-07-27 HISTORY — DX: Epilepsy, unspecified, not intractable, without status epilepticus: G40.909

## 2020-07-27 HISTORY — PX: COLONOSCOPY: SHX5424

## 2020-07-27 LAB — GLUCOSE, CAPILLARY: Glucose-Capillary: 254 mg/dL — ABNORMAL HIGH (ref 70–99)

## 2020-07-27 SURGERY — COLONOSCOPY
Anesthesia: General

## 2020-07-27 MED ORDER — PROPOFOL 500 MG/50ML IV EMUL
INTRAVENOUS | Status: AC
  Filled 2020-07-27: qty 50

## 2020-07-27 MED ORDER — PROPOFOL 500 MG/50ML IV EMUL
INTRAVENOUS | Status: DC | PRN
  Administered 2020-07-27: 150 ug/kg/min via INTRAVENOUS

## 2020-07-27 MED ORDER — LIDOCAINE HCL (PF) 1 % IJ SOLN
INTRAMUSCULAR | Status: AC
  Filled 2020-07-27: qty 2

## 2020-07-27 MED ORDER — SODIUM CHLORIDE 0.9 % IV SOLN
INTRAVENOUS | Status: DC
  Administered 2020-07-27: 1000 mL via INTRAVENOUS

## 2020-07-27 MED ORDER — EPHEDRINE SULFATE 50 MG/ML IJ SOLN
INTRAMUSCULAR | Status: DC | PRN
  Administered 2020-07-27: 5 mg via INTRAVENOUS

## 2020-07-27 MED ORDER — PROPOFOL 10 MG/ML IV BOLUS
INTRAVENOUS | Status: AC
  Filled 2020-07-27: qty 20

## 2020-07-27 MED ORDER — EPHEDRINE 5 MG/ML INJ
INTRAVENOUS | Status: AC
  Filled 2020-07-27: qty 4

## 2020-07-27 NOTE — Anesthesia Postprocedure Evaluation (Signed)
Anesthesia Post Note  Patient: [place] Camacho  Procedure(s) Performed: COLONOSCOPY (N/A )  Patient location during evaluation: Endoscopy Anesthesia Type: General Level of consciousness: awake and alert Pain management: pain level controlled Vital Signs Assessment: post-procedure vital signs reviewed and stable Respiratory status: spontaneous breathing, nonlabored ventilation, respiratory function stable and patient connected to nasal cannula oxygen Cardiovascular status: blood pressure returned to baseline and stable Postop Assessment: no apparent nausea or vomiting Anesthetic complications: no   No complications documented.   Last Vitals:  Vitals:   07/27/20 1127 07/27/20 1137  BP: 130/74 133/69  Pulse: 60 (!) 53  Resp: 20 18  Temp:    SpO2: 100% 100%    Last Pain:  Vitals:   07/27/20 1147  TempSrc:   PainSc: 0-No pain                 Arita Miss

## 2020-07-27 NOTE — Op Note (Signed)
[facility] Gastroenterology Patient Name: [place] Procedure Date: 07/27/2020 10:34 AM MRN: [id number] Account #: [id number] Date of Birth: 1960/11/15 Admit Type: Outpatient Age: 59 Room: [facility] ENDO ROOM 1 Gender: Female Note Status: Finalized Procedure:             Colonoscopy Indications:           Screening for colorectal malignant neoplasm Providers:             Andrey Farmer MD, MD Referring MD:          No Local Md, MD (Referring MD) Medicines:             Monitored Anesthesia Care Complications:         No immediate complications. Estimated blood loss:                         Minimal. Procedure:             Pre-Anesthesia Assessment:                        - Prior to the procedure, a History and Physical was                         performed, and patient medications and allergies were                         reviewed. The patient is competent. The risks and                         benefits of the procedure and the sedation options and                         risks were discussed with the patient. All questions                         were answered and informed consent was obtained.                         Patient identification and proposed procedure were                         verified by the physician, the nurse, the anesthetist                         and the technician in the endoscopy suite. Mental                         Status Examination: alert and oriented. Airway                         Examination: normal oropharyngeal airway and neck                         mobility. Respiratory Examination: clear to                         auscultation. CV Examination: normal. Prophylactic  Antibiotics: The patient does not require prophylactic                         antibiotics. Prior Anticoagulants: The patient has                         taken no previous anticoagulant or antiplatelet                         agents. ASA  Grade Assessment: III - A patient with                         severe systemic disease. After reviewing the risks and                         benefits, the patient was deemed in satisfactory                         condition to undergo the procedure. The anesthesia                         plan was to use monitored anesthesia care (MAC).                         Immediately prior to administration of medications,                         the patient was re-assessed for adequacy to receive                         sedatives. The heart rate, respiratory rate, oxygen                         saturations, blood pressure, adequacy of pulmonary                         ventilation, and response to care were monitored                         throughout the procedure. The physical status of the                         patient was re-assessed after the procedure.                        After obtaining informed consent, the colonoscope was                         passed under direct vision. Throughout the procedure,                         the patient's blood pressure, pulse, and oxygen                         saturations were monitored continuously. The                         Colonoscope was introduced through the anus and  advanced to the the cecum, identified by appendiceal                         orifice and ileocecal valve. The colonoscopy was                         technically difficult and complex due to significant                         looping. Successful completion of the procedure was                         aided by applying abdominal pressure. The patient                         tolerated the procedure well. The quality of the bowel                         preparation was adequate to identify polyps. Findings:      The perianal and digital rectal examinations were normal.      A 3 mm polyp was found in the ascending colon. The polyp was sessile.       The polyp was  removed with a cold snare. Resection and retrieval were       complete. Estimated blood loss was minimal.      A 2 mm polyp was found in the descending colon. The polyp was sessile.       The polyp was removed with a cold snare. Resection and retrieval were       complete. Estimated blood loss was minimal.      A 2 mm polyp was found in the sigmoid colon. The polyp was sessile. The       polyp was removed with a cold snare. Resection and retrieval were       complete. Estimated blood loss was minimal.      Non-bleeding internal hemorrhoids were found during retroflexion. The       hemorrhoids were Grade I (internal hemorrhoids that do not prolapse).      The exam was otherwise without abnormality on direct and retroflexion       views. Impression:            - One 3 mm polyp in the ascending colon, removed with                         a cold snare. Resected and retrieved.                        - One 2 mm polyp in the descending colon, removed with                         a cold snare. Resected and retrieved.                        - One 2 mm polyp in the sigmoid colon, removed with a                         cold snare. Resected and retrieved.                        -  Non-bleeding internal hemorrhoids.                        - The examination was otherwise normal on direct and                         retroflexion views. Recommendation:        - Discharge patient to home.                        - Resume previous diet.                        - Continue present medications.                        - Await pathology results.                        - Repeat colonoscopy in 3 years for surveillance.                        - Return to referring physician as previously                         scheduled. Procedure Code(s):     --- Professional ---                        [phone], Colonoscopy, flexible; with removal of                         tumor(s), polyp(s), or other lesion(s) by snare                          technique Diagnosis Code(s):     --- Professional ---                        Z12.11, Encounter for screening for malignant neoplasm                         of colon                        K63.5, Polyp of colon                        K64.0, First degree hemorrhoids CPT copyright 2019 [employer]. All rights reserved. The codes documented in this report are preliminary and upon coder review may  be revised to meet current compliance requirements. Andrey Farmer, MD Andrey Farmer MD, MD 07/27/2020 11:16:53 AM Number of Addenda: 0 Note Initiated On: 07/27/2020 10:34 AM Scope Withdrawal Time: 0 hours 16 minutes 45 seconds  Total Procedure Duration: 0 hours 29 minutes 48 seconds  Estimated Blood Loss:  Estimated blood loss was minimal.      [facility]

## 2020-07-27 NOTE — Anesthesia Preprocedure Evaluation (Signed)
Anesthesia Evaluation  Patient identified by MRN, date of birth, ID band Patient awake    Reviewed: Allergy & Precautions, NPO status , Patient's Chart, lab work & pertinent test results, reviewed documented beta blocker date and time   History of Anesthesia Complications (+) PROLONGED EMERGENCE and history of anesthetic complications  Airway Mallampati: II  TM Distance: >3 FB Neck ROM: limited    Dental  (+) Chipped, Missing   Pulmonary neg shortness of breath, sleep apnea and Continuous Positive Airway Pressure Ventilation , neg COPD, neg recent URI, Current Smoker and Patient abstained from smoking.,    Pulmonary exam normal breath sounds clear to auscultation       Cardiovascular Exercise Tolerance: Good hypertension, (-) angina(-) CAD, (-) Past MI, (-) Cardiac Stents and (-) CABG Normal cardiovascular exam(-) dysrhythmias (-) Valvular Problems/Murmurs Rhythm:Regular Rate:Normal - Systolic murmurs    Neuro/Psych  Headaches, Seizures -,  fibromyalgia Neuromuscular disease (fibromyalgia)    GI/Hepatic Neg liver ROS, GERD  ,  Endo/Other  diabetes, Insulin Dependent  Renal/GU negative Renal ROS  negative genitourinary   Musculoskeletal  (+) Fibromyalgia -  Abdominal   Peds  Hematology negative hematology ROS (+)   Anesthesia Other Findings Past Medical History: No date: Complication of anesthesia     Comment:  STATES WAS TOLD NOT TO TAKE UNKNOWN ANESTHESIA AFTER               NECKFUSION OR HYSTERECTOMY No date: Diabetes mellitus without complication (HCC) No date: Fibromyalgia No date: GERD (gastroesophageal reflux disease) No date: Glaucoma (increased eye pressure)     Comment:  History of Glaucoma but no drops No date: Headache No date: Hyperlipidemia No date: Hypertension No date: Neurofibromatosis (HCC)   Reproductive/Obstetrics negative OB ROS                              Anesthesia Physical  Anesthesia Plan  ASA: III  Anesthesia Plan: General   Post-op Pain Management:    Induction: Intravenous  PONV Risk Score and Plan: 2 and Propofol infusion and TIVA  Airway Management Planned: Natural Airway and Nasal Cannula  Additional Equipment: None  Intra-op Plan:   Post-operative Plan:   Informed Consent: I have reviewed the patients History and Physical, chart, labs and discussed the procedure including the risks, benefits and alternatives for the proposed anesthesia with the patient or authorized representative who has indicated his/her understanding and acceptance.     Dental advisory given  Plan Discussed with: CRNA  Anesthesia Plan Comments: (Discussed risks of anesthesia with patient, including possibility of difficulty with spontaneous ventilation under anesthesia necessitating airway intervention, PONV, and rare risks such as cardiac or respiratory or neurological events. Patient understands. Patient counseled on benefits of smoking cessation, and increased perioperative risks associated with continued smoking. )        Anesthesia Quick Evaluation

## 2020-07-27 NOTE — Anesthesia Procedure Notes (Signed)
Performed by: Vaughan Sine Pre-anesthesia Checklist: Patient identified, Emergency Drugs available, Suction available, Patient being monitored and Timeout performed Patient Re-evaluated:Patient Re-evaluated prior to induction Oxygen Delivery Method: Supernova nasal CPAP Preoxygenation: Pre-oxygenation with 100% oxygen Induction Type: IV induction Placement Confirmation: CO2 detector and positive ETCO2

## 2020-07-27 NOTE — Transfer of Care (Signed)
Immediate Anesthesia Transfer of Care Note  Patient: Laura Camacho  Procedure(s) Performed: COLONOSCOPY (N/A )  Patient Location: PACU  Anesthesia Type:General  Level of Consciousness: awake and sedated  Airway & Oxygen Therapy: Patient Spontanous Breathing and Patient connected to face mask oxygen  Post-op Assessment: Report given to RN and Post -op Vital signs reviewed and stable  Post vital signs: Reviewed and stable  Last Vitals:  Vitals Value Taken Time  BP    Temp    Pulse    Resp    SpO2      Last Pain:  Vitals:   07/27/20 1006  TempSrc: Temporal  PainSc: 0-No pain         Complications: No complications documented.

## 2020-07-27 NOTE — H&P (Signed)
Outpatient short stay form Pre-procedure 07/27/2020 10:27 AM Laura Miyamoto MD, MPH  Primary Physician: [place]  Reason for visit:  Screening  History of present illness:   59 y/o lady with hypertension, DM, and HLD here for screening colon. Fair prep in 2017. Had previously poor prep so here for second colonoscopy. States colon cancer in 1st cousin at age of 60. Has history of hysterectomy and an unknown small bowel surgery several years ago. No blood thinners.    Current Facility-Administered Medications:  .  0.9 %  sodium chloride infusion, , Intravenous, Continuous, Kael Keetch, Lysbeth Galas T, MD .  lidocaine (PF) (XYLOCAINE) 1 % injection, , , ,   Medications Prior to Admission  Medication Sig Dispense Refill Last Dose  . atorvastatin (LIPITOR) 80 MG tablet Take 80 mg by mouth daily.     Marland Kitchen amLODipine (NORVASC) 5 MG tablet Take 5 mg by mouth daily.     Marland Kitchen atorvastatin (LIPITOR) 20 MG tablet Take 20 mg by mouth daily.      . dapagliflozin propanediol (FARXIGA) 10 MG TABS tablet Take 10 mg by mouth daily.     Marland Kitchen dexlansoprazole (DEXILANT) 60 MG capsule Take 60 mg by mouth daily.     Marland Kitchen diltiazem (DILACOR XR) 180 MG 24 hr capsule Take 360 mg by mouth at bedtime. (Patient not taking: Reported on 06/29/2020)     . Docusate Calcium (STOOL SOFTENER PO) Take 1 capsule by mouth daily.     . dorzolamide-timolol (COSOPT) 22.3-6.8 MG/ML ophthalmic solution Place 1 drop into both eyes 2 (two) times daily.     . ergocalciferol (VITAMIN D2) 1.25 MG (50000 UT) capsule Take 50,000 Units by mouth once a week.     . fluticasone (FLONASE) 50 MCG/ACT nasal spray Place 1 spray into both nostrils daily as needed for allergies or rhinitis.     Marland Kitchen gabapentin (NEURONTIN) 300 MG capsule Take 300-600 mg by mouth at bedtime as needed (pain-pt takes 1-2 capsules at bedtime PRN).      Marland Kitchen LANTUS SOLOSTAR 100 UNIT/ML Solostar Pen Inject 30 Units into the skin at bedtime.   3   . losartan (COZAAR) 100 MG tablet Take  100 mg by mouth daily.     . Nebivolol HCl (BYSTOLIC) 20 MG TABS Take 20 mg by mouth daily.      Marland Kitchen omeprazole (PRILOSEC) 40 MG capsule Take 40 mg by mouth at bedtime.  (Patient not taking: Reported on 06/29/2020)     . phenazopyridine (PYRIDIUM) 100 MG tablet Take 1 tablet (100 mg total) by mouth 3 (three) times daily as needed for pain. (Patient not taking: Reported on 06/29/2020) 20 tablet 0   . Suvorexant (BELSOMRA) 10 MG TABS Take 10 mg by mouth at bedtime as needed.        Allergies  Allergen Reactions  . Codeine Anaphylaxis    "Throat swells up"  . Bupropion     Unknown  . Cefuroxime Rash  . Ciprofloxacin Rash  . Fluconazole Itching  . Ibuprofen Itching  . Iodine Hives  . Ivp Dye [Iodinated Diagnostic Agents] Hives  . Metformin And Related Itching  . Nizoral [Ketoconazole] Other (See Comments)    Dizzy/ Fainting  . Sulfa Antibiotics Hives  . Voltaren [Diclofenac Sodium] Diarrhea and Nausea And Vomiting     Past Medical History:  Diagnosis Date  . Complication of anesthesia    STATES WAS TOLD NOT TO TAKE UNKNOWN ANESTHESIA AFTER NECKFUSION OR HYSTERECTOMY  . Diabetes mellitus without complication ([place])   .  Epilepsy ([place])   . Fibromyalgia   . GERD (gastroesophageal reflux disease)   . Glaucoma (increased eye pressure)    History of Glaucoma but no drops  . Headache   . Hyperlipidemia   . Hypertension   . Neurofibromatosis ([place])   . Pyelonephritis     Review of systems:  Otherwise negative.    Physical Exam  Gen: Alert, oriented. Appears stated age.  HEENT: [place]/AT. PERRLA. Lungs: No respiratory distress Abd: soft, benign, no masses. BS+ Ext: No edema.     Planned procedures: Proceed with colonoscopy. The patient understands the nature of the planned procedure, indications, risks, alternatives and potential complications including but not limited to bleeding, infection, perforation, damage to internal organs and possible oversedation/side effects from  anesthesia. The patient agrees and gives consent to proceed.  Please refer to procedure notes for findings, recommendations and patient disposition/instructions.     Laura Miyamoto MD, MPH Gastroenterology 07/27/2020  10:27 AM

## 2020-07-27 NOTE — Interval H&P Note (Signed)
History and Physical Interval Note:  07/27/2020 10:29 AM  Laura Camacho  has presented today for surgery, with the diagnosis of COLON CANCER SCREENING.  The various methods of treatment have been discussed with the patient and family. After consideration of risks, benefits and other options for treatment, the patient has consented to  Procedure(s): COLONOSCOPY (N/A) as a surgical intervention.  The patient's history has been reviewed, patient examined, no change in status, stable for surgery.  I have reviewed the patient's chart and labs.  Questions were answered to the patient's satisfaction.     Lesly Rubenstein  Ok to proceed with colonoscopy

## 2020-07-28 LAB — SURGICAL PATHOLOGY

## 2020-08-17 DIAGNOSIS — E1165 Type 2 diabetes mellitus with hyperglycemia: Secondary | ICD-10-CM | POA: Diagnosis not present

## 2020-08-17 DIAGNOSIS — E782 Mixed hyperlipidemia: Secondary | ICD-10-CM | POA: Diagnosis not present

## 2020-08-17 DIAGNOSIS — E669 Obesity, unspecified: Secondary | ICD-10-CM | POA: Diagnosis not present

## 2020-08-17 DIAGNOSIS — Z8639 Personal history of other endocrine, nutritional and metabolic disease: Secondary | ICD-10-CM | POA: Diagnosis not present

## 2020-08-17 DIAGNOSIS — I10 Essential (primary) hypertension: Secondary | ICD-10-CM | POA: Diagnosis not present

## 2020-08-17 DIAGNOSIS — E538 Deficiency of other specified B group vitamins: Secondary | ICD-10-CM | POA: Diagnosis not present

## 2020-08-17 DIAGNOSIS — R69 Illness, unspecified: Secondary | ICD-10-CM | POA: Diagnosis not present

## 2020-09-08 DIAGNOSIS — G4733 Obstructive sleep apnea (adult) (pediatric): Secondary | ICD-10-CM | POA: Diagnosis not present

## 2020-09-08 DIAGNOSIS — R5383 Other fatigue: Secondary | ICD-10-CM | POA: Diagnosis not present

## 2020-09-08 DIAGNOSIS — E785 Hyperlipidemia, unspecified: Secondary | ICD-10-CM | POA: Diagnosis not present

## 2020-09-08 DIAGNOSIS — E1165 Type 2 diabetes mellitus with hyperglycemia: Secondary | ICD-10-CM | POA: Diagnosis not present

## 2020-09-08 DIAGNOSIS — E538 Deficiency of other specified B group vitamins: Secondary | ICD-10-CM | POA: Diagnosis not present

## 2020-09-08 DIAGNOSIS — E559 Vitamin D deficiency, unspecified: Secondary | ICD-10-CM | POA: Diagnosis not present

## 2020-09-08 DIAGNOSIS — I10 Essential (primary) hypertension: Secondary | ICD-10-CM | POA: Diagnosis not present

## 2020-09-16 DIAGNOSIS — I10 Essential (primary) hypertension: Secondary | ICD-10-CM | POA: Diagnosis not present

## 2020-09-16 DIAGNOSIS — G4733 Obstructive sleep apnea (adult) (pediatric): Secondary | ICD-10-CM | POA: Diagnosis not present

## 2020-09-16 DIAGNOSIS — E119 Type 2 diabetes mellitus without complications: Secondary | ICD-10-CM | POA: Diagnosis not present

## 2020-09-16 DIAGNOSIS — G4700 Insomnia, unspecified: Secondary | ICD-10-CM | POA: Diagnosis not present

## 2020-10-22 ENCOUNTER — Other Ambulatory Visit: Payer: Self-pay | Admitting: Nephrology

## 2020-10-22 DIAGNOSIS — R809 Proteinuria, unspecified: Secondary | ICD-10-CM

## 2020-10-22 DIAGNOSIS — N1831 Chronic kidney disease, stage 3a: Secondary | ICD-10-CM | POA: Diagnosis not present

## 2020-10-22 DIAGNOSIS — D631 Anemia in chronic kidney disease: Secondary | ICD-10-CM | POA: Diagnosis not present

## 2020-10-22 DIAGNOSIS — E1122 Type 2 diabetes mellitus with diabetic chronic kidney disease: Secondary | ICD-10-CM | POA: Diagnosis not present

## 2020-10-22 DIAGNOSIS — I10 Essential (primary) hypertension: Secondary | ICD-10-CM | POA: Diagnosis not present

## 2020-10-22 DIAGNOSIS — R82998 Other abnormal findings in urine: Secondary | ICD-10-CM | POA: Diagnosis not present

## 2020-11-09 ENCOUNTER — Ambulatory Visit: Admission: RE | Admit: 2020-11-09 | Payer: Medicare HMO | Source: Ambulatory Visit

## 2020-11-26 ENCOUNTER — Ambulatory Visit
Admission: RE | Admit: 2020-11-26 | Discharge: 2020-11-26 | Disposition: A | Discharge status: Home / Self Care | Payer: Medicare HMO | Source: Ambulatory Visit | Attending: Nephrology | Admitting: Nephrology

## 2020-11-26 ENCOUNTER — Other Ambulatory Visit: Payer: Self-pay

## 2020-11-26 DIAGNOSIS — R809 Proteinuria, unspecified: Secondary | ICD-10-CM | POA: Insufficient documentation

## 2020-11-26 DIAGNOSIS — N1831 Chronic kidney disease, stage 3a: Secondary | ICD-10-CM | POA: Diagnosis present

## 2020-12-14 ENCOUNTER — Ambulatory Visit: Payer: Self-pay

## 2020-12-14 NOTE — Telephone Encounter (Signed)
Granddaughter asking if Vit D,C and Zinc are "ok for her to take." WITH COVID 19. Instructed her that was fine. Instructed to reach out to PCP as needed. Verbalizes understanding.

## 2021-04-14 DIAGNOSIS — R5383 Other fatigue: Secondary | ICD-10-CM | POA: Diagnosis not present

## 2021-04-14 DIAGNOSIS — E1165 Type 2 diabetes mellitus with hyperglycemia: Secondary | ICD-10-CM | POA: Diagnosis not present

## 2021-04-14 DIAGNOSIS — K219 Gastro-esophageal reflux disease without esophagitis: Secondary | ICD-10-CM | POA: Diagnosis not present

## 2021-04-14 DIAGNOSIS — I10 Essential (primary) hypertension: Secondary | ICD-10-CM | POA: Diagnosis not present

## 2021-04-14 DIAGNOSIS — G4733 Obstructive sleep apnea (adult) (pediatric): Secondary | ICD-10-CM | POA: Diagnosis not present

## 2021-04-14 DIAGNOSIS — E785 Hyperlipidemia, unspecified: Secondary | ICD-10-CM | POA: Diagnosis not present

## 2021-04-27 ENCOUNTER — Institutional Professional Consult (permissible substitution): Payer: Medicare HMO | Admitting: Internal Medicine

## 2021-04-28 DIAGNOSIS — G4733 Obstructive sleep apnea (adult) (pediatric): Secondary | ICD-10-CM | POA: Diagnosis not present

## 2021-05-31 ENCOUNTER — Encounter: Payer: Self-pay | Admitting: Neurology

## 2021-05-31 ENCOUNTER — Ambulatory Visit (INDEPENDENT_AMBULATORY_CARE_PROVIDER_SITE_OTHER): Payer: Medicare HMO | Admitting: Neurology

## 2021-05-31 VITALS — BP 143/74 | HR 84 | Ht 61.0 in | Wt 185.0 lb

## 2021-05-31 DIAGNOSIS — R52 Pain, unspecified: Secondary | ICD-10-CM | POA: Insufficient documentation

## 2021-05-31 DIAGNOSIS — Q8500 Neurofibromatosis, unspecified: Secondary | ICD-10-CM | POA: Diagnosis not present

## 2021-05-31 DIAGNOSIS — Z113 Encounter for screening for infections with a predominantly sexual mode of transmission: Secondary | ICD-10-CM | POA: Diagnosis not present

## 2021-05-31 MED ORDER — DULOXETINE HCL 30 MG PO CPEP: 30.0000 mg | ORAL_CAPSULE | Freq: Every day | ORAL | 6 refills | Status: DC

## 2021-05-31 NOTE — Progress Notes (Signed)
Chief Complaint  Patient presents with   New Patient (Initial Visit)    Rm 13, with granddaughter  NP/Paper/Laura Boswell NP [facility]. Pt here for Second opinion for neurofibromatosis Today c/o of neck pain where rod is located,  lack of balance, difficulty walking       ASSESSMENT AND PLAN  Laura Camacho is a 60 y.o. female   Diffuse body achy pain, gait abnormality  Significantly elevated ESR 100 in April 2022, suggestive of polymyalgia rheumatica,  Repeat ESR C-reactive protein, if remains elevated, will start a prednisone treatment,  Cymbalta today History of cervical decompression C3-4, 4 5, 5 6 Neurofibromatosis type I  Only mild involvement, strong family history,  DIAGNOSTIC DATA (LABS, IMAGING, TESTING) - I reviewed patient records, labs, notes, testing and imaging myself where available.  MRI cervical at [facility] in August 2021: 1. Cervical spondylosis and degenerative disc disease, causing mild  to moderate impingement at C6-7 and mild impingement at C2-3, C3-4,  and C5-6.  2. Interbody fusion at C3-4, C4-5, and C5-6.   MRI lumbar in August 2021. 1. Lumbar spondylosis and degenerative disc disease, causing mild impingement at the L4-5 and L5-S1 levels, slightly worsened from prior. 2. Minimal dextroconvex lumbar scoliosis with rotary component.      MRI cervical in August 2021 [facility] Image 1. Cervical spondylosis and degenerative disc disease, causing mild to moderate impingement at C6-7 and mild impingement at C2-3, C3-4, and C5-6. 2. Interbody fusion at C3-4, C4-5, and C5-6. 3. Despite efforts by the technologist and patient, motion artifact is present on today's exam and could not be eliminated. This reduces exam sensitivity and specificity.  Laboratory evaluations in April 2022, triglyceride was 278, LDL was 77, CMP showed creatinine mildly elevated 1.2, glucose 244, C-reactive protein was 19, ESR was 100, liver functional test  showed decreased albumin of 2.8, ALT of 12,       MEDICAL HISTORY:  Laura, is a 60 year old female, seen in request by primary care physician from [place], for evaluation of increased difficulty walking, diffuse body achy pain, she is accompanied by her granddaughter Laura Camacho at today's visit on June 02, 2021    I reviewed and summarized the referring note. PMHX. HTN HLD DM since 2010 GERD. Cervical fusion, [place] more than 10 years ago.  She reported a strong family history of neurofibromatosis type I, also called von Recklinghausen disease, her mother, has severe disease, had multiple brain surgery, died at her young age, she is #3 of 5 children, 2 older brothers suffered significant disease, 1 younger brother suffered what sounds like malignant neurofibromatosis, the other surviving younger brothers show no sign of it, she had a big right arm neurofibroma growth about 20 years ago, had surgery at [facility], but also surgically, she was looking after her grandchildren, has to hold on piece of falling window glass, which has  re-injured her right arm, she has some residual right arm paresthesia involving her right hand, intermittent right arm weakness since then, she has to really focusing when she tried to use her arm,  She also had cervical decompression fusion at [facility] in 2011, for neck pain, radiating pain to arms, surgery initially was helpful,  But over past decade, she often complains of intermittent body achy pain, involving bilateral upper and lower extremities, neck, low back, especially since beginning of 2022, she now complains of diffuse body achy pain, low back pain, neck pain, bilateral arm paresthesia, right worse than left, referred  to physical therapy, all with limited help,  She has 1 daughter, and grandchildrens, none of them showed signs of neurofibromatosis  Laboratory evaluation in February 2022 showed significantly elevated ESR, C-reactive  protein, negative ANA, this happened in the setting of urinary tract infection   PHYSICAL EXAM:   Vitals:   05/31/21 1011  Weight: 185 lb (83.9 kg)  Height: '5\' 1"'  (1.549 m)   Not recorded     Body mass index is 34.96 kg/m.  PHYSICAL EXAMNIATION:  Gen: NAD, conversant, well nourised, well groomed                     Cardiovascular: Regular rate rhythm, no peripheral edema, warm, nontender. Eyes: Conjunctivae clear without exudates or hemorrhage Neck: Supple, no carotid bruits. Pulmonary: Clear to auscultation bilaterally   NEUROLOGICAL EXAM:  MENTAL STATUS: Speech:    Speech is normal; fluent and spontaneous with normal comprehension.  Cognition:     Orientation to time, place and person     Normal recent and remote memory     Normal Attention span and concentration     Normal Language, naming, repeating,spontaneous speech     Fund of knowledge   CRANIAL NERVES: CN II: Visual fields are full to confrontation. Pupils are round equal and briskly reactive to light. CN III, IV, VI: extraocular movement are normal. No ptosis. CN V: Facial sensation is intact to light touch CN VII: Face is symmetric with normal eye closure  CN VIII: Hearing is normal to causal conversation. CN IX, X: Phonation is normal. CN XI: Head turning and shoulder shrug are intact  MOTOR: Complains of tenderness on deep palpitation of the muscles, no significant bilateral upper and lower extremity proximal and distal muscle weakness,  REFLEXES: Reflexes are 2+ and symmetric at the biceps, triceps, knees, and ankles. Plantar responses are flexor.  SENSORY: Intact to light touch, pinprick and vibratory sensation are intact in fingers and toes.  COORDINATION: There is no trunk or limb dysmetria noted.  GAIT/STANCE: Need push-up to get up from seated position, mildly antalgic, wide-based, cautious, but steady  REVIEW OF SYSTEMS:  Full 14 system review of systems performed and notable only for as  above All other review of systems were negative.   ALLERGIES: Allergies  Allergen Reactions   Codeine Anaphylaxis    "Throat swells up"   Bupropion     Unknown   Cefuroxime Rash   Ciprofloxacin Rash   Fluconazole Itching   Ibuprofen Itching   Iodine Hives   Ivp Dye [Iodinated Diagnostic Agents] Hives   Metformin And Related Itching   Nizoral [Ketoconazole] Other (See Comments)    Dizzy/ Fainting   Sulfa Antibiotics Hives   Voltaren [Diclofenac Sodium] Diarrhea and Nausea And Vomiting    HOME MEDICATIONS: Current Outpatient Medications  Medication Sig Dispense Refill   amLODipine (NORVASC) 5 MG tablet Take 5 mg by mouth daily.     atorvastatin (LIPITOR) 80 MG tablet Take 80 mg by mouth daily.     chlorthalidone (HYGROTON) 25 MG tablet Take by mouth.     dapagliflozin propanediol (FARXIGA) 10 MG TABS tablet Take 10 mg by mouth daily.     dexlansoprazole (DEXILANT) 60 MG capsule Take 60 mg by mouth daily.     diltiazem (DILACOR XR) 180 MG 24 hr capsule Take 360 mg by mouth at bedtime.     Docusate Calcium (STOOL SOFTENER PO) Take 1 capsule by mouth daily.     dorzolamide-timolol (COSOPT) 22.3-6.8 MG/ML  ophthalmic solution Place 1 drop into both eyes 2 (two) times daily.     ergocalciferol (VITAMIN D2) 1.25 MG (50000 UT) capsule Take 50,000 Units by mouth once a week.     fluticasone (FLONASE) 50 MCG/ACT nasal spray Place 1 spray into both nostrils daily as needed for allergies or rhinitis.     gabapentin (NEURONTIN) 300 MG capsule Take 300-600 mg by mouth at bedtime as needed (pain-pt takes 1-2 capsules at bedtime PRN).      LANTUS SOLOSTAR 100 UNIT/ML Solostar Pen Inject 30 Units into the skin at bedtime.   3   losartan (COZAAR) 100 MG tablet Take 100 mg by mouth daily.     Nebivolol HCl 20 MG TABS Take 20 mg by mouth daily.      omeprazole (PRILOSEC) 40 MG capsule Take 40 mg by mouth at bedtime.     phenazopyridine (PYRIDIUM) 100 MG tablet Take 1 tablet (100 mg total) by  mouth 3 (three) times daily as needed for pain. 20 tablet 0   Suvorexant (BELSOMRA) 10 MG TABS Take 10 mg by mouth at bedtime as needed.     traZODone (DESYREL) 50 MG tablet Take 50 mg by mouth at bedtime.     No current facility-administered medications for this visit.    PAST MEDICAL HISTORY: Past Medical History:  Diagnosis Date   Complication of anesthesia    STATES WAS TOLD NOT TO TAKE UNKNOWN ANESTHESIA AFTER NECKFUSION OR HYSTERECTOMY   Diabetes mellitus without complication (HCC)    Epilepsy ([place])    Fibromyalgia    GERD (gastroesophageal reflux disease)    Glaucoma (increased eye pressure)    History of Glaucoma but no drops   Headache    Hyperlipidemia    Hypertension    Neurofibromatosis ([place])    Pyelonephritis     PAST SURGICAL HISTORY: Past Surgical History:  Procedure Laterality Date   ABDOMINAL HYSTERECTOMY     has both ovaries- [place]   Bilateral Arm Surgery Bilateral    'tumors' removed   BREAST BIOPSY Left 11/05/2018   [place] core venus clip  BIOPSY: GRANULAR CELL TUMOR, 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   BREAST BIOPSY Left 11/05/2018   [place] core axilla hydro marker  NEGATIVE FOR METASTATIC CARCINOMA   CERVICAL FUSION     COLON SURGERY     COLONOSCOPY N/A 07/27/2020   Procedure: COLONOSCOPY;  Surgeon: Lesly Rubenstein, MD;  Location: [facility] ENDOSCOPY;  Service: Endoscopy;  Laterality: N/A;   COLONOSCOPY WITH PROPOFOL N/A 02/12/2016   Procedure: COLONOSCOPY WITH PROPOFOL;  Surgeon: Josefine Class, MD;  Location: [facility] ENDOSCOPY;  Service: Endoscopy;  Laterality: N/A;   COLONOSCOPY WITH PROPOFOL N/A 06/30/2020   Procedure: COLONOSCOPY WITH PROPOFOL;  Surgeon: Lesly Rubenstein, MD;  Location: [facility] ENDOSCOPY;  Service: Endoscopy;  Laterality: N/A;   ESOPHAGOGASTRODUODENOSCOPY (EGD) WITH PROPOFOL N/A 02/12/2016   Procedure: ESOPHAGOGASTRODUODENOSCOPY (EGD) WITH PROPOFOL;  Surgeon: Josefine Class, MD;  Location: [facility] ENDOSCOPY;  Service: Endoscopy;  Laterality:  N/A;   ESOPHAGOGASTRODUODENOSCOPY (EGD) WITH PROPOFOL N/A 06/30/2020   Procedure: ESOPHAGOGASTRODUODENOSCOPY (EGD) WITH PROPOFOL;  Surgeon: Lesly Rubenstein, MD;  Location: [facility] ENDOSCOPY;  Service: Endoscopy;  Laterality: N/A;   VULVECTOMY N/A 06/26/2017   Procedure: WIDE EXCISION VULVECTOMY;  Surgeon: Brayton Mars, MD;  Location: [facility] ORS;  Service: Gynecology;  Laterality: N/A;    FAMILY HISTORY: Family History  Problem Relation Age of Onset   Diabetes Mother    Diabetes Father    Diabetes Maternal Aunt  Diabetes Maternal Uncle    Breast cancer Cousin    Ovarian cancer Neg Hx    Colon cancer Neg Hx     SOCIAL HISTORY: Social History   Socioeconomic History   Marital status: Single    Spouse name: Not on file   Number of children: Not on file   Years of education: Not on file   Highest education level: Not on file  Occupational History   Not on file  Tobacco Use   Smoking status: Every Day    Packs/day: 0.50    Types: Cigarettes   Smokeless tobacco: Never  Vaping Use   Vaping Use: Never used  Substance and Sexual Activity   Alcohol use: Yes    Alcohol/week: 3.0 - 4.0 standard drinks    Types: 3 - 4 Shots of liquor per week    Comment: once a week   Drug use: Not Currently    Types: Marijuana    Comment: monthly   Sexual activity: Yes    Birth control/protection: Surgical  Other Topics Concern   Not on file  Social History Narrative   Not on file   Social Determinants of Health   Financial Resource Strain: Not on file  Food Insecurity: Not on file  Transportation Needs: Not on file  Physical Activity: Not on file  Stress: Not on file  Social Connections: Not on file  Intimate Partner Violence: Not on file    Total time spent reviewing the chart, obtaining history, examined patient, ordering tests, documentation, consultations and family, care coordination was 65 minutes .   Marcial Pacas, M.D. Ph.D.  [facility] [address], [place], [place] [address] Ph: [phone] Fax: [phone]  CC:  [place],  [place] [address]  [facility]

## 2021-06-01 DIAGNOSIS — G4733 Obstructive sleep apnea (adult) (pediatric): Secondary | ICD-10-CM | POA: Diagnosis not present

## 2021-06-01 DIAGNOSIS — F32A Depression, unspecified: Secondary | ICD-10-CM | POA: Diagnosis not present

## 2021-06-01 DIAGNOSIS — E1165 Type 2 diabetes mellitus with hyperglycemia: Secondary | ICD-10-CM | POA: Diagnosis not present

## 2021-06-01 DIAGNOSIS — G4700 Insomnia, unspecified: Secondary | ICD-10-CM | POA: Diagnosis not present

## 2021-06-02 ENCOUNTER — Telehealth: Payer: Self-pay | Admitting: Neurology

## 2021-06-02 DIAGNOSIS — R269 Unspecified abnormalities of gait and mobility: Secondary | ICD-10-CM

## 2021-06-02 DIAGNOSIS — R2689 Other abnormalities of gait and mobility: Secondary | ICD-10-CM

## 2021-06-02 LAB — COMPREHENSIVE METABOLIC PANEL
ALT: 10 IU/L (ref 0–32)
AST: 10 IU/L (ref 0–40)
Albumin/Globulin Ratio: 1.3 (ref 1.2–2.2)
Albumin: 4.2 g/dL (ref 3.8–4.8)
Alkaline Phosphatase: 78 IU/L (ref 44–121)
BUN/Creatinine Ratio: 8 — ABNORMAL LOW (ref 12–28)
BUN: 10 mg/dL (ref 8–27)
Bilirubin Total: 0.3 mg/dL (ref 0.0–1.2)
CO2: 26 mmol/L (ref 20–29)
Calcium: 10.2 mg/dL (ref 8.7–10.3)
Chloride: 106 mmol/L (ref 96–106)
Creatinine, Ser: 1.21 mg/dL — ABNORMAL HIGH (ref 0.57–1.00)
Globulin, Total: 3.3 g/dL (ref 1.5–4.5)
Glucose: 183 mg/dL — ABNORMAL HIGH (ref 65–99)
Potassium: 4.6 mmol/L (ref 3.5–5.2)
Sodium: 146 mmol/L — ABNORMAL HIGH (ref 134–144)
Total Protein: 7.5 g/dL (ref 6.0–8.5)
eGFR: 51 mL/min/{1.73_m2} — ABNORMAL LOW (ref >59)

## 2021-06-02 LAB — SEDIMENTATION RATE: Sed Rate: 13 mm/hr (ref 0–40)

## 2021-06-02 LAB — VITAMIN B12: Vitamin B-12: 304 pg/mL (ref 232–1245)

## 2021-06-02 LAB — PROTEIN ELECTROPHORESIS
A/G Ratio: 1.2 (ref 0.7–1.7)
Albumin ELP: 4.1 g/dL (ref 2.9–4.4)
Alpha 1: 0.2 g/dL (ref 0.0–0.4)
Alpha 2: 0.8 g/dL (ref 0.4–1.0)
Beta: 1.3 g/dL (ref 0.7–1.3)
Gamma Globulin: 1.1 g/dL (ref 0.4–1.8)
Globulin, Total: 3.4 g/dL (ref 2.2–3.9)

## 2021-06-02 LAB — CBC WITH DIFFERENTIAL/PLATELET
Basophils Absolute: 0.1 10*3/uL (ref 0.0–0.2)
Basos: 1 %
EOS (ABSOLUTE): 0.3 10*3/uL (ref 0.0–0.4)
Eos: 3 %
Hematocrit: 43.6 % (ref 34.0–46.6)
Hemoglobin: 14.3 g/dL (ref 11.1–15.9)
Immature Grans (Abs): 0 10*3/uL (ref 0.0–0.1)
Immature Granulocytes: 0 %
Lymphocytes Absolute: 5.9 10*3/uL — ABNORMAL HIGH (ref 0.7–3.1)
Lymphs: 48 %
MCH: 27.6 pg (ref 26.6–33.0)
MCHC: 32.8 g/dL (ref 31.5–35.7)
MCV: 84 fL (ref 79–97)
Monocytes Absolute: 0.6 10*3/uL (ref 0.1–0.9)
Monocytes: 5 %
Neutrophils Absolute: 5.2 10*3/uL (ref 1.4–7.0)
Neutrophils: 43 %
Platelets: 390 10*3/uL (ref 150–450)
RBC: 5.18 x10E6/uL (ref 3.77–5.28)
RDW: 13.2 % (ref 11.7–15.4)
WBC: 12.2 10*3/uL — ABNORMAL HIGH (ref 3.4–10.8)

## 2021-06-02 LAB — THYROID PANEL WITH TSH
Free Thyroxine Index: 1.8 (ref 1.2–4.9)
T3 Uptake Ratio: 23 % — ABNORMAL LOW (ref 24–39)
T4, Total: 8 ug/dL (ref 4.5–12.0)
TSH: 2.85 u[IU]/mL (ref 0.450–4.500)

## 2021-06-02 LAB — C-REACTIVE PROTEIN: CRP: 3 mg/L (ref 0–10)

## 2021-06-02 LAB — HGB A1C W/O EAG: Hgb A1c MFr Bld: 9.1 % — ABNORMAL HIGH (ref 4.8–5.6)

## 2021-06-02 LAB — HIV ANTIBODY (ROUTINE TESTING W REFLEX): HIV Screen 4th Generation wRfx: NONREACTIVE

## 2021-06-02 LAB — RPR: RPR Ser Ql: NONREACTIVE

## 2021-06-02 LAB — VITAMIN D 25 HYDROXY (VIT D DEFICIENCY, FRACTURES): Vit D, 25-Hydroxy: 54.3 ng/mL (ref 30.0–100.0)

## 2021-06-02 LAB — FOLATE: Folate: 5.8 ng/mL (ref >3.0)

## 2021-06-02 LAB — ANA W/REFLEX IF POSITIVE: Anti Nuclear Antibody (ANA): NEGATIVE

## 2021-06-02 LAB — CK: Total CK: 58 U/L (ref 32–182)

## 2021-06-02 NOTE — Telephone Encounter (Signed)
I spoke to the patient. She verbalized understanding of her lab findings and will contact her PCP.  She would like to be referred to PT for gait difficulty and balance problems. Orders placed in Epic.

## 2021-06-02 NOTE — Telephone Encounter (Signed)
Please call patient, laboratory evaluation showed significantly elevated A1c of 9.1, poorly controlled diabetes, I have forwarded the lab result to her primary care at [place], she should contact her primary care office to address diabetes issues  In addition, there is evidence of abnormal kidney function, slight worsening than her baseline,  Evidence of elevated WBC 12.2,  She should contact her primary care office for evaluation to rule out active infection such as UTI, upper respiratory infection  ESR C-reactive protein has much improved compared to previous study,  I may refer her to physical therapy if she wants to proceed

## 2021-06-02 NOTE — Telephone Encounter (Signed)
Referral for physical therapy sent to [place],. P: [id number].

## 2021-06-02 NOTE — Telephone Encounter (Signed)
Left message for a return call. She my speak to anyone in POD 2.

## 2021-06-02 NOTE — Addendum Note (Signed)
Addended by: Noberto Retort C on: 06/02/2021 04:16 PM   Modules accepted: Orders

## 2021-06-03 ENCOUNTER — Ambulatory Visit: Payer: Medicare HMO | Admitting: Neurology

## 2021-06-07 DIAGNOSIS — E782 Mixed hyperlipidemia: Secondary | ICD-10-CM | POA: Diagnosis not present

## 2021-06-07 DIAGNOSIS — F17200 Nicotine dependence, unspecified, uncomplicated: Secondary | ICD-10-CM | POA: Diagnosis not present

## 2021-06-07 DIAGNOSIS — E669 Obesity, unspecified: Secondary | ICD-10-CM | POA: Diagnosis not present

## 2021-06-07 DIAGNOSIS — Z8639 Personal history of other endocrine, nutritional and metabolic disease: Secondary | ICD-10-CM | POA: Diagnosis not present

## 2021-06-07 DIAGNOSIS — E1165 Type 2 diabetes mellitus with hyperglycemia: Secondary | ICD-10-CM | POA: Diagnosis not present

## 2021-06-07 DIAGNOSIS — I10 Essential (primary) hypertension: Secondary | ICD-10-CM | POA: Diagnosis not present

## 2021-06-08 ENCOUNTER — Telehealth: Payer: Self-pay

## 2021-06-08 NOTE — Telephone Encounter (Signed)
Led and spoke with patient in regards to if she has ever had a sleep study, patient was not sure of the location of the test, nothing further needed.

## 2021-06-09 ENCOUNTER — Other Ambulatory Visit: Payer: Self-pay

## 2021-06-09 ENCOUNTER — Ambulatory Visit (INDEPENDENT_AMBULATORY_CARE_PROVIDER_SITE_OTHER): Payer: Medicare HMO | Admitting: Internal Medicine

## 2021-06-09 ENCOUNTER — Encounter: Payer: Self-pay | Admitting: Internal Medicine

## 2021-06-09 VITALS — BP 120/60 | HR 61 | Temp 98.4°F | Ht 61.5 in | Wt 183.0 lb

## 2021-06-09 DIAGNOSIS — U071 COVID-19: Secondary | ICD-10-CM | POA: Diagnosis not present

## 2021-06-09 DIAGNOSIS — G4719 Other hypersomnia: Secondary | ICD-10-CM | POA: Diagnosis not present

## 2021-06-09 DIAGNOSIS — F17200 Nicotine dependence, unspecified, uncomplicated: Secondary | ICD-10-CM

## 2021-06-09 DIAGNOSIS — F17210 Nicotine dependence, cigarettes, uncomplicated: Secondary | ICD-10-CM | POA: Diagnosis not present

## 2021-06-09 MED ORDER — BREZTRI AEROSPHERE 160-9-4.8 MCG/ACT IN AERO: 2.0000 | INHALATION_SPRAY | Freq: Two times a day (BID) | RESPIRATORY_TRACT | 0 refills | Status: DC

## 2021-06-09 NOTE — Progress Notes (Signed)
Name: Laura Camacho MRN: [id number] DOB: 04/20/1961     CONSULTATION DATE: 06/09/2021  REFERRING MD : Charlynn Grimes  CHIEF COMPLAINT: Excessive daytime sleepiness  HISTORY OF PRESENT ILLNESS:  Patient is seen today for problems and issues with sleep related to excessive daytime sleepiness Patient  has been having sleep problems for many years Patient has been having excessive daytime sleepiness for a long time Patient has been having extreme fatigue and tiredness, lack of energy +  very Loud snoring every night + struggling breathe at night and gasps for air  Patient has been diagnosed with OSA on CPAP Patient does not know any information about previous testing or settings or company   Discussed sleep data and reviewed with patient.  Encouraged proper weight management.  Discussed driving precautions and its relationship with hypersomnolence.  Discussed operating dangerous equipment and its relationship with hypersomnolence.  Discussed sleep hygiene, and benefits of a fixed sleep waked time.  The importance of getting eight or more hours of sleep discussed with patient.  Discussed limiting the use of the computer and television before bedtime.  Decrease naps during the day, so night time sleep will become enhanced.  Limit caffeine, and sleep deprivation.  HTN, stroke, and heart failure are potential risk factors.   PATIENT Diagnosed with COVID 19 infection 4 months ago Tested + last week  +wheezing and SOB and DOE No resp distress at this time  Smoking Assessment and Cessation Counseling Upon further questioning, Patient smokes 1/2 PPD I have advised patient to quit/stop smoking as soon as possible due to high risk for multiple medical problems  Patient is NOT willing to quit smoking  I have advised patient that we can assist and have options of Nicotine replacement therapy. I also advised patient on behavioral therapy and can provide oral medication therapy in  conjunction with the other therapies Follow up next Office visit  for assessment of smoking cessation Smoking cessation counseling advised for 4 minutes      PAST MEDICAL HISTORY :   has a past medical history of Complication of anesthesia, Diabetes mellitus without complication ([place]), Epilepsy ([place]), Fibromyalgia, GERD (gastroesophageal reflux disease), Glaucoma (increased eye pressure), Headache, Hyperlipidemia, Hypertension, Neurofibromatosis ([place]), and Pyelonephritis.  has a past surgical history that includes Colon surgery; Bilateral Arm Surgery (Bilateral); Colonoscopy with propofol (N/A, 02/12/2016); Esophagogastroduodenoscopy (egd) with propofol (N/A, 02/12/2016); Abdominal hysterectomy; Cervical fusion; Vulvectomy (N/A, 06/26/2017); Breast biopsy (Left, 11/05/2018); Breast biopsy (Left, 11/05/2018); Esophagogastroduodenoscopy (egd) with propofol (N/A, 06/30/2020); Colonoscopy with propofol (N/A, 06/30/2020); and Colonoscopy (N/A, 07/27/2020). Prior to Admission medications   Medication Sig Start Date End Date Taking? Authorizing Provider  amLODipine (NORVASC) 5 MG tablet Take 5 mg by mouth daily.   Yes [provider]  atorvastatin (LIPITOR) 80 MG tablet Take 80 mg by mouth daily.   Yes [provider]  Blood Glucose Calibration (TRUE METRIX LEVEL 1) Low SOLN  04/22/21  Yes [provider]  chlorthalidone (HYGROTON) 25 MG tablet Take by mouth. 05/22/20  Yes [provider]  dapagliflozin propanediol (FARXIGA) 10 MG TABS tablet Take 10 mg by mouth daily.   Yes [provider]  dexlansoprazole (DEXILANT) 60 MG capsule Take 60 mg by mouth daily.   Yes [provider]  diltiazem (DILACOR XR) 180 MG 24 hr capsule Take 360 mg by mouth at bedtime.   Yes [provider]  Docusate Calcium (STOOL SOFTENER PO) Take 1 capsule by mouth daily.   Yes [provider]  dorzolamide-timolol (  COSOPT) 22.3-6.8 MG/ML ophthalmic solution Place 1  drop into both eyes 2 (two) times daily.   Yes [provider]  DULoxetine (CYMBALTA) 30 MG capsule Take 1 capsule (30 mg total) by mouth daily. 05/31/21  Yes Marcial Pacas, MD  ergocalciferol (VITAMIN D2) 1.25 MG (50000 UT) capsule Take 50,000 Units by mouth once a week.   Yes [provider]  fluticasone (FLONASE) 50 MCG/ACT nasal spray Place 1 spray into both nostrils daily as needed for allergies or rhinitis.   Yes [provider]  gabapentin (NEURONTIN) 300 MG capsule Take 300-600 mg by mouth at bedtime as needed (pain-pt takes 1-2 capsules at bedtime PRN).    Yes [provider]  LANTUS SOLOSTAR 100 UNIT/ML Solostar Pen Inject 30 Units into the skin at bedtime.  12/25/17  Yes [provider]  losartan (COZAAR) 100 MG tablet Take 100 mg by mouth daily.   Yes [provider]  Nebivolol HCl 20 MG TABS Take 20 mg by mouth daily.    Yes [provider]  omeprazole (PRILOSEC) 40 MG capsule Take 40 mg by mouth at bedtime.   Yes [provider]  phenazopyridine (PYRIDIUM) 100 MG tablet Take 1 tablet (100 mg total) by mouth 3 (three) times daily as needed for pain. 11/19/18  Yes Sainani, Belia Heman, MD  Suvorexant (BELSOMRA) 10 MG TABS Take 10 mg by mouth at bedtime as needed.   Yes [provider]  traZODone (DESYREL) 50 MG tablet Take 50 mg by mouth at bedtime.   Yes [provider]   Allergies  Allergen Reactions   Codeine Anaphylaxis    "Throat swells up"   Bupropion     Unknown   Cefuroxime Rash   Ciprofloxacin Rash   Fluconazole Itching   Ibuprofen Itching   Iodine Hives   Ivp Dye [Iodinated Diagnostic Agents] Hives   Metformin And Related Itching   Nizoral [Ketoconazole] Other (See Comments)    Dizzy/ Fainting   Sulfa Antibiotics Hives   Voltaren [Diclofenac Sodium] Diarrhea and Nausea And Vomiting    FAMILY HISTORY:  family history includes Breast cancer in her cousin; Diabetes in her father,  maternal aunt, maternal uncle, and mother. SOCIAL HISTORY:  reports that she has been smoking cigarettes. She has been smoking an average of .5 packs per day. She has never used smokeless tobacco. She reports current alcohol use of about 3.0 - 4.0 standard drinks of alcohol per week. She reports previous drug use. Drug: Marijuana.   Review of Systems:  Gen:  Denies  fever, sweats, chills weight loss  HEENT: Denies blurred vision, double vision, ear pain, eye pain, hearing loss, nose bleeds, sore throat Cardiac:  No dizziness, chest pain or heaviness, chest tightness,edema, No JVD Resp:   + cough, -sputum production, +shortness of breath,+wheezing, -hemoptysis,  Gi: Denies swallowing difficulty, stomach pain, nausea or vomiting, diarrhea, constipation, bowel incontinence Gu:  Denies bladder incontinence, burning urine Ext:   Denies Joint pain, stiffness or swelling Skin: Denies  skin rash, easy bruising or bleeding or hives Endoc:  Denies polyuria, polydipsia , polyphagia or weight change Psych:   Denies depression, insomnia or hallucinations  Other:  All other systems negative   ALL OTHER ROS ARE NEGATIVE  BP 120/60 (BP Location: Left Arm, Patient Position: Sitting, Cuff Size: Normal)   Pulse 61   Temp 98.4 F (36.9 C) (Oral)   Ht 5' 1.5" (1.562 m)   Wt 183 lb (83 kg)   SpO2 99%  BMI 34.02 kg/m      Physical Examination:   General Appearance: No distress  EYES PERRLA, EOM intact.   NECK Supple, No JVD Pulmonary: normal breath sounds, No wheezing.  CardiovascularNormal S1,S2.  No m/r/g.   Abdomen: Benign, Soft, non-tender. Skin:   warm, no rashes, no ecchymosis  Extremities: normal, no cyanosis, clubbing. Neuro:without focal findings,  speech normal  PSYCHIATRIC: Mood, affect within normal limits.   ALL OTHER ROS ARE NEGATIVE       ASSESSMENT AND PLAN SYNOPSIS   61 year old African-American female with a previous infection of COVID-19 pneumonia with  progressive shortness of breath increased wheezing and cough along with underlying sleep apnea seen today for assessment for wheezing  After further assessment patient will continue to wheeze for several months especially in the setting of active tobacco abuse Will start Breztri 2 puffs twice daily Advised to wear mask to protect others from [place] Will avoid prednisone for now and start inhaled corticosteroids and see if she has any response   Regarding her sleep apnea, she does not know her settings when she was tested all she knows that she has been diagnosed for many years We will need to obtain sleep apnea records   Based on her age and tobacco abuse history patient likely has COPD and will need to be enrolled into the lung cancer screening program     MEDICATION ADJUSTMENTS/LABS AND TESTS ORDERED: PLEASE STOP SMOKING  START BREZTRI 2 puffs twice daily   Please wear Mask to protect others from COVID  We will need sleep apnea records  Enrollment into [place]   Patient  satisfied with Plan of action and management. All questions answered  Follow up 4-5 months  Total Time Spent  49 mins   Corrin Parker, M.D.  [facility]  Medical Director [place] Director [facility] Cardio-Pulmonary Department

## 2021-06-09 NOTE — Patient Instructions (Signed)
PLEASE STOP SMOKING  START BREZTRI 2 puffs twice daily   Please wear Mask to protect others from COVID  We will need sleep apnea records  Enrollment into [place]

## 2021-06-26 ENCOUNTER — Other Ambulatory Visit: Payer: Self-pay | Admitting: Neurology

## 2021-07-09 DIAGNOSIS — F32A Depression, unspecified: Secondary | ICD-10-CM | POA: Diagnosis not present

## 2021-07-09 DIAGNOSIS — E1165 Type 2 diabetes mellitus with hyperglycemia: Secondary | ICD-10-CM | POA: Diagnosis not present

## 2021-07-09 DIAGNOSIS — G4733 Obstructive sleep apnea (adult) (pediatric): Secondary | ICD-10-CM | POA: Diagnosis not present

## 2021-07-09 DIAGNOSIS — I10 Essential (primary) hypertension: Secondary | ICD-10-CM | POA: Diagnosis not present

## 2021-07-13 ENCOUNTER — Other Ambulatory Visit: Payer: Self-pay | Admitting: *Deleted

## 2021-07-13 DIAGNOSIS — F17210 Nicotine dependence, cigarettes, uncomplicated: Secondary | ICD-10-CM

## 2021-07-13 DIAGNOSIS — Z87891 Personal history of nicotine dependence: Secondary | ICD-10-CM

## 2021-07-15 ENCOUNTER — Ambulatory Visit (INDEPENDENT_AMBULATORY_CARE_PROVIDER_SITE_OTHER): Payer: Medicare HMO | Admitting: Neurology

## 2021-07-15 ENCOUNTER — Encounter: Payer: Self-pay | Admitting: Neurology

## 2021-07-15 VITALS — BP 98/62 | HR 62 | Ht 61.0 in | Wt 184.0 lb

## 2021-07-15 DIAGNOSIS — M545 Low back pain, unspecified: Secondary | ICD-10-CM

## 2021-07-15 DIAGNOSIS — R269 Unspecified abnormalities of gait and mobility: Secondary | ICD-10-CM | POA: Diagnosis not present

## 2021-07-15 DIAGNOSIS — R52 Pain, unspecified: Secondary | ICD-10-CM

## 2021-07-15 DIAGNOSIS — Q8500 Neurofibromatosis, unspecified: Secondary | ICD-10-CM | POA: Diagnosis not present

## 2021-07-15 MED ORDER — DULOXETINE HCL 60 MG PO CPEP: 60.0000 mg | ORAL_CAPSULE | Freq: Every day | ORAL | 3 refills | Status: DC

## 2021-07-15 NOTE — Patient Instructions (Signed)
Orders Placed This Encounter  Procedures   Ambulatory referral to Physical Therapy      Meds ordered this encounter  Medications   DULoxetine (CYMBALTA) 60 MG capsule    Sig: Take 1 capsule (60 mg total) by mouth daily.    Dispense:  90 capsule    Refill:  3

## 2021-07-15 NOTE — Progress Notes (Signed)
Chief Complaint  Patient presents with   Follow-up    Rm 23, with granddaughter, pt is not sure if she is improving, reports no new concerns or questions       ASSESSMENT AND PLAN  Laura Camacho is a 60 y.o. female   Diffuse body achy pain, gait abnormality  Significantly elevated ESR 100 in April 2022, suggestive of polymyalgia rheumatica,  Repeated ESR C-reactive protein were within normal limit,  Increase Cymbalta to 60 mg daily  Referral to physical therapy  History of cervical decompression C3-4, 4 5, 5 6 Neurofibromatosis type I  Only mild involvement, strong positive family history.  DIAGNOSTIC DATA (LABS, IMAGING, TESTING) - I reviewed patient records, labs, notes, testing and imaging myself where available.  MRI cervical at [facility] in August 2021: 1. Cervical spondylosis and degenerative disc disease, causing mild  to moderate impingement at C6-7 and mild impingement at C2-3, C3-4,  and C5-6.  2. Interbody fusion at C3-4, C4-5, and C5-6.   MRI lumbar in August 2021. 1. Lumbar spondylosis and degenerative disc disease, causing mild impingement at the L4-5 and L5-S1 levels, slightly worsened from prior. 2. Minimal dextroconvex lumbar scoliosis with rotary component.      MRI cervical in August 2021 [facility] Image 1. Cervical spondylosis and degenerative disc disease, causing mild to moderate impingement at C6-7 and mild impingement at C2-3, C3-4, and C5-6. 2. Interbody fusion at C3-4, C4-5, and C5-6. 3. Despite efforts by the technologist and patient, motion artifact is present on today's exam and could not be eliminated. This reduces exam sensitivity and specificity.  Laboratory evaluations in April 2022, triglyceride was 278, LDL was 77, CMP showed creatinine mildly elevated 1.2, glucose 244, C-reactive protein was 19, ESR was 100, liver functional test showed decreased albumin of 2.8, ALT of 12,   MEDICAL HISTORY:  Laura, is a  60 year old female, seen in request by primary care physician from [place], for evaluation of increased difficulty walking, diffuse body achy pain, she is accompanied by her granddaughter Melven Sartorius at today's visit on June 02, 2021  I reviewed and summarized the referring note. PMHX. HTN HLD DM since 2010 GERD. Cervical fusion, [place] more than 10 years ago.  She reported a strong family history of neurofibromatosis type I, also called von Recklinghausen disease, her mother, has severe disease, had multiple brain surgery, died at her young age, she is #3 of 5 children, 2 older brothers suffered significant disease, 1 younger brother suffered what sounds like malignant neurofibromatosis, the other surviving younger brothers show no sign of it, she had a big right arm neurofibroma growth about 20 years ago, had surgery at [facility].  postsurgically, she was looking after her grandchildren, has to hold on piece of falling window glass, which has  re-injured her right arm, she has some residual right arm paresthesia involving her right hand, intermittent right arm weakness since then, she has to really focusing when she tried to use her arm,  She also had cervical decompression fusion at [facility] in 2011, for neck pain, radiating pain to arms, surgery initially was helpful,  But over past decade, she often complains of intermittent body achy pain, involving bilateral upper and lower extremities, neck, low back, especially since beginning of 2022, she now complains of diffuse body achy pain, low back pain, neck pain, bilateral arm paresthesia, right worse than left, referred to physical therapy, all with limited help,  She has 1 daughter, and grandchildrens, none of them showed  signs of neurofibromatosis  Laboratory evaluation in February 2022 showed significantly elevated ESR, C-reactive protein, negative ANA, this happened in the setting of urinary tract infection  UPDATE Sept 8 2022: She is  accompanied by her granddaughter at today's clinical visit, complains of woozy headache, unsteady sensation, has trouble sleeping, Cymbalta 30 mg daily provides some help, helped her body achy pain some  Reviewed previous laboratory evaluations in July 2022, normal protein electrophoresis, CMP showed creatinine 1.2, normal vitamin D, CBC was mildly elevated 12.2, normal ANA, ESR C-reactive protein, CPK, HIV, RPR, B12, normal TSH, T4 total  Significantly elevated A1c of 9.1,  PHYSICAL EXAM:   Vitals:   07/15/21 1310  BP: 98/62  Pulse: 62  Weight: 184 lb (83.5 kg)  Height: '5\' 1"'  (1.549 m)   Not recorded     Body mass index is 34.77 kg/m.  PHYSICAL EXAMNIATION:  Gen: NAD, conversant, well nourised, well groomed    NEUROLOGICAL EXAM:  MENTAL STATUS: Middle-age, depressed looking female  speech/cognition: Awake, alert, oriented to history taking care of conversation CRANIAL NERVES: CN II: Visual fields are full to confrontation. Pupils are round equal and briskly reactive to light. CN III, IV, VI: extraocular movement are normal. No ptosis. CN V: Facial sensation is intact to light touch CN VII: Face is symmetric with normal eye closure  CN VIII: Hearing is normal to causal conversation. CN IX, X: Phonation is normal. CN XI: Head turning and shoulder shrug are intact  MOTOR: Complains of tenderness on deep palpitation of the muscles, no significant bilateral upper and lower extremity proximal and distal muscle weakness,  REFLEXES: Reflexes are 2+ and symmetric at the biceps, triceps, knees, and ankles. Plantar responses are flexor.  SENSORY: Intact to light touch, pinprick and vibratory sensation are intact in fingers and toes.  COORDINATION: There is no trunk or limb dysmetria noted.  GAIT/STANCE: Need push-up to get up from seated position, mildly antalgic, wide-based, cautious, but steady  REVIEW OF SYSTEMS:  Full 14 system review of systems performed and notable  only for as above All other review of systems were negative.   ALLERGIES: Allergies  Allergen Reactions   Codeine Anaphylaxis    "Throat swells up"   Bupropion     Unknown   Cefuroxime Rash   Ciprofloxacin Rash   Fluconazole Itching   Ibuprofen Itching   Iodine Hives   Ivp Dye [Iodinated Diagnostic Agents] Hives   Metformin And Related Itching   Nizoral [Ketoconazole] Other (See Comments)    Dizzy/ Fainting   Sulfa Antibiotics Hives   Voltaren [Diclofenac Sodium] Diarrhea and Nausea And Vomiting    HOME MEDICATIONS: Current Outpatient Medications  Medication Sig Dispense Refill   amLODipine (NORVASC) 5 MG tablet Take 5 mg by mouth daily.     atorvastatin (LIPITOR) 80 MG tablet Take 80 mg by mouth daily.     Blood Glucose Calibration (TRUE METRIX LEVEL 1) Low SOLN      Budeson-Glycopyrrol-Formoterol (BREZTRI AEROSPHERE) 160-9-4.8 MCG/ACT AERO Inhale 2 puffs into the lungs in the morning and at bedtime. 5.9 g 0   chlorthalidone (HYGROTON) 25 MG tablet Take by mouth.     dapagliflozin propanediol (FARXIGA) 10 MG TABS tablet Take 10 mg by mouth daily.     dexlansoprazole (DEXILANT) 60 MG capsule Take 60 mg by mouth daily.     diltiazem (DILACOR XR) 180 MG 24 hr capsule Take 360 mg by mouth at bedtime.     Docusate Calcium (STOOL SOFTENER PO) Take 1  capsule by mouth daily.     dorzolamide-timolol (COSOPT) 22.3-6.8 MG/ML ophthalmic solution Place 1 drop into both eyes 2 (two) times daily.     DULoxetine (CYMBALTA) 30 MG capsule TAKE 1 CAPSULE BY MOUTH EVERY DAY 90 capsule 3   ergocalciferol (VITAMIN D2) 1.25 MG (50000 UT) capsule Take 50,000 Units by mouth once a week.     fluticasone (FLONASE) 50 MCG/ACT nasal spray Place 1 spray into both nostrils daily as needed for allergies or rhinitis.     gabapentin (NEURONTIN) 300 MG capsule Take 300-600 mg by mouth at bedtime as needed (pain-pt takes 1-2 capsules at bedtime PRN).      LANTUS SOLOSTAR 100 UNIT/ML Solostar Pen Inject 30  Units into the skin at bedtime.   3   losartan (COZAAR) 100 MG tablet Take 100 mg by mouth daily.     Nebivolol HCl 20 MG TABS Take 20 mg by mouth daily.      omeprazole (PRILOSEC) 40 MG capsule Take 40 mg by mouth at bedtime.     phenazopyridine (PYRIDIUM) 100 MG tablet Take 1 tablet (100 mg total) by mouth 3 (three) times daily as needed for pain. 20 tablet 0   Suvorexant (BELSOMRA) 10 MG TABS Take 10 mg by mouth at bedtime as needed.     traZODone (DESYREL) 50 MG tablet Take 50 mg by mouth at bedtime.     No current facility-administered medications for this visit.    PAST MEDICAL HISTORY: Past Medical History:  Diagnosis Date   Complication of anesthesia    STATES WAS TOLD NOT TO TAKE UNKNOWN ANESTHESIA AFTER NECKFUSION OR HYSTERECTOMY   Diabetes mellitus without complication (HCC)    Epilepsy ([place])    Fibromyalgia    GERD (gastroesophageal reflux disease)    Glaucoma (increased eye pressure)    History of Glaucoma but no drops   Headache    Hyperlipidemia    Hypertension    Neurofibromatosis ([place])    Pyelonephritis     PAST SURGICAL HISTORY: Past Surgical History:  Procedure Laterality Date   ABDOMINAL HYSTERECTOMY     has both ovaries- [place] [facility]   Bilateral Arm Surgery Bilateral    'tumors' removed   BREAST BIOPSY Left 11/05/2018   [place] core venus clip  BIOPSY: GRANULAR CELL TUMOR, 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   BREAST BIOPSY Left 11/05/2018   [place] core axilla hydro marker  NEGATIVE FOR METASTATIC CARCINOMA   CERVICAL FUSION     COLON SURGERY     COLONOSCOPY N/A 07/27/2020   Procedure: COLONOSCOPY;  Surgeon: Lesly Rubenstein, MD;  Location: [facility] ENDOSCOPY;  Service: Endoscopy;  Laterality: N/A;   COLONOSCOPY WITH PROPOFOL N/A 02/12/2016   Procedure: COLONOSCOPY WITH PROPOFOL;  Surgeon: Josefine Class, MD;  Location: [facility] ENDOSCOPY;  Service: Endoscopy;  Laterality: N/A;   COLONOSCOPY WITH PROPOFOL N/A 06/30/2020   Procedure: COLONOSCOPY WITH PROPOFOL;  Surgeon:  Lesly Rubenstein, MD;  Location: [facility] ENDOSCOPY;  Service: Endoscopy;  Laterality: N/A;   ESOPHAGOGASTRODUODENOSCOPY (EGD) WITH PROPOFOL N/A 02/12/2016   Procedure: ESOPHAGOGASTRODUODENOSCOPY (EGD) WITH PROPOFOL;  Surgeon: Josefine Class, MD;  Location: [facility] ENDOSCOPY;  Service: Endoscopy;  Laterality: N/A;   ESOPHAGOGASTRODUODENOSCOPY (EGD) WITH PROPOFOL N/A 06/30/2020   Procedure: ESOPHAGOGASTRODUODENOSCOPY (EGD) WITH PROPOFOL;  Surgeon: Lesly Rubenstein, MD;  Location: [facility] ENDOSCOPY;  Service: Endoscopy;  Laterality: N/A;   VULVECTOMY N/A 06/26/2017   Procedure: WIDE EXCISION VULVECTOMY;  Surgeon: Brayton Mars, MD;  Location: [facility] ORS;  Service: Gynecology;  Laterality:  N/A;    FAMILY HISTORY: Family History  Problem Relation Age of Onset   Diabetes Mother    Diabetes Father    Diabetes Maternal Aunt    Diabetes Maternal Uncle    Breast cancer Cousin    Ovarian cancer Neg Hx    Colon cancer Neg Hx     SOCIAL HISTORY: Social History   Socioeconomic History   Marital status: Single    Spouse name: Not on file   Number of children: Not on file   Years of education: Not on file   Highest education level: Not on file  Occupational History   Not on file  Tobacco Use   Smoking status: Every Day    Packs/day: 0.50    Types: Cigarettes   Smokeless tobacco: Never   Tobacco comments:    0.5 ppd 06/09/2021  Vaping Use   Vaping Use: Never used  Substance and Sexual Activity   Alcohol use: Yes    Alcohol/week: 3.0 - 4.0 standard drinks    Types: 3 - 4 Shots of liquor per week    Comment: once a week   Drug use: Not Currently    Types: Marijuana    Comment: monthly   Sexual activity: Yes    Birth control/protection: Surgical  Other Topics Concern   Not on file  Social History Narrative   Not on file   Social Determinants of Health   Financial Resource Strain: Not on file  Food Insecurity: Not on file  Transportation Needs: Not on file  Physical  Activity: Not on file  Stress: Not on file  Social Connections: Not on file  Intimate Partner Violence: Not on file      Marcial Pacas, M.D. Ph.D.  [facility] [address] [place], [place] [address] Ph: [phone] Fax: [phone]  CC:  [place],  [place] [address]  [facility]

## 2021-07-22 ENCOUNTER — Ambulatory Visit: Admission: RE | Admit: 2021-07-22 | Payer: Medicare HMO | Source: Ambulatory Visit

## 2021-07-22 ENCOUNTER — Other Ambulatory Visit: Payer: Self-pay

## 2021-07-22 ENCOUNTER — Encounter: Payer: Self-pay | Admitting: Pulmonary Disease

## 2021-07-22 ENCOUNTER — Ambulatory Visit (INDEPENDENT_AMBULATORY_CARE_PROVIDER_SITE_OTHER): Payer: Medicare HMO | Admitting: Pulmonary Disease

## 2021-07-22 DIAGNOSIS — F17200 Nicotine dependence, unspecified, uncomplicated: Secondary | ICD-10-CM

## 2021-07-22 NOTE — Progress Notes (Signed)
Virtual Visit via Telephone Note  I connected with Laura Camacho on 07/22/21 at 12:00 PM EDT by telephone and verified that I am speaking with the correct person using two identifiers.  Location: Patient: At Home Provider: [place]    I discussed the limitations, risks, security and privacy concerns of performing an evaluation and management service by telephone and the availability of in person appointments. I also discussed with the patient that there may be a patient responsible charge related to this service. The patient expressed understanding and agreed to proceed.   3 attempts were made to reach the patient. Unable to leave voicemail. Both phone numbers were tried.   Called [facility] to cancel CT   Shared Decision Making Visit Lung Cancer Screening Program   Wyn Quaker NP

## 2021-07-26 ENCOUNTER — Other Ambulatory Visit: Payer: Self-pay

## 2021-07-26 ENCOUNTER — Encounter: Payer: Self-pay | Admitting: Acute Care

## 2021-07-26 ENCOUNTER — Ambulatory Visit (INDEPENDENT_AMBULATORY_CARE_PROVIDER_SITE_OTHER): Payer: Medicare HMO | Admitting: Acute Care

## 2021-07-26 DIAGNOSIS — F17210 Nicotine dependence, cigarettes, uncomplicated: Secondary | ICD-10-CM

## 2021-07-26 NOTE — Progress Notes (Signed)
Virtual Visit via Telephone Note  I connected with Laura Camacho on 07/26/21 at  3:30 PM EDT by telephone and verified that I am speaking with the correct person using two identifiers.  Location: Patient: at home Provider: [place], [place], [place], [address]    I discussed the limitations, risks, security and privacy concerns of performing an evaluation and management service by telephone and the availability of in person appointments. I also discussed with the patient that there may be a patient responsible charge related to this service. The patient expressed understanding and agreed to proceed.   Shared Decision Making Visit Lung Cancer Screening Program [phone])   Eligibility: Age 60 y.o. Pack Years Smoking History Calculation 29 pack year smoking history (# packs/per year x # years smoked) Recent History of coughing up blood  no Unexplained weight loss? no ( >Than 15 pounds within the last 6 months ) Prior History Lung / other cancer no (Diagnosis within the last 5 years already requiring surveillance chest CT Scans). Smoking Status Current Smoker Former Smokers: Years since quit:  NA  Quit Date:  NA  Visit Components: Discussion included one or more decision making aids. yes Discussion included risk/benefits of screening. yes Discussion included potential follow up diagnostic testing for abnormal scans. yes Discussion included meaning and risk of over diagnosis. yes Discussion included meaning and risk of False Positives. yes Discussion included meaning of total radiation exposure. yes  Counseling Included: Importance of adherence to annual lung cancer LDCT screening. yes Impact of comorbidities on ability to participate in the program. yes Ability and willingness to under diagnostic treatment. yes  Smoking Cessation Counseling: Current Smokers:  Discussed importance of smoking cessation. yes Information about tobacco cessation classes and  interventions provided to patient. yes Patient provided with "ticket" for LDCT Scan. yes Symptomatic Patient. no  Counseling NA Diagnosis Code: Tobacco Use Z72.0 Asymptomatic Patient yes  Counseling (Intermediate counseling: > three minutes counseling) [id number] Former Smokers:  Discussed the importance of maintaining cigarette abstinence. yes Diagnosis Code: Personal History of Nicotine Dependence. [id number] Information about tobacco cessation classes and interventions provided to patient. Yes Patient provided with "ticket" for LDCT Scan. yes Written Order for Lung Cancer Screening with LDCT placed in Epic. Yes (CT Chest Lung Cancer Screening Low Dose W/O CM) [id number] Z12.2-Screening of respiratory organs Z87.891-Personal history of nicotine dependence  I have spent 25 minutes of face to face time with Ms. Maltz discussing the risks and benefits of lung cancer screening. We viewed a power point together that explained in detail the above noted topics. We paused at intervals to allow for questions to be asked and answered to ensure understanding.We discussed that the single most powerful action that she can take to decrease her risk of developing lung cancer is to quit smoking. We discussed whether or not she is ready to commit to setting a quit date. We discussed options for tools to aid in quitting smoking including nicotine replacement therapy, non-nicotine medications, support groups, Quit Smart classes, and behavior modification. We discussed that often times setting smaller, more achievable goals, such as eliminating 1 cigarette a day for a week and then 2 cigarettes a day for a week can be helpful in slowly decreasing the number of cigarettes smoked. This allows for a sense of accomplishment as well as providing a clinical benefit. I gave her the " Be Stronger Than Your Excuses" card with contact information for community resources, classes, free nicotine replacement therapy, and access to mobile  apps, text messaging, and on-line smoking cessation help. I have also given her my card and contact information in the event she needs to contact me. We discussed the time and location of the scan, and that either Doroteo Glassman RN or I will call with the results within 24-48 hours of receiving them. I have offered her  a copy of the power point we viewed  as a resource in the event they need reinforcement of the concepts we discussed today in the office. The patient verbalized understanding of all of  the above and had no further questions upon leaving the office. They have my contact information in the event they have any further questions.  I spent 3 minutes counseling on smoking cessation and the health risks of continued tobacco abuse.  I explained to the patient that there has been a high incidence of coronary artery disease noted on these exams. I explained that this is a non-gated exam therefore degree or severity cannot be determined. This patient is currently on statin therapy. I have asked the patient to follow-up with their PCP regarding any incidental finding of coronary artery disease and management with diet or medication as their PCP  feels is clinically indicated. The patient verbalized understanding of the above and had no further questions upon completion of the visit.      Magdalen Spatz, NP 07/26/2021

## 2021-07-26 NOTE — Patient Instructions (Signed)
Thank you for participating in the [facility] Scre[facility]. It was our pleasure to meet you today. We will call you with the results of your scan within the next few days. Your scan will be assigned a Lung RADS category score by the physicians reading the scans.  This Lung RADS score determines follow up scanning.  See below for description of categories, and follow up screening recommendations. We will be in touch to schedule your follow up screening annually or based on recommendations of our providers. We will fax a copy of your scan results to your Primary Care Physician, or the physician who referred you to the program, to ensure they have the results. Please call the office if you have any questions or concerns regarding your scanning experience or results.  Our office number is [phone]. Please speak with Denise Phelps, RN. She is our Lung Cancer Screening RN. If she is unavailable when you call, please have the office staff send her a message. She will return your call at her earliest convenience. Remember, if your scan is normal, we will scan you annually as long as you continue to meet the criteria for the program. (Age 55-77, Current smoker or smoker who has quit within the last 15 years). If you are a smoker, remember, quitting is the single most powerful action that you can take to decrease your risk of lung cancer and other pulmonary, breathing related problems. We know quitting is hard, and we are here to help.  Please let us know if there is anything we can do to help you meet your goal of quitting. If you are a former smoker, congratulations. We are proud of you! Remain smoke free! Remember you can refer friends or family members through the number above.  We will screen them to make sure they meet criteria for the program. Thank you for helping us take better care of you by participating in Lung Screening.  Lung RADS Categories:  Lung RADS 1: no nodules  or definitely non-concerning nodules.  Recommendation is for a repeat annual scan in 12 months.  Lung RADS 2:  nodules that are non-concerning in appearance and behavior with a very low likelihood of becoming an active cancer. Recommendation is for a repeat annual scan in 12 months.  Lung RADS 3: nodules that are probably non-concerning , includes nodules with a low likelihood of becoming an active cancer.  Recommendation is for a 6-month repeat screening scan. Often noted after an upper respiratory illness. We will be in touch to make sure you have no questions, and to schedule your 6-month scan.  Lung RADS 4 A: nodules with concerning findings, recommendation is most often for a follow up scan in 3 months or additional testing based on our provider's assessment of the scan. We will be in touch to make sure you have no questions and to schedule the recommended 3 month follow up scan.  Lung RADS 4 B:  indicates findings that are concerning. We will be in touch with you to schedule additional diagnostic testing based on our provider's  assessment of the scan.   

## 2021-07-27 ENCOUNTER — Ambulatory Visit
Admission: RE | Admit: 2021-07-27 | Discharge: 2021-07-27 | Disposition: A | Discharge status: Home / Self Care | Payer: Medicare HMO | Source: Ambulatory Visit | Attending: Acute Care | Admitting: Acute Care

## 2021-07-27 ENCOUNTER — Other Ambulatory Visit: Payer: Self-pay

## 2021-07-27 DIAGNOSIS — Z87891 Personal history of nicotine dependence: Secondary | ICD-10-CM | POA: Diagnosis not present

## 2021-07-27 DIAGNOSIS — F17210 Nicotine dependence, cigarettes, uncomplicated: Secondary | ICD-10-CM | POA: Insufficient documentation

## 2021-07-30 DIAGNOSIS — G4700 Insomnia, unspecified: Secondary | ICD-10-CM | POA: Diagnosis not present

## 2021-07-30 DIAGNOSIS — E1165 Type 2 diabetes mellitus with hyperglycemia: Secondary | ICD-10-CM | POA: Diagnosis not present

## 2021-07-30 DIAGNOSIS — G4733 Obstructive sleep apnea (adult) (pediatric): Secondary | ICD-10-CM | POA: Diagnosis not present

## 2021-07-30 DIAGNOSIS — I10 Essential (primary) hypertension: Secondary | ICD-10-CM | POA: Diagnosis not present

## 2021-07-30 DIAGNOSIS — K219 Gastro-esophageal reflux disease without esophagitis: Secondary | ICD-10-CM | POA: Diagnosis not present

## 2021-08-03 ENCOUNTER — Other Ambulatory Visit: Payer: Self-pay | Admitting: Acute Care

## 2021-08-03 DIAGNOSIS — F17210 Nicotine dependence, cigarettes, uncomplicated: Secondary | ICD-10-CM

## 2021-11-19 DIAGNOSIS — E781 Pure hyperglyceridemia: Secondary | ICD-10-CM | POA: Diagnosis not present

## 2021-11-19 DIAGNOSIS — E785 Hyperlipidemia, unspecified: Secondary | ICD-10-CM | POA: Diagnosis not present

## 2021-11-19 DIAGNOSIS — R5383 Other fatigue: Secondary | ICD-10-CM | POA: Diagnosis not present

## 2021-11-19 DIAGNOSIS — E1165 Type 2 diabetes mellitus with hyperglycemia: Secondary | ICD-10-CM | POA: Diagnosis not present

## 2021-11-19 DIAGNOSIS — E118 Type 2 diabetes mellitus with unspecified complications: Secondary | ICD-10-CM | POA: Diagnosis not present

## 2021-11-19 DIAGNOSIS — I10 Essential (primary) hypertension: Secondary | ICD-10-CM | POA: Diagnosis not present

## 2021-11-25 DIAGNOSIS — K219 Gastro-esophageal reflux disease without esophagitis: Secondary | ICD-10-CM | POA: Diagnosis not present

## 2021-11-25 DIAGNOSIS — E1149 Type 2 diabetes mellitus with other diabetic neurological complication: Secondary | ICD-10-CM | POA: Diagnosis not present

## 2021-11-25 DIAGNOSIS — I10 Essential (primary) hypertension: Secondary | ICD-10-CM | POA: Diagnosis not present

## 2021-11-25 DIAGNOSIS — M545 Low back pain, unspecified: Secondary | ICD-10-CM | POA: Diagnosis not present

## 2021-11-25 DIAGNOSIS — E785 Hyperlipidemia, unspecified: Secondary | ICD-10-CM | POA: Diagnosis not present

## 2021-11-25 DIAGNOSIS — E1165 Type 2 diabetes mellitus with hyperglycemia: Secondary | ICD-10-CM | POA: Diagnosis not present

## 2022-03-22 ENCOUNTER — Other Ambulatory Visit: Payer: Self-pay | Admitting: Ophthalmology

## 2022-03-22 DIAGNOSIS — E083599 Diabetes mellitus due to underlying condition with proliferative diabetic retinopathy without macular edema, unspecified eye: Secondary | ICD-10-CM

## 2022-03-30 ENCOUNTER — Ambulatory Visit
Admission: RE | Admit: 2022-03-30 | Discharge: 2022-03-30 | Disposition: A | Discharge status: Home / Self Care | Payer: Medicare Other | Source: Ambulatory Visit | Attending: Ophthalmology | Admitting: Ophthalmology

## 2022-03-30 DIAGNOSIS — H211X9 Other vascular disorders of iris and ciliary body, unspecified eye: Secondary | ICD-10-CM | POA: Insufficient documentation

## 2022-03-30 DIAGNOSIS — E083599 Diabetes mellitus due to underlying condition with proliferative diabetic retinopathy without macular edema, unspecified eye: Secondary | ICD-10-CM | POA: Diagnosis present

## 2022-03-30 DIAGNOSIS — E0839 Diabetes mellitus due to underlying condition with other diabetic ophthalmic complication: Secondary | ICD-10-CM | POA: Diagnosis present

## 2022-04-08 DIAGNOSIS — H4051X4 Glaucoma secondary to other eye disorders, right eye, indeterminate stage: Secondary | ICD-10-CM | POA: Diagnosis not present

## 2022-04-11 DIAGNOSIS — E113213 Type 2 diabetes mellitus with mild nonproliferative diabetic retinopathy with macular edema, bilateral: Secondary | ICD-10-CM | POA: Diagnosis not present

## 2022-04-12 DIAGNOSIS — E113513 Type 2 diabetes mellitus with proliferative diabetic retinopathy with macular edema, bilateral: Secondary | ICD-10-CM | POA: Diagnosis not present

## 2022-04-15 DIAGNOSIS — I10 Essential (primary) hypertension: Secondary | ICD-10-CM | POA: Diagnosis not present

## 2022-04-15 DIAGNOSIS — E782 Mixed hyperlipidemia: Secondary | ICD-10-CM | POA: Diagnosis not present

## 2022-04-15 DIAGNOSIS — N189 Chronic kidney disease, unspecified: Secondary | ICD-10-CM | POA: Diagnosis not present

## 2022-04-15 DIAGNOSIS — F32A Depression, unspecified: Secondary | ICD-10-CM | POA: Diagnosis not present

## 2022-04-15 DIAGNOSIS — E1165 Type 2 diabetes mellitus with hyperglycemia: Secondary | ICD-10-CM | POA: Diagnosis not present

## 2022-04-15 DIAGNOSIS — E559 Vitamin D deficiency, unspecified: Secondary | ICD-10-CM | POA: Diagnosis not present

## 2022-04-26 DIAGNOSIS — H4051X4 Glaucoma secondary to other eye disorders, right eye, indeterminate stage: Secondary | ICD-10-CM | POA: Diagnosis not present

## 2022-04-26 DIAGNOSIS — H4051X3 Glaucoma secondary to other eye disorders, right eye, severe stage: Secondary | ICD-10-CM | POA: Diagnosis not present

## 2022-04-26 DIAGNOSIS — E1139 Type 2 diabetes mellitus with other diabetic ophthalmic complication: Secondary | ICD-10-CM | POA: Diagnosis not present

## 2022-04-26 DIAGNOSIS — H409 Unspecified glaucoma: Secondary | ICD-10-CM | POA: Diagnosis not present

## 2022-05-24 DIAGNOSIS — I10 Essential (primary) hypertension: Secondary | ICD-10-CM | POA: Diagnosis not present

## 2022-05-24 DIAGNOSIS — M1990 Unspecified osteoarthritis, unspecified site: Secondary | ICD-10-CM | POA: Diagnosis not present

## 2022-05-24 DIAGNOSIS — E1165 Type 2 diabetes mellitus with hyperglycemia: Secondary | ICD-10-CM | POA: Diagnosis not present

## 2022-05-24 DIAGNOSIS — Q8501 Neurofibromatosis, type 1: Secondary | ICD-10-CM | POA: Diagnosis not present

## 2022-06-06 DIAGNOSIS — G40209 Localization-related (focal) (partial) symptomatic epilepsy and epileptic syndromes with complex partial seizures, not intractable, without status epilepticus: Secondary | ICD-10-CM | POA: Diagnosis not present

## 2022-06-06 DIAGNOSIS — R202 Paresthesia of skin: Secondary | ICD-10-CM | POA: Diagnosis not present

## 2022-06-06 DIAGNOSIS — G8929 Other chronic pain: Secondary | ICD-10-CM | POA: Diagnosis not present

## 2022-06-06 DIAGNOSIS — G629 Polyneuropathy, unspecified: Secondary | ICD-10-CM | POA: Diagnosis not present

## 2022-06-06 DIAGNOSIS — Q8500 Neurofibromatosis, unspecified: Secondary | ICD-10-CM | POA: Diagnosis not present

## 2022-06-06 DIAGNOSIS — M25511 Pain in right shoulder: Secondary | ICD-10-CM | POA: Diagnosis not present

## 2022-06-06 DIAGNOSIS — M542 Cervicalgia: Secondary | ICD-10-CM | POA: Diagnosis not present

## 2022-06-08 ENCOUNTER — Telehealth (INDEPENDENT_AMBULATORY_CARE_PROVIDER_SITE_OTHER): Payer: Self-pay

## 2022-06-08 NOTE — Telephone Encounter (Signed)
ATC pt on home phone to schedule an URGENT referral sent from Dr. Melrose Nakayama @ [facility]. VM was full and unable to LM. I called mobile number and person answered stating there was no [name] at this number. [phone]. If pt calls back, please schedule for an appt per Eulogio Ditch, NP, with Dr. Delana Meyer tomorrow 8.3.23 or with Eulogio Ditch, NP on Monday or Tuesday of next week (8.7.23 - 8.8.23) or with Dr. Delana Meyer on Monday 8.7.23. I double checked numbers from [facility] demographics sheet and the only number pt has listed is the [phone].

## 2022-06-08 NOTE — Telephone Encounter (Signed)
error 

## 2022-06-09 ENCOUNTER — Telehealth (INDEPENDENT_AMBULATORY_CARE_PROVIDER_SITE_OTHER): Payer: Self-pay

## 2022-06-09 NOTE — Telephone Encounter (Signed)
ATC pt - VM still full. Unable to LM.

## 2022-06-20 IMAGING — US US RENAL
1 series · 14 of 25 positions shown · non-contrast
Comparison: Renal ultrasound dated November 17, 2018.

CLINICAL DATA: Chronic kidney disease.

EXAM:
RENAL / URINARY TRACT ULTRASOUND COMPLETE

[Series 1: us renal · 0.25mm/px · 14 of 39 slices shown]
[im 1/39]
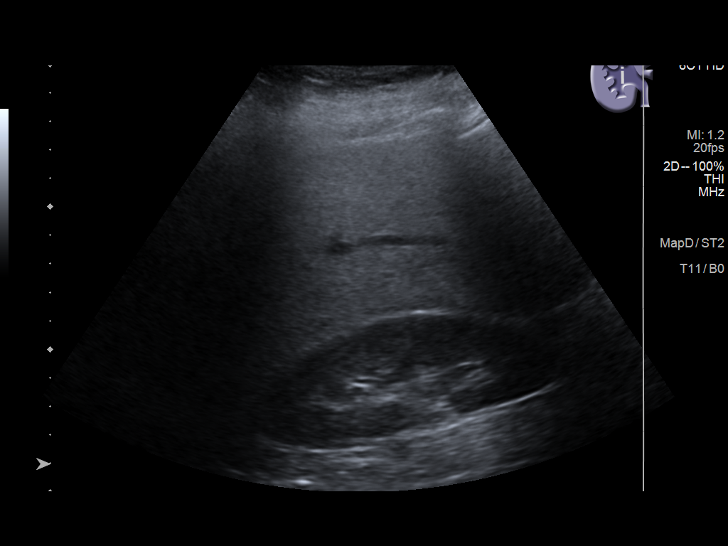
[im 4/39]
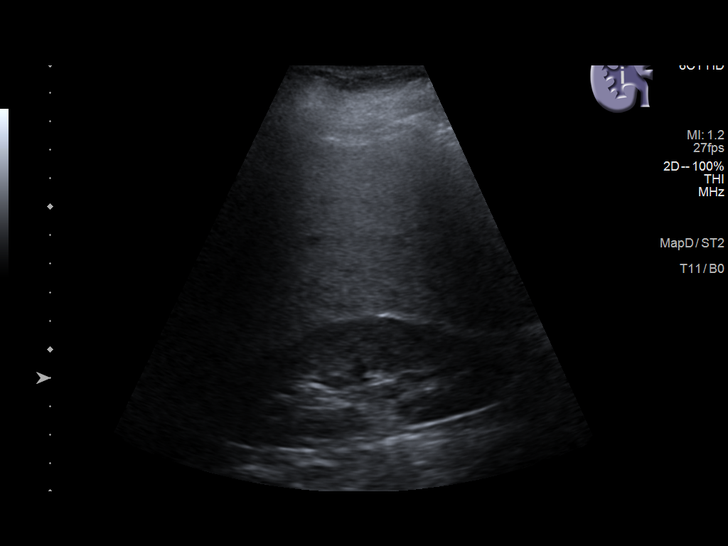
[im 7/39]
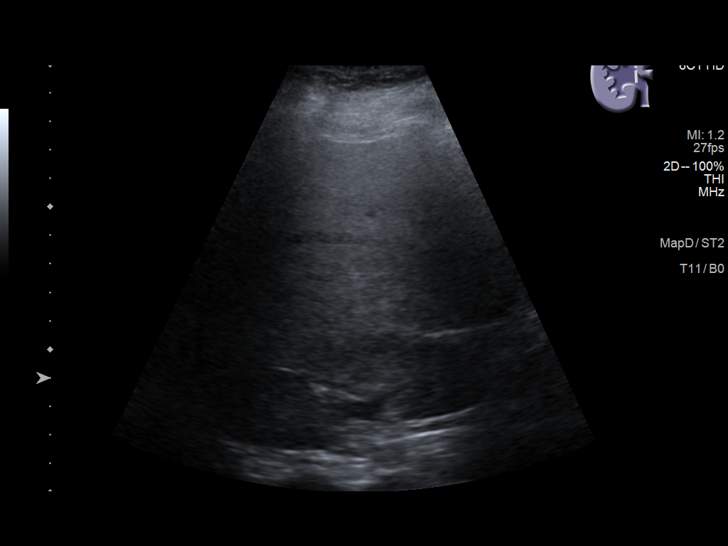
[im 10/39]
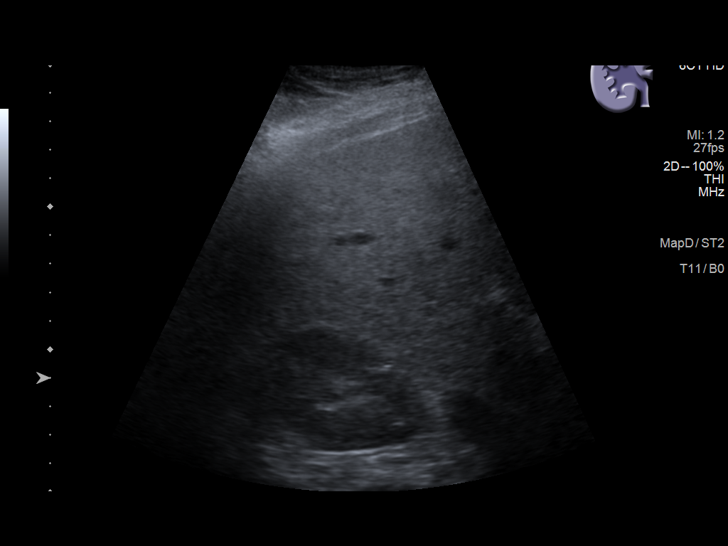
[im 13/39]
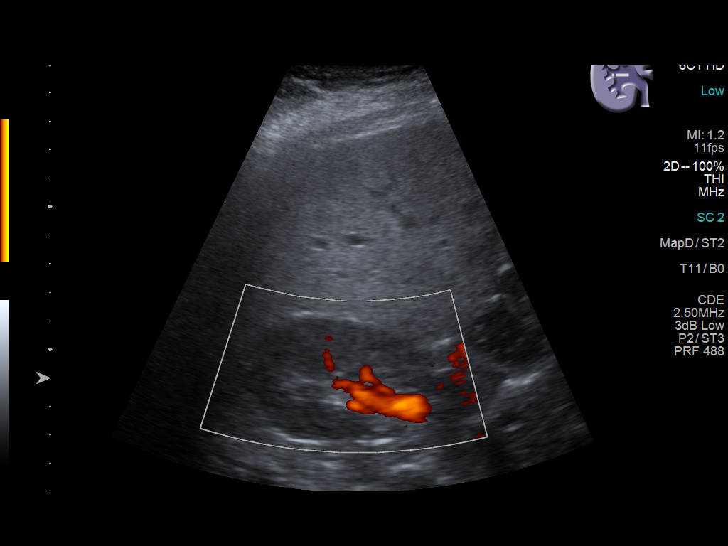
[im 15/39]
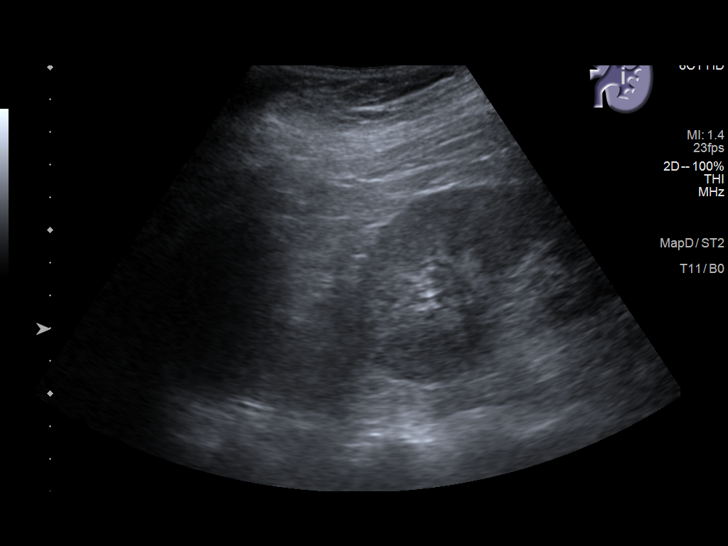
[im 18/39]
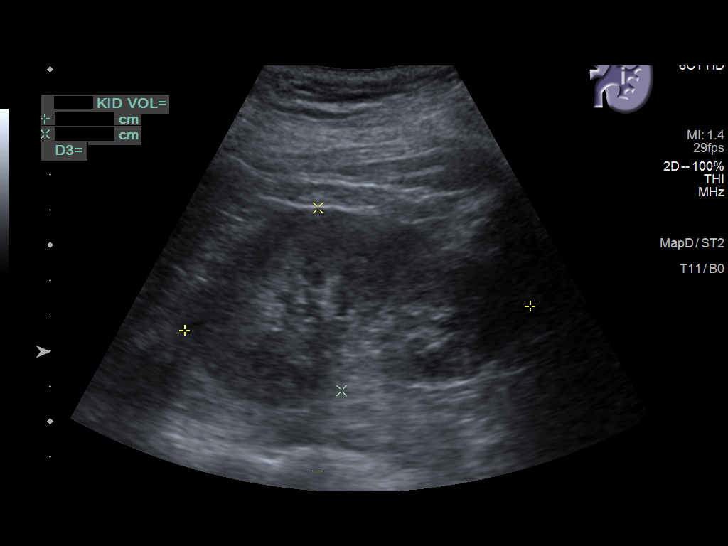
[im 21/39]
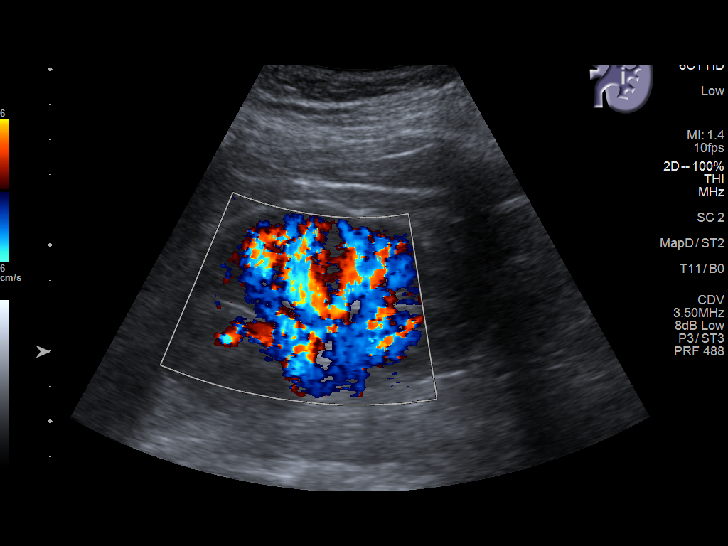
[im 24/39]
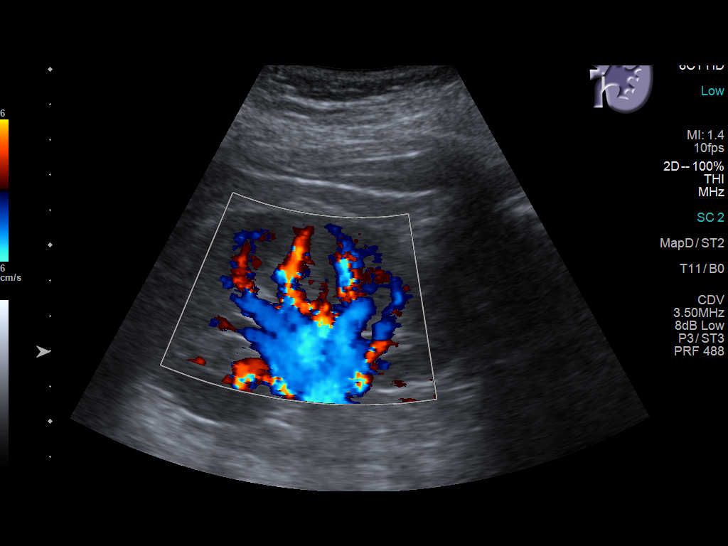
[im 26/39]
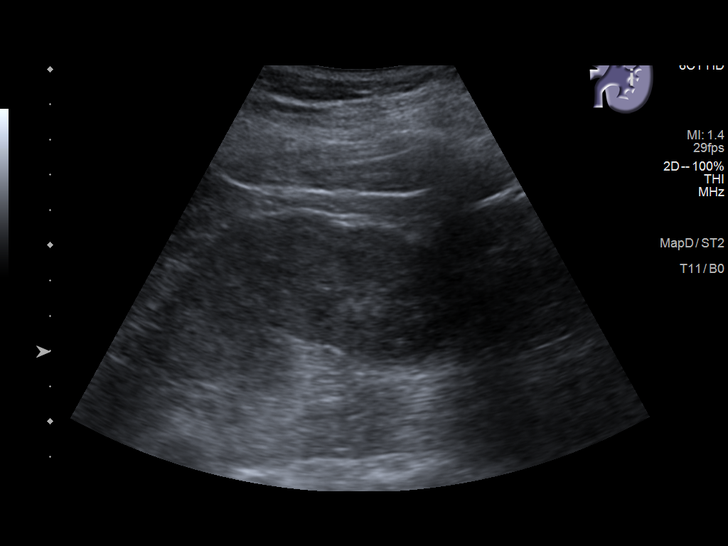
[im 29/39]
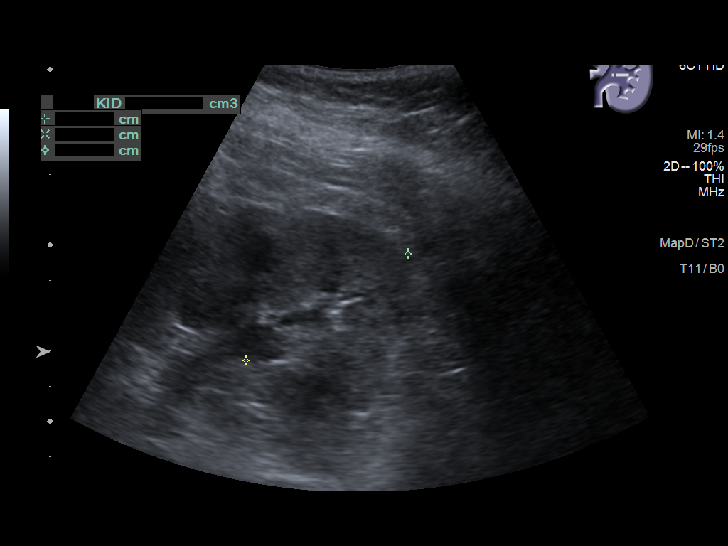
[im 32/39]
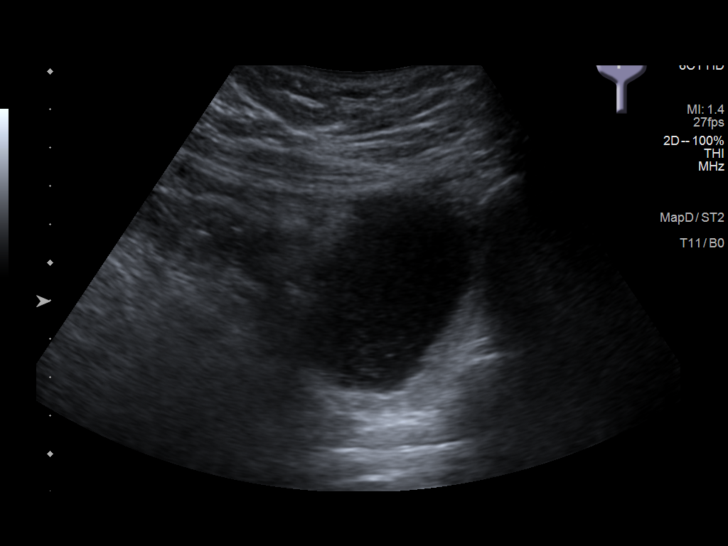
[im 35/39]
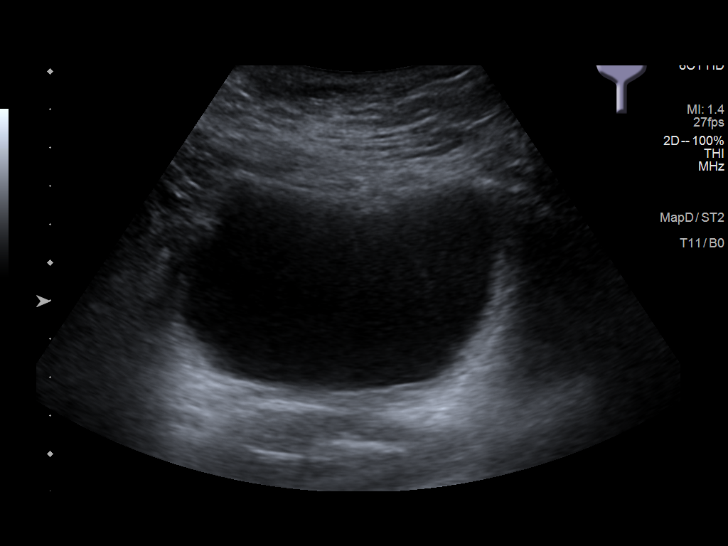
[im 39/39]
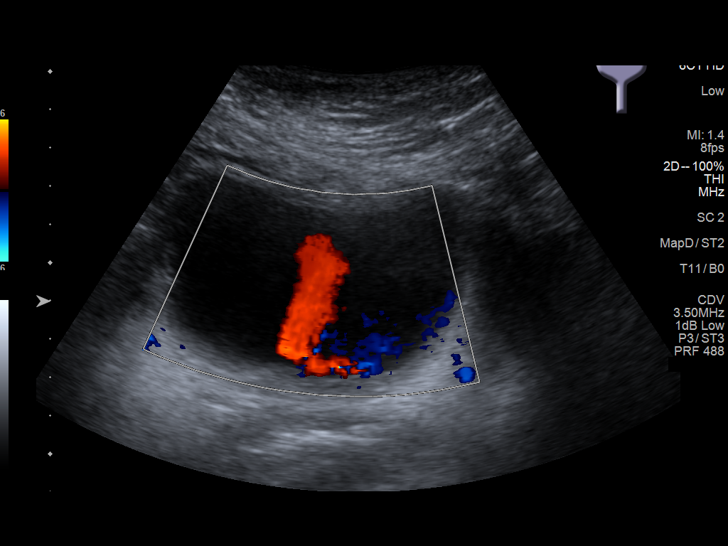

[14 of 25 positions shown; findings below may reference images not displayed]

FINDINGS: Right Kidney:

Renal measurements: 10.5 x 4.2 x 5.8 cm = volume: 135 mL.
Echogenicity within normal limits. No mass or hydronephrosis
visualized.

Left Kidney:

Renal measurements: 9.8 x 5.2 x 5.5 cm = volume: 148 mL.
Echogenicity within normal limits. No mass or hydronephrosis
visualized.

Bladder:

Appears normal for degree of bladder distention.

Other:

Diffusely increased hepatic echogenicity.
IMPRESSION: 1. Normal renal ultrasound.
2. Hepatic steatosis.

## 2022-07-01 DIAGNOSIS — I10 Essential (primary) hypertension: Secondary | ICD-10-CM | POA: Diagnosis not present

## 2022-07-01 DIAGNOSIS — R5383 Other fatigue: Secondary | ICD-10-CM | POA: Diagnosis not present

## 2022-07-01 DIAGNOSIS — Q8501 Neurofibromatosis, type 1: Secondary | ICD-10-CM | POA: Diagnosis not present

## 2022-07-01 DIAGNOSIS — Z0001 Encounter for general adult medical examination with abnormal findings: Secondary | ICD-10-CM | POA: Diagnosis not present

## 2022-07-01 DIAGNOSIS — E1165 Type 2 diabetes mellitus with hyperglycemia: Secondary | ICD-10-CM | POA: Diagnosis not present

## 2022-07-01 DIAGNOSIS — G6289 Other specified polyneuropathies: Secondary | ICD-10-CM | POA: Diagnosis not present

## 2022-07-01 DIAGNOSIS — E782 Mixed hyperlipidemia: Secondary | ICD-10-CM | POA: Diagnosis not present

## 2022-07-07 DIAGNOSIS — E119 Type 2 diabetes mellitus without complications: Secondary | ICD-10-CM | POA: Diagnosis not present

## 2022-07-07 DIAGNOSIS — G40209 Localization-related (focal) (partial) symptomatic epilepsy and epileptic syndromes with complex partial seizures, not intractable, without status epilepticus: Secondary | ICD-10-CM | POA: Diagnosis not present

## 2022-07-07 DIAGNOSIS — Q8500 Neurofibromatosis, unspecified: Secondary | ICD-10-CM | POA: Diagnosis not present

## 2022-07-07 DIAGNOSIS — M25511 Pain in right shoulder: Secondary | ICD-10-CM | POA: Diagnosis not present

## 2022-07-07 DIAGNOSIS — G629 Polyneuropathy, unspecified: Secondary | ICD-10-CM | POA: Diagnosis not present

## 2022-07-07 DIAGNOSIS — R202 Paresthesia of skin: Secondary | ICD-10-CM | POA: Diagnosis not present

## 2022-07-07 DIAGNOSIS — G8929 Other chronic pain: Secondary | ICD-10-CM | POA: Diagnosis not present

## 2022-07-07 DIAGNOSIS — M542 Cervicalgia: Secondary | ICD-10-CM | POA: Diagnosis not present

## 2022-07-15 ENCOUNTER — Encounter (INDEPENDENT_AMBULATORY_CARE_PROVIDER_SITE_OTHER): Payer: Self-pay | Admitting: Vascular Surgery

## 2022-07-15 ENCOUNTER — Telehealth (INDEPENDENT_AMBULATORY_CARE_PROVIDER_SITE_OTHER): Payer: Self-pay

## 2022-07-15 ENCOUNTER — Ambulatory Visit (INDEPENDENT_AMBULATORY_CARE_PROVIDER_SITE_OTHER): Payer: Medicare HMO | Admitting: Vascular Surgery

## 2022-07-15 VITALS — BP 143/76 | HR 64 | Resp 16 | Ht 61.0 in | Wt 174.0 lb

## 2022-07-15 DIAGNOSIS — E119 Type 2 diabetes mellitus without complications: Secondary | ICD-10-CM | POA: Diagnosis not present

## 2022-07-15 DIAGNOSIS — Z794 Long term (current) use of insulin: Secondary | ICD-10-CM

## 2022-07-15 DIAGNOSIS — I6529 Occlusion and stenosis of unspecified carotid artery: Secondary | ICD-10-CM | POA: Insufficient documentation

## 2022-07-15 DIAGNOSIS — I10 Essential (primary) hypertension: Secondary | ICD-10-CM

## 2022-07-15 DIAGNOSIS — I6523 Occlusion and stenosis of bilateral carotid arteries: Secondary | ICD-10-CM

## 2022-07-15 DIAGNOSIS — E785 Hyperlipidemia, unspecified: Secondary | ICD-10-CM

## 2022-07-15 MED ORDER — CLOPIDOGREL BISULFATE 75 MG PO TABS: 75.0000 mg | ORAL_TABLET | Freq: Every day | ORAL | 6 refills | Status: DC

## 2022-07-15 MED ORDER — ASPIRIN 81 MG PO TBEC: 81.0000 mg | DELAYED_RELEASE_TABLET | Freq: Every day | ORAL | 2 refills | Status: DC

## 2022-07-15 NOTE — Assessment & Plan Note (Signed)
lipid control important in reducing the progression of atherosclerotic disease. Continue statin therapy  

## 2022-07-15 NOTE — Assessment & Plan Note (Signed)
blood pressure control important in reducing the progression of atherosclerotic disease. On appropriate oral medications.  

## 2022-07-15 NOTE — Assessment & Plan Note (Signed)
blood glucose control important in reducing the progression of atherosclerotic disease. Also, involved in wound healing. On appropriate medications.  

## 2022-07-15 NOTE — Assessment & Plan Note (Signed)
duplex of the carotid arteries which suggested a very high-grade right carotid artery stenosis and a large amount of plaque in the left carotid artery but with a significantly lesser degree of stenosis that was not felt to be hemodynamically significant.  The patient remains asymptomatic with respect to the carotid stenosis.  However, the patient has now progressed and has a lesion the is >70%.  Patient should undergo CT angiography of the carotid arteries to define the degree of stenosis of the internal carotid arteries bilaterally and the anatomic suitability for surgery vs. intervention.  If the patient does indeed need surgery cardiac clearance will be required, once cleared the patient will be scheduled for surgery.  The risks, benefits and alternative therapies were reviewed in detail with the patient.  All questions were answered.  The patient agrees to proceed with imaging.  Continue antiplatelet therapy as prescribed today. Continue management of CAD, HTN and Hyperlipidemia. Healthy heart diet, encouraged exercise at least 4 times per week.

## 2022-07-15 NOTE — Telephone Encounter (Signed)
This lady will need contrast allergy prophylaxis called into her pharmacy once we know when her CT scan is schedule per Dr Lucky Cowboy

## 2022-07-15 NOTE — Progress Notes (Signed)
Patient ID: Laura Camacho, female   DOB: 1961/04/05, 61 y.o.   MRN: [id number]  Chief Complaint  Patient presents with   New Patient (Initial Visit)    Ref Melrose Nakayama consult abnormal carotid u/s    HPI Laura Camacho is a 61 y.o. female.  I am asked to see the patient by Dr. Melrose Nakayama for evaluation of carotid stenosis.  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  She is also had multiple procedures on her left eye.  A few months ago, her eye doctor ordered a duplex of the carotid arteries which suggested a very high-grade right carotid artery stenosis and a large amount of plaque in the left carotid artery but with a significantly lesser degree of stenosis that was not felt to be hemodynamically significant.  She is on a statin agent.  She is not on anticoagulation or antiplatelet therapy.    Past Medical History:  Diagnosis Date   Complication of anesthesia    STATES WAS TOLD NOT TO TAKE UNKNOWN ANESTHESIA AFTER NECKFUSION OR HYSTERECTOMY   Diabetes mellitus without complication (HCC)    Epilepsy ([place])    Fibromyalgia    GERD (gastroesophageal reflux disease)    Glaucoma (increased eye pressure)    History of Glaucoma but no drops   Headache    Hyperlipidemia    Hypertension    Neurofibromatosis ([place])    Pyelonephritis     Past Surgical History:  Procedure Laterality Date   ABDOMINAL HYSTERECTOMY     has both ovaries- [facility]   Bilateral Arm Surgery Bilateral    'tumors' removed   BREAST BIOPSY Left 11/05/2018   [place] core venus clip  BIOPSY: GRANULAR CELL TUMOR, 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   BREAST BIOPSY Left 11/05/2018   [place] core axilla hydro marker  NEGATIVE FOR METASTATIC CARCINOMA   CERVICAL FUSION     COLON SURGERY     COLONOSCOPY N/A 07/27/2020   Procedure: COLONOSCOPY;  Surgeon: Lesly Rubenstein, MD;  Location: [facility] ENDOSCOPY;  Service: Endoscopy;  Laterality: N/A;   COLONOSCOPY WITH PROPOFOL N/A  02/12/2016   Procedure: COLONOSCOPY WITH PROPOFOL;  Surgeon: Josefine Class, MD;  Location: [facility] ENDOSCOPY;  Service: Endoscopy;  Laterality: N/A;   COLONOSCOPY WITH PROPOFOL N/A 06/30/2020   Procedure: COLONOSCOPY WITH PROPOFOL;  Surgeon: Lesly Rubenstein, MD;  Location: [facility] ENDOSCOPY;  Service: Endoscopy;  Laterality: N/A;   ESOPHAGOGASTRODUODENOSCOPY (EGD) WITH PROPOFOL N/A 02/12/2016   Procedure: ESOPHAGOGASTRODUODENOSCOPY (EGD) WITH PROPOFOL;  Surgeon: Josefine Class, MD;  Location[facility] ENDOSCOPY;  Service: Endoscopy;  Laterality: N/A;   ESOPHAGOGASTRODUODENOSCOPY (EGD) WITH PROPOFOL N/A 06/30/2020   Procedure: ESOPHAGOGASTRODUODENOSCOPY (EGD) WITH PROPOFOL;  Surgeon: Lesly Rubenstein, MD;  Location: [facility] ENDOSCOPY;  Service: Endoscopy;  Laterality: N/A;   VULVECTOMY N/A 06/26/2017   Procedure: WIDE EXCISION VULVECTOMY;  Surgeon: Brayton Mars, MD;  Location: [facility] ORS;  Service: Gynecology;  Laterality: N/A;    Family History  Problem Relation Age of Onset   Diabetes Mother    Diabetes Father    Diabetes Maternal Aunt    Diabetes Maternal Uncle    Breast cancer Cousin    Ovarian cancer Neg Hx    Colon cancer Neg Hx      Social History   Tobacco Use   Smoking status: Every Day    Packs/day: 0.50    Types: Cigarettes   Smokeless tobacco: Never   Tobacco comments:  0.5 ppd 06/09/2021  Vaping Use   Vaping Use: Never used  Substance Use Topics   Alcohol use: Yes    Alcohol/week: 3.0 - 4.0 standard drinks of alcohol    Types: 3 - 4 Shots of liquor per week    Comment: once a week   Drug use: Not Currently    Types: Marijuana    Comment: monthly     Allergies  Allergen Reactions   Codeine Anaphylaxis    "Throat swells up"   Bupropion     Unknown   Cefuroxime Rash   Ciprofloxacin Rash   Fluconazole Itching   Ibuprofen Itching   Iodine Hives   Ivp Dye [Iodinated Contrast Media] Hives   Metformin And Related Itching   Nizoral [Ketoconazole]  Other (See Comments)    Dizzy/ Fainting   Sulfa Antibiotics Hives   Voltaren [Diclofenac Sodium] Diarrhea and Nausea And Vomiting    Current Outpatient Medications  Medication Sig Dispense Refill   amLODipine (NORVASC) 5 MG tablet Take 5 mg by mouth daily.     aspirin EC 81 MG tablet Take 1 tablet (81 mg total) by mouth daily. 150 tablet 2   atorvastatin (LIPITOR) 80 MG tablet Take 80 mg by mouth daily.     Blood Glucose Calibration (TRUE METRIX LEVEL 1) Low SOLN      Budeson-Glycopyrrol-Formoterol (BREZTRI AEROSPHERE) 160-9-4.8 MCG/ACT AERO Inhale 2 puffs into the lungs in the morning and at bedtime. 5.9 g 0   chlorthalidone (HYGROTON) 25 MG tablet Take by mouth.     clopidogrel (PLAVIX) 75 MG tablet Take 1 tablet (75 mg total) by mouth daily. 30 tablet 6   dapagliflozin propanediol (FARXIGA) 10 MG TABS tablet Take 10 mg by mouth daily.     dexlansoprazole (DEXILANT) 60 MG capsule Take 60 mg by mouth daily.     diltiazem (DILACOR XR) 180 MG 24 hr capsule Take 360 mg by mouth at bedtime.     Docusate Calcium (STOOL SOFTENER PO) Take 1 capsule by mouth daily.     dorzolamide-timolol (COSOPT) 22.3-6.8 MG/ML ophthalmic solution Place 1 drop into both eyes 2 (two) times daily.     DULoxetine (CYMBALTA) 60 MG capsule Take 1 capsule (60 mg total) by mouth daily. 90 capsule 3   ergocalciferol (VITAMIN D2) 1.25 MG (50000 UT) capsule Take 50,000 Units by mouth once a week.     famotidine (PEPCID) 40 MG tablet Take 40 mg by mouth daily.     fluticasone (FLONASE) 50 MCG/ACT nasal spray Place 1 spray into both nostrils daily as needed for allergies or rhinitis.     gabapentin (NEURONTIN) 300 MG capsule Take 300-600 mg by mouth at bedtime as needed (pain-pt takes 1-2 capsules at bedtime PRN).      LANTUS SOLOSTAR 100 UNIT/ML Solostar Pen Inject 35 Units into the skin at bedtime.  3   losartan (COZAAR) 100 MG tablet Take 100 mg by mouth daily.     Nebivolol HCl 20 MG TABS Take 20 mg by mouth daily.       omeprazole (PRILOSEC) 40 MG capsule Take 40 mg by mouth at bedtime.     phenazopyridine (PYRIDIUM) 100 MG tablet Take 1 tablet (100 mg total) by mouth 3 (three) times daily as needed for pain. 20 tablet 0   Suvorexant (BELSOMRA) 10 MG TABS Take 10 mg by mouth at bedtime as needed.     traZODone (DESYREL) 50 MG tablet Take 50 mg by mouth at bedtime.  No current facility-administered medications for this visit.      REVIEW OF SYSTEMS (Negative unless checked)  Constitutional: '[]'$ Weight loss  '[]'$ Fever  '[]'$ Chills Cardiac: '[]'$ Chest pain   '[]'$ Chest pressure   '[]'$ Palpitations   '[]'$ Shortness of breath when laying flat   '[]'$ Shortness of breath at rest   '[]'$ Shortness of breath with exertion. Vascular:  '[]'$ Pain in legs with walking   '[]'$ Pain in legs at rest   '[]'$ Pain in legs when laying flat   '[]'$ Claudication   '[]'$ Pain in feet when walking  '[]'$ Pain in feet at rest  '[]'$ Pain in feet when laying flat   '[]'$ History of DVT   '[]'$ Phlebitis   '[]'$ Swelling in legs   '[]'$ Varicose veins   '[]'$ Non-healing ulcers Pulmonary:   '[]'$ Uses home oxygen   '[]'$ Productive cough   '[]'$ Hemoptysis   '[]'$ Wheeze  '[]'$ COPD   '[]'$ Asthma Neurologic:  '[]'$ Dizziness  '[]'$ Blackouts   '[x]'$ Seizures   '[]'$ History of stroke   '[]'$ History of TIA  '[]'$ Aphasia   '[x]'$ Temporary blindness   '[]'$ Dysphagia   '[]'$ Weakness or numbness in arms   '[]'$ Weakness or numbness in legs Musculoskeletal:  '[x]'$ Arthritis   '[]'$ Joint swelling   '[]'$ Joint pain   '[]'$ Low back pain Hematologic:  '[]'$ Easy bruising  '[]'$ Easy bleeding   '[]'$ Hypercoagulable state   '[]'$ Anemic  '[]'$ Hepatitis Gastrointestinal:  '[]'$ Blood in stool   '[]'$ Vomiting blood  '[x]'$ Gastroesophageal reflux/heartburn   '[]'$ Abdominal pain Genitourinary:  '[]'$ Chronic kidney disease   '[]'$ Difficult urination  '[]'$ Frequent urination  '[]'$ Burning with urination   '[]'$ Hematuria Skin:  '[]'$ Rashes   '[]'$ Ulcers   '[]'$ Wounds Psychological:  '[]'$ History of anxiety   '[]'$  History of major depression.    Physical Exam BP (!) 143/76 (BP Location: Left Arm)   Pulse 64   Resp 16   Ht '5\' 1"'$  (1.549 m)    Wt 174 lb (78.9 kg)   BMI 32.88 kg/m  Gen:  WD/WN, NAD Head: [place]/AT, No temporalis wasting.  Ear/Nose/Throat: Hearing grossly intact, nares w/o erythema or drainage, oropharynx w/o Erythema/Exudate Eyes: Conjunctiva clear, sclera non-icteric  Neck: trachea midline.  Bilateral Bruits Pulmonary:  Good air movement, clear to auscultation bilaterally.  Cardiac: RRR, no JVD Vascular:  Vessel Right Left  Radial Palpable Palpable                                   Gastrointestinal: soft, non-tender/non-distended.  Musculoskeletal: M/S 5/5 throughout.  Extremities without ischemic changes.  No deformity or atrophy. No edema. Neurologic: Sensation grossly intact in extremities.  Symmetrical.  Speech is fluent. Motor exam as listed above. Psychiatric: Judgment intact, Mood & affect appropriate for pt's clinical situation. Dermatologic: No rashes or ulcers noted.  No cellulitis or open wounds.    Radiology No results found.  Labs No results found for this or any previous visit (from the past 2160 hour(s)).  Assessment/Plan:  Carotid stenosis duplex of the carotid arteries which suggested a very high-grade right carotid artery stenosis and a large amount of plaque in the left carotid artery but with a significantly lesser degree of stenosis that was not felt to be hemodynamically significant.  The patient remains asymptomatic with respect to the carotid stenosis.  However, the patient has now progressed and has a lesion the is >70%.  Patient should undergo CT angiography of the carotid arteries to define the degree of stenosis of the internal carotid arteries bilaterally and the anatomic suitability for surgery vs. intervention.  If the patient does indeed need surgery cardiac clearance  will be required, once cleared the patient will be scheduled for surgery.  The risks, benefits and alternative therapies were reviewed in detail with the patient.  All questions were answered.  The  patient agrees to proceed with imaging.  Continue antiplatelet therapy as prescribed today. Continue management of CAD, HTN and Hyperlipidemia. Healthy heart diet, encouraged exercise at least 4 times per week.    Hypertension blood pressure control important in reducing the progression of atherosclerotic disease. On appropriate oral medications.   Insulin dependent type 2 diabetes mellitus (HCC) blood glucose control important in reducing the progression of atherosclerotic disease. Also, involved in wound healing. On appropriate medications.   Hyperlipidemia lipid control important in reducing the progression of atherosclerotic disease. Continue statin therapy      Leotis Pain 07/15/2022, 1:14 PM   This note was created with Dragon medical transcription system.  Any errors from dictation are unintentional.

## 2022-07-18 ENCOUNTER — Telehealth (INDEPENDENT_AMBULATORY_CARE_PROVIDER_SITE_OTHER): Payer: Self-pay | Admitting: Vascular Surgery

## 2022-07-18 NOTE — Telephone Encounter (Signed)
LVM for pt advising of the prior auth was approved and she may call [phone] and schedule the CT that Dr. Lucky Cowboy ordered. After making the CT appt, I asked pt to call us back at the office and schedule a CT results appt with Dr. Lucky Cowboy. Nothing further is needed at thsi time.

## 2022-07-19 ENCOUNTER — Telehealth (INDEPENDENT_AMBULATORY_CARE_PROVIDER_SITE_OTHER): Payer: Self-pay | Admitting: Vascular Surgery

## 2022-07-19 NOTE — Telephone Encounter (Signed)
Patient is scheduled for her CT on 9.14.23 @ 2:45pm.

## 2022-07-19 NOTE — Telephone Encounter (Signed)
Contrast protocol for CT has been called into [facility]

## 2022-07-19 NOTE — Telephone Encounter (Signed)
LVM for pt TCB and schedule CT results appt with Dr. Lucky Cowboy.

## 2022-07-21 ENCOUNTER — Ambulatory Visit: Admission: RE | Admit: 2022-07-21 | Payer: Medicaid Other | Source: Ambulatory Visit

## 2022-07-27 ENCOUNTER — Ambulatory Visit: Admission: RE | Admit: 2022-07-27 | Payer: Medicare HMO | Source: Ambulatory Visit

## 2022-08-11 ENCOUNTER — Ambulatory Visit
Admission: RE | Admit: 2022-08-11 | Discharge: 2022-08-11 | Disposition: A | Discharge status: Home / Self Care | Payer: Medicare HMO | Source: Ambulatory Visit | Attending: Vascular Surgery | Admitting: Vascular Surgery

## 2022-08-11 DIAGNOSIS — I6523 Occlusion and stenosis of bilateral carotid arteries: Secondary | ICD-10-CM | POA: Diagnosis not present

## 2022-08-11 DIAGNOSIS — H547 Unspecified visual loss: Secondary | ICD-10-CM | POA: Diagnosis not present

## 2022-08-11 DIAGNOSIS — I672 Cerebral atherosclerosis: Secondary | ICD-10-CM | POA: Diagnosis not present

## 2022-08-11 DIAGNOSIS — Z981 Arthrodesis status: Secondary | ICD-10-CM | POA: Diagnosis not present

## 2022-08-11 LAB — POCT I-STAT CREATININE: Creatinine, Ser: 1 mg/dL (ref 0.44–1.00)

## 2022-08-11 MED ORDER — IOHEXOL 350 MG/ML SOLN
75.0000 mL | Freq: Once | INTRAVENOUS | Status: AC | PRN
  Administered 2022-08-11: 75 mL via INTRAVENOUS

## 2022-10-11 DIAGNOSIS — I10 Essential (primary) hypertension: Secondary | ICD-10-CM | POA: Diagnosis not present

## 2022-10-11 DIAGNOSIS — E1165 Type 2 diabetes mellitus with hyperglycemia: Secondary | ICD-10-CM | POA: Diagnosis not present

## 2022-10-11 DIAGNOSIS — E782 Mixed hyperlipidemia: Secondary | ICD-10-CM | POA: Diagnosis not present

## 2022-10-11 DIAGNOSIS — R5383 Other fatigue: Secondary | ICD-10-CM | POA: Diagnosis not present

## 2023-01-17 ENCOUNTER — Ambulatory Visit: Payer: Medicaid Other | Admitting: Nurse Practitioner

## 2023-01-23 ENCOUNTER — Ambulatory Visit: Payer: Medicare HMO | Admitting: Nurse Practitioner

## 2023-01-26 ENCOUNTER — Ambulatory Visit: Payer: Medicaid Other | Admitting: Nurse Practitioner

## 2023-01-31 ENCOUNTER — Ambulatory Visit (INDEPENDENT_AMBULATORY_CARE_PROVIDER_SITE_OTHER): Payer: Medicaid Other | Admitting: Nurse Practitioner

## 2023-01-31 VITALS — BP 130/80 | HR 47 | Ht 61.0 in | Wt 188.8 lb

## 2023-01-31 DIAGNOSIS — E119 Type 2 diabetes mellitus without complications: Secondary | ICD-10-CM | POA: Diagnosis not present

## 2023-01-31 DIAGNOSIS — E782 Mixed hyperlipidemia: Secondary | ICD-10-CM

## 2023-01-31 DIAGNOSIS — I10 Essential (primary) hypertension: Secondary | ICD-10-CM

## 2023-01-31 DIAGNOSIS — K219 Gastro-esophageal reflux disease without esophagitis: Secondary | ICD-10-CM | POA: Diagnosis not present

## 2023-01-31 DIAGNOSIS — Q8500 Neurofibromatosis, unspecified: Secondary | ICD-10-CM | POA: Diagnosis not present

## 2023-01-31 NOTE — Progress Notes (Signed)
Established Patient Office Visit  Subjective:  Patient ID: Laura Camacho, female    DOB: 1961/01/10  Age: 62 y.o. MRN: [id number]  Chief Complaint  Patient presents with   Follow-up    Follow up    Lab results follow up and fill out PCS aide paperwork.    No other concerns at this time.   Past Medical History:  Diagnosis Date   Complication of anesthesia    STATES WAS TOLD NOT TO TAKE UNKNOWN ANESTHESIA AFTER NECKFUSION OR HYSTERECTOMY   Diabetes mellitus without complication (HCC)    Epilepsy ([place])    Fibromyalgia    GERD (gastroesophageal reflux disease)    Glaucoma (increased eye pressure)    History of Glaucoma but no drops   Headache    Hyperlipidemia    Hypertension    Neurofibromatosis ([place])    Pyelonephritis     Past Surgical History:  Procedure Laterality Date   ABDOMINAL HYSTERECTOMY     has both ovaries- [facility]   Bilateral Arm Surgery Bilateral    'tumors' removed   BREAST BIOPSY Left 11/05/2018   [place] core venus clip  BIOPSY: GRANULAR CELL TUMOR, 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   BREAST BIOPSY Left 11/05/2018   [place] core axilla hydro marker  NEGATIVE FOR METASTATIC CARCINOMA   CERVICAL FUSION     COLON SURGERY     COLONOSCOPY N/A 07/27/2020   Procedure: COLONOSCOPY;  Surgeon: Lesly Rubenstein, MD;  Location: [facility] ENDOSCOPY;  Service: Endoscopy;  Laterality: N/A;   COLONOSCOPY WITH PROPOFOL N/A 02/12/2016   Procedure: COLONOSCOPY WITH PROPOFOL;  Surgeon: Josefine Class, MD;  Location: [facility] ENDOSCOPY;  Service: Endoscopy;  Laterality: N/A;   COLONOSCOPY WITH PROPOFOL N/A 06/30/2020   Procedure: COLONOSCOPY WITH PROPOFOL;  Surgeon: Lesly Rubenstein, MD;  Location: [facility] ENDOSCOPY;  Service: Endoscopy;  Laterality: N/A;   ESOPHAGOGASTRODUODENOSCOPY (EGD) WITH PROPOFOL N/A 02/12/2016   Procedure: ESOPHAGOGASTRODUODENOSCOPY (EGD) WITH PROPOFOL;  Surgeon: Josefine Class, MD;  Location: [facility] ENDOSCOPY;  Service: Endoscopy;  Laterality: N/A;    ESOPHAGOGASTRODUODENOSCOPY (EGD) WITH PROPOFOL N/A 06/30/2020   Procedure: ESOPHAGOGASTRODUODENOSCOPY (EGD) WITH PROPOFOL;  Surgeon: Lesly Rubenstein, MD;  Location: [facility] ENDOSCOPY;  Service: Endoscopy;  Laterality: N/A;   VULVECTOMY N/A 06/26/2017   Procedure: WIDE EXCISION VULVECTOMY;  Surgeon: Brayton Mars, MD;  Location: [facility] ORS;  Service: Gynecology;  Laterality: N/A;    Social History   Socioeconomic History   Marital status: Single    Spouse name: Not on file   Number of children: Not on file   Years of education: Not on file   Highest education level: Not on file  Occupational History   Not on file  Tobacco Use   Smoking status: Every Day    Packs/day: .5    Types: Cigarettes   Smokeless tobacco: Never   Tobacco comments:    0.5 ppd 06/09/2021  Vaping Use   Vaping Use: Never used  Substance and Sexual Activity   Alcohol use: Yes    Alcohol/week: 3.0 - 4.0 standard drinks of alcohol    Types: 3 - 4 Shots of liquor per week    Comment: once a week   Drug use: Not Currently    Types: Marijuana    Comment: monthly   Sexual activity: Yes    Birth control/protection: Surgical  Other Topics Concern   Not on file  Social History Narrative   Not on file   Social Determinants of Radio broadcast assistant  Strain: Not on file  Food Insecurity: Not on file  Transportation Needs: Not on file  Physical Activity: Not on file  Stress: Not on file  Social Connections: Not on file  Intimate Partner Violence: Not on file    Family History  Problem Relation Age of Onset   Diabetes Mother    Diabetes Father    Diabetes Maternal Aunt    Diabetes Maternal Uncle    Breast cancer Cousin    Ovarian cancer Neg Hx    Colon cancer Neg Hx     Allergies  Allergen Reactions   Codeine Anaphylaxis    "Throat swells up"   Bupropion     Unknown   Cefuroxime Rash   Ciprofloxacin Rash   Fluconazole Itching   Ibuprofen Itching   Iodine Hives   Ivp Dye [Iodinated  Contrast Media] Hives   Metformin And Related Itching   Nizoral [Ketoconazole] Other (See Comments)    Dizzy/ Fainting   Sulfa Antibiotics Hives   Voltaren [Diclofenac Sodium] Diarrhea and Nausea And Vomiting    Review of Systems  Constitutional: Negative.   HENT: Negative.    Eyes: Negative.   Respiratory: Negative.    Cardiovascular: Negative.   Gastrointestinal: Negative.   Genitourinary: Negative.   Musculoskeletal:  Positive for myalgias.  Skin: Negative.   Neurological: Negative.   Endo/Heme/Allergies: Negative.   Psychiatric/Behavioral: Negative.         Objective:   BP 130/80   Pulse (!) 47   Ht 5\' 1"  (1.549 m)   Wt 188 lb 12.8 oz (85.6 kg)   SpO2 97%   BMI 35.67 kg/m   Vitals:   01/31/23 1419  BP: 130/80  Pulse: (!) 47  Height: 5\' 1"  (1.549 m)  Weight: 188 lb 12.8 oz (85.6 kg)  SpO2: 97%  BMI (Calculated): 35.69    Physical Exam Vitals reviewed.  Constitutional:      Appearance: Normal appearance.  HENT:     Head: Normocephalic.     Nose: Nose normal.     Mouth/Throat:     Mouth: Mucous membranes are moist.  Eyes:     Pupils: Pupils are equal, round, and reactive to light.  Cardiovascular:     Rate and Rhythm: Normal rate and regular rhythm.  Pulmonary:     Effort: Pulmonary effort is normal.     Breath sounds: Normal breath sounds.  Abdominal:     General: Bowel sounds are normal.     Palpations: Abdomen is soft.  Musculoskeletal:     Cervical back: Normal range of motion and neck supple.  Skin:    General: Skin is warm and dry.  Neurological:     Mental Status: She is alert and oriented to person, place, and time.  Psychiatric:        Mood and Affect: Mood normal.        Behavior: Behavior normal.      No results found for any visits on 01/31/23.  No results found for this or any previous visit (from the past 2160 hour(s)).    Assessment & Plan:   Problem List Items Addressed This Visit       Cardiovascular and  Mediastinum   Hypertension - Primary   Relevant Orders   TSH   CMP14+EGFR     Digestive   GERD (gastroesophageal reflux disease)     Nervous and Auditory   Neurofibromatosis ([place])     Other   Hyperlipidemia   Relevant Orders  Lipid panel   Other Visit Diagnoses     Diabetes mellitus without complication ([place])       Relevant Orders   Hemoglobin A1c       Return in about 3 months (around 05/03/2023).   Total time spent: 40 minutes  Evern Bio, NP  01/31/2023

## 2023-01-31 NOTE — Patient Instructions (Signed)
1) Will complete new PCS aide paperwork 2) Follow up appt in

## 2023-02-01 LAB — LIPID PANEL
Chol/HDL Ratio: 4.5 ratio — ABNORMAL HIGH (ref 0.0–4.4)
Cholesterol, Total: 154 mg/dL (ref 100–199)
HDL: 34 mg/dL — ABNORMAL LOW (ref >39)
LDL Chol Calc (NIH): 96 mg/dL (ref 0–99)
Triglycerides: 133 mg/dL (ref 0–149)
VLDL Cholesterol Cal: 24 mg/dL (ref 5–40)

## 2023-02-01 LAB — CMP14+EGFR
ALT: 12 IU/L (ref 0–32)
AST: 14 IU/L (ref 0–40)
Albumin/Globulin Ratio: 1.5 (ref 1.2–2.2)
Albumin: 4.2 g/dL (ref 3.9–4.9)
Alkaline Phosphatase: 76 IU/L (ref 44–121)
BUN/Creatinine Ratio: 12 (ref 12–28)
BUN: 16 mg/dL (ref 8–27)
Bilirubin Total: <0.2 mg/dL (ref 0.0–1.2)
CO2: 22 mmol/L (ref 20–29)
Calcium: 9.6 mg/dL (ref 8.7–10.3)
Chloride: 105 mmol/L (ref 96–106)
Creatinine, Ser: 1.33 mg/dL — ABNORMAL HIGH (ref 0.57–1.00)
Globulin, Total: 2.8 g/dL (ref 1.5–4.5)
Glucose: 108 mg/dL — ABNORMAL HIGH (ref 70–99)
Potassium: 4.6 mmol/L (ref 3.5–5.2)
Sodium: 141 mmol/L (ref 134–144)
Total Protein: 7 g/dL (ref 6.0–8.5)
eGFR: 45 mL/min/{1.73_m2} — ABNORMAL LOW (ref >59)

## 2023-02-01 LAB — HEMOGLOBIN A1C
Est. average glucose Bld gHb Est-mCnc: 128 mg/dL
Hgb A1c MFr Bld: 6.1 % — ABNORMAL HIGH (ref 4.8–5.6)

## 2023-02-01 LAB — TSH: TSH: 1.45 u[IU]/mL (ref 0.450–4.500)

## 2023-03-15 ENCOUNTER — Telehealth: Payer: Self-pay

## 2023-03-15 DIAGNOSIS — H40021 Open angle with borderline findings, high risk, right eye: Secondary | ICD-10-CM | POA: Diagnosis not present

## 2023-03-15 NOTE — Telephone Encounter (Signed)
We need to call patient and find out if patient getting her rxs from [facility] order or from the local [facility] pharmacy,   [facility] is requesting  lantus solostar, ozempic 2mg /50ml, prodigy test strips, 28g lancets, semglee insulin glargine 100un/ml

## 2023-03-23 ENCOUNTER — Ambulatory Visit: Payer: Medicaid Other | Admitting: Nurse Practitioner

## 2023-03-27 ENCOUNTER — Other Ambulatory Visit: Payer: Self-pay | Admitting: Internal Medicine

## 2023-03-27 ENCOUNTER — Telehealth: Payer: Self-pay | Admitting: *Deleted

## 2023-03-27 DIAGNOSIS — E119 Type 2 diabetes mellitus without complications: Secondary | ICD-10-CM

## 2023-03-27 MED ORDER — LANTUS SOLOSTAR 100 UNIT/ML ~~LOC~~ SOPN: 35.0000 [IU] | PEN_INJECTOR | Freq: Every day | SUBCUTANEOUS | 3 refills | Status: DC

## 2023-03-27 MED ORDER — PRODIGY NO CODING BLOOD GLUC VI STRP: ORAL_STRIP | 2 refills | Status: AC

## 2023-03-27 NOTE — Telephone Encounter (Signed)
[facility] called needing the following Rx sent in for patient:  Lantus solostar pen 100 unit/mL Ozempic 0.25 or 0.5 mg pen Prodigy no coding test strips  [facility]

## 2023-04-10 ENCOUNTER — Other Ambulatory Visit: Payer: Self-pay | Admitting: Nurse Practitioner

## 2023-04-24 ENCOUNTER — Ambulatory Visit (INDEPENDENT_AMBULATORY_CARE_PROVIDER_SITE_OTHER): Payer: Medicaid Other | Admitting: Internal Medicine

## 2023-04-24 ENCOUNTER — Encounter: Payer: Self-pay | Admitting: Internal Medicine

## 2023-04-24 VITALS — BP 108/72 | HR 57 | Ht 61.0 in | Wt 191.2 lb

## 2023-04-24 DIAGNOSIS — I10 Essential (primary) hypertension: Secondary | ICD-10-CM

## 2023-04-24 DIAGNOSIS — E782 Mixed hyperlipidemia: Secondary | ICD-10-CM

## 2023-04-24 DIAGNOSIS — E785 Hyperlipidemia, unspecified: Secondary | ICD-10-CM | POA: Diagnosis not present

## 2023-04-24 DIAGNOSIS — K219 Gastro-esophageal reflux disease without esophagitis: Secondary | ICD-10-CM

## 2023-04-24 DIAGNOSIS — Z794 Long term (current) use of insulin: Secondary | ICD-10-CM

## 2023-04-24 DIAGNOSIS — E119 Type 2 diabetes mellitus without complications: Secondary | ICD-10-CM

## 2023-04-24 LAB — POCT CBG (FASTING - GLUCOSE)-MANUAL ENTRY: Glucose Fasting, POC: 139 mg/dL — AB (ref 70–99)

## 2023-04-24 NOTE — Progress Notes (Signed)
Established Patient Office Visit  Subjective:  Patient ID: Laura Camacho, female    DOB: 03-02-1961  Age: 62 y.o. MRN: [id number]  Chief Complaint  Patient presents with   Follow-up    Med reconciliation     Patient comes in for follow up accompanied by her grand daughter. 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 Left eye was already having problems. Not taking her meds as cannot see anything. Family unable to help. Will set up home health services asap, needs drug management.     No other concerns at this time.   Past Medical History:  Diagnosis Date   Complication of anesthesia    STATES WAS TOLD NOT TO TAKE UNKNOWN ANESTHESIA AFTER NECKFUSION OR HYSTERECTOMY   Diabetes mellitus without complication (HCC)    Epilepsy (HCC)    Fibromyalgia    GERD (gastroesophageal reflux disease)    Glaucoma (increased eye pressure)    History of Glaucoma but no drops   Headache    Hyperlipidemia    Hypertension    Neurofibromatosis (HCC)    Pyelonephritis     Past Surgical History:  Procedure Laterality Date   ABDOMINAL HYSTERECTOMY     has both ovaries- [facility]   Bilateral Arm Surgery Bilateral    'tumors' removed   BREAST BIOPSY Left 11/05/2018   [place] core venus clip  BIOPSY: GRANULAR CELL TUMOR, 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   BREAST BIOPSY Left 11/05/2018   [place] core axilla hydro marker  NEGATIVE FOR METASTATIC CARCINOMA   CERVICAL FUSION     COLON SURGERY     COLONOSCOPY N/A 07/27/2020   Procedure: COLONOSCOPY;  Surgeon: Regis Bill, MD;  Location: [facility] ENDOSCOPY;  Service: Endoscopy;  Laterality: N/A;   COLONOSCOPY WITH PROPOFOL N/A 02/12/2016   Procedure: COLONOSCOPY WITH PROPOFOL;  Surgeon: Elnita Maxwell, MD;  Location: [facility] ENDOSCOPY;  Service: Endoscopy;  Laterality: N/A;   COLONOSCOPY WITH PROPOFOL N/A 06/30/2020   Procedure: COLONOSCOPY WITH PROPOFOL;  Surgeon: Regis Bill, MD;  Location: [facility]  ENDOSCOPY;  Service: Endoscopy;  Laterality: N/A;   ESOPHAGOGASTRODUODENOSCOPY (EGD) WITH PROPOFOL N/A 02/12/2016   Procedure: ESOPHAGOGASTRODUODENOSCOPY (EGD) WITH PROPOFOL;  Surgeon: Elnita Maxwell, MD;  Location: [facility] ENDOSCOPY;  Service: Endoscopy;  Laterality: N/A;   ESOPHAGOGASTRODUODENOSCOPY (EGD) WITH PROPOFOL N/A 06/30/2020   Procedure: ESOPHAGOGASTRODUODENOSCOPY (EGD) WITH PROPOFOL;  Surgeon: Regis Bill, MD;  Location: [facility] ENDOSCOPY;  Service: Endoscopy;  Laterality: N/A;   VULVECTOMY N/A 06/26/2017   Procedure: WIDE EXCISION VULVECTOMY;  Surgeon: Herold Harms, MD;  Location: [facility] ORS;  Service: Gynecology;  Laterality: N/A;    Social History   Socioeconomic History   Marital status: Single    Spouse name: Not on file   Number of children: Not on file   Years of education: Not on file   Highest education level: Not on file  Occupational History   Not on file  Tobacco Use   Smoking status: Every Day    Packs/day: .5    Types: Cigarettes   Smokeless tobacco: Never   Tobacco comments:    0.5 ppd 06/09/2021  Vaping Use   Vaping Use: Never used  Substance and Sexual Activity   Alcohol use: Yes    Alcohol/week: 3.0 - 4.0 standard drinks of alcohol    Types: 3 - 4 Shots of liquor per week    Comment: once a week   Drug use: Not Currently    Types:  Marijuana    Comment: monthly   Sexual activity: Yes    Birth control/protection: Surgical  Other Topics Concern   Not on file  Social History Narrative   Not on file   Social Determinants of Health   Financial Resource Strain: Not on file  Food Insecurity: Not on file  Transportation Needs: Not on file  Physical Activity: Not on file  Stress: Not on file  Social Connections: Not on file  Intimate Partner Violence: Not on file    Family History  Problem Relation Age of Onset   Diabetes Mother    Diabetes Father    Diabetes Maternal Aunt    Diabetes Maternal Uncle    Breast cancer Cousin     Ovarian cancer Neg Hx    Colon cancer Neg Hx     Allergies  Allergen Reactions   Codeine Anaphylaxis    "Throat swells up"   Bupropion     Unknown   Cefuroxime Rash   Ciprofloxacin Rash   Fluconazole Itching   Ibuprofen Itching   Iodine Hives   Ivp Dye [Iodinated Contrast Media] Hives   Metformin And Related Itching   Nizoral [Ketoconazole] Other (See Comments)    Dizzy/ Fainting   Sulfa Antibiotics Hives   Voltaren [Diclofenac Sodium] Diarrhea and Nausea And Vomiting    Review of Systems  Constitutional:  Negative for chills, diaphoresis, fever, malaise/fatigue and weight loss.  HENT: Negative.  Negative for hearing loss.   Eyes:  Positive for blurred vision. Negative for discharge and redness.  Respiratory:  Negative for cough and shortness of breath.   Cardiovascular:  Negative for chest pain, palpitations, claudication, leg swelling and PND.  Gastrointestinal:  Negative for abdominal pain, constipation, diarrhea, heartburn, nausea and vomiting.  Genitourinary: Negative.   Musculoskeletal:  Positive for joint pain.  Psychiatric/Behavioral:  Negative for depression. The patient is not nervous/anxious.        Objective:   BP 108/72   Pulse (!) 57   Ht 5\' 1"  (1.549 m)   Wt 191 lb 3.2 oz (86.7 kg)   SpO2 98%   BMI 36.13 kg/m   Vitals:   04/24/23 1429  BP: 108/72  Pulse: (!) 57  Height: 5\' 1"  (1.549 m)  Weight: 191 lb 3.2 oz (86.7 kg)  SpO2: 98%  BMI (Calculated): 36.15    Physical Exam Vitals and nursing note reviewed.  Constitutional:      Appearance: Normal appearance.  Cardiovascular:     Rate and Rhythm: Normal rate.  Pulmonary:     Effort: Pulmonary effort is normal.     Breath sounds: Normal breath sounds. No wheezing or rhonchi.  Chest:     Chest wall: No tenderness.  Abdominal:     Palpations: Abdomen is soft.     Tenderness: There is no right CVA tenderness or left CVA tenderness.  Musculoskeletal:        General: No swelling or  tenderness.     Right lower leg: No edema.     Left lower leg: No edema.  Neurological:     General: No focal deficit present.     Mental Status: She is alert and oriented to person, place, and time.  Psychiatric:        Mood and Affect: Mood normal.     Results for orders placed or performed in visit on 04/24/23  POCT CBG (Fasting - Glucose)  Result Value Ref Range   Glucose Fasting, POC 139 (A) 70 - 99 mg/dL  Recent Results (from the past 2160 hour(s))  Hemoglobin A1c     Status: Abnormal   Collection Time: 01/31/23  2:57 PM  Result Value Ref Range   Hgb A1c MFr Bld 6.1 (H) 4.8 - 5.6 %    Comment:          Prediabetes: 5.7 - 6.4          Diabetes: >6.4          Glycemic control for adults with diabetes: <7.0    Est. average glucose Bld gHb Est-mCnc 128 mg/dL  TSH     Status: None   Collection Time: 01/31/23  2:57 PM  Result Value Ref Range   TSH 1.450 0.450 - 4.500 uIU/mL  CMP14+EGFR     Status: Abnormal   Collection Time: 01/31/23  2:57 PM  Result Value Ref Range   Glucose 108 (H) 70 - 99 mg/dL   BUN 16 8 - 27 mg/dL   Creatinine, Ser 1.61 (H) 0.57 - 1.00 mg/dL   eGFR 45 (L) >09 UE/AVW/0.98   BUN/Creatinine Ratio 12 12 - 28   Sodium 141 134 - 144 mmol/L   Potassium 4.6 3.5 - 5.2 mmol/L   Chloride 105 96 - 106 mmol/L   CO2 22 20 - 29 mmol/L   Calcium 9.6 8.7 - 10.3 mg/dL   Total Protein 7.0 6.0 - 8.5 g/dL   Albumin 4.2 3.9 - 4.9 g/dL   Globulin, Total 2.8 1.5 - 4.5 g/dL   Albumin/Globulin Ratio 1.5 1.2 - 2.2   Bilirubin Total <0.2 0.0 - 1.2 mg/dL   Alkaline Phosphatase 76 44 - 121 IU/L   AST 14 0 - 40 IU/L   ALT 12 0 - 32 IU/L  Lipid panel     Status: Abnormal   Collection Time: 01/31/23  2:57 PM  Result Value Ref Range   Cholesterol, Total 154 100 - 199 mg/dL   Triglycerides 119 0 - 149 mg/dL   HDL 34 (L) >14 mg/dL   VLDL Cholesterol Cal 24 5 - 40 mg/dL   LDL Chol Calc (NIH) 96 0 - 99 mg/dL   Chol/HDL Ratio 4.5 (H) 0.0 - 4.4 ratio    Comment:                                    T. Chol/HDL Ratio                                             Men  Women                               1/2 Avg.Risk  3.4    3.3                                   Avg.Risk  5.0    4.4                                2X Avg.Risk  9.6    7.1  3X Avg.Risk 23.4   11.0   POCT CBG (Fasting - Glucose)     Status: Abnormal   Collection Time: 04/24/23  2:35 PM  Result Value Ref Range   Glucose Fasting, POC 139 (A) 70 - 99 mg/dL      Assessment & Plan:  Urgently set up home health services. Will check labs at next visit. Problem List Items Addressed This Visit     Essential hypertension, benign   Hyperlipidemia   GERD (gastroesophageal reflux disease)   Dyslipidemia   Insulin dependent type 2 diabetes mellitus (HCC) - Primary   Relevant Orders   POCT CBG (Fasting - Glucose) (Completed)    Return in about 2 weeks (around 05/08/2023).   Total time spent: 25 minutes  Margaretann Loveless, MD  04/24/2023   This document may have been prepared by [facility] Voice Recognition software and as such may include unintentional dictation errors.

## 2023-05-08 ENCOUNTER — Ambulatory Visit: Payer: Medicaid Other | Admitting: Internal Medicine

## 2023-06-09 ENCOUNTER — Telehealth: Payer: Self-pay

## 2023-06-09 NOTE — Telephone Encounter (Signed)
Patient granddaughter called asking for call back states that the patient has been out of her meds for the last two months but didn't state which ones she was needing

## 2023-06-20 ENCOUNTER — Ambulatory Visit: Payer: Medicaid Other | Admitting: Cardiology

## 2023-07-18 ENCOUNTER — Other Ambulatory Visit: Payer: Self-pay

## 2023-07-18 DIAGNOSIS — Z794 Long term (current) use of insulin: Secondary | ICD-10-CM

## 2023-07-19 MED ORDER — DULOXETINE HCL 60 MG PO CPEP: 60.0000 mg | ORAL_CAPSULE | Freq: Every day | ORAL | 3 refills | Status: DC

## 2023-07-19 MED ORDER — OMEPRAZOLE 40 MG PO CPDR: 40.0000 mg | DELAYED_RELEASE_CAPSULE | Freq: Every day | ORAL | 1 refills | Status: DC

## 2023-07-19 MED ORDER — LANTUS SOLOSTAR 100 UNIT/ML ~~LOC~~ SOPN: 35.0000 [IU] | PEN_INJECTOR | Freq: Every day | SUBCUTANEOUS | 3 refills | Status: DC

## 2023-07-19 MED ORDER — DAPAGLIFLOZIN PROPANEDIOL 10 MG PO TABS: 10.0000 mg | ORAL_TABLET | Freq: Every day | ORAL | 2 refills | Status: DC

## 2023-07-19 MED ORDER — DEXLANSOPRAZOLE 60 MG PO CPDR: 60.0000 mg | DELAYED_RELEASE_CAPSULE | Freq: Every day | ORAL | 2 refills | Status: DC

## 2023-07-19 MED ORDER — ERGOCALCIFEROL 1.25 MG (50000 UT) PO CAPS: 50000.0000 [IU] | ORAL_CAPSULE | ORAL | 1 refills | Status: DC

## 2023-07-19 MED ORDER — GABAPENTIN 300 MG PO CAPS: 300.0000 mg | ORAL_CAPSULE | Freq: Every evening | ORAL | 1 refills | Status: DC | PRN

## 2023-07-19 MED ORDER — FAMOTIDINE 40 MG PO TABS: 40.0000 mg | ORAL_TABLET | Freq: Every day | ORAL | 1 refills | Status: DC

## 2023-07-19 MED ORDER — AMLODIPINE BESYLATE 5 MG PO TABS: 5.0000 mg | ORAL_TABLET | Freq: Every day | ORAL | 1 refills | Status: DC

## 2023-07-19 MED ORDER — ATORVASTATIN CALCIUM 80 MG PO TABS: 80.0000 mg | ORAL_TABLET | Freq: Every day | ORAL | 1 refills | Status: DC

## 2023-07-19 MED ORDER — TRAZODONE HCL 50 MG PO TABS: 50.0000 mg | ORAL_TABLET | Freq: Every day | ORAL | 0 refills | Status: DC

## 2023-07-19 MED ORDER — LOSARTAN POTASSIUM 100 MG PO TABS: 100.0000 mg | ORAL_TABLET | Freq: Every day | ORAL | 1 refills | Status: DC

## 2023-07-19 MED ORDER — CITALOPRAM HYDROBROMIDE 10 MG PO TABS: 10.0000 mg | ORAL_TABLET | Freq: Every day | ORAL | 3 refills | Status: DC

## 2023-07-20 ENCOUNTER — Ambulatory Visit (INDEPENDENT_AMBULATORY_CARE_PROVIDER_SITE_OTHER): Payer: Medicaid Other | Admitting: Cardiology

## 2023-07-20 ENCOUNTER — Ambulatory Visit: Payer: Medicaid Other | Admitting: Cardiology

## 2023-07-20 ENCOUNTER — Encounter: Payer: Self-pay | Admitting: Cardiology

## 2023-07-20 VITALS — BP 170/90 | HR 64 | Ht 61.0 in | Wt 189.6 lb

## 2023-07-20 DIAGNOSIS — E119 Type 2 diabetes mellitus without complications: Secondary | ICD-10-CM | POA: Diagnosis not present

## 2023-07-20 DIAGNOSIS — E782 Mixed hyperlipidemia: Secondary | ICD-10-CM | POA: Diagnosis not present

## 2023-07-20 DIAGNOSIS — I10 Essential (primary) hypertension: Secondary | ICD-10-CM

## 2023-07-20 DIAGNOSIS — Z794 Long term (current) use of insulin: Secondary | ICD-10-CM

## 2023-07-20 NOTE — Progress Notes (Signed)
Established Patient Office Visit  Subjective:  Patient ID: Laura Camacho, female    DOB: 1961-06-01  Age: 62 y.o. MRN: [id number]  Chief Complaint  Patient presents with   Follow-up    Refills    Patient in office needing refills on her medications. Patient has been out of her medications. Patient is accompanied by her grand daughter. Patient has no acute complaints today. Patient has difficulty seeing, is unable to draw up her insulin. Patient also has difficulty reading medication bottles.  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 Left eye was already having problems. Not taking her meds as cannot see anything. Family unable to help. Will set up home health services asap, needs drug management. Will send all medications to [facility] for bubble packets. Will also send in a referral to home health for assistance with insulin.     No other concerns at this time.   Past Medical History:  Diagnosis Date   Complication of anesthesia    STATES WAS TOLD NOT TO TAKE UNKNOWN ANESTHESIA AFTER NECKFUSION OR HYSTERECTOMY   Diabetes mellitus without complication (HCC)    Dyslipidemia 05/05/2017   Epilepsy (HCC)    Fibromyalgia    GERD (gastroesophageal reflux disease)    Glaucoma (increased eye pressure)    History of Glaucoma but no drops   Headache    Hyperlipidemia    Hypertension    Neurofibromatosis (HCC)    Pyelonephritis     Past Surgical History:  Procedure Laterality Date   ABDOMINAL HYSTERECTOMY     has both ovaries- [facility]   Bilateral Arm Surgery Bilateral    'tumors' removed   BREAST BIOPSY Left 11/05/2018   [place] core venus clip  BIOPSY: GRANULAR CELL TUMOR, 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   BREAST BIOPSY Left 11/05/2018   [place] core axilla hydro marker  NEGATIVE FOR METASTATIC CARCINOMA   CERVICAL FUSION     COLON SURGERY     COLONOSCOPY N/A 07/27/2020   Procedure: COLONOSCOPY;  Surgeon: Regis Bill, MD;   Location: [facility] ENDOSCOPY;  Service: Endoscopy;  Laterality: N/A;   COLONOSCOPY WITH PROPOFOL N/A 02/12/2016   Procedure: COLONOSCOPY WITH PROPOFOL;  Surgeon: Elnita Maxwell, MD;  Location: [facility] ENDOSCOPY;  Service: Endoscopy;  Laterality: N/A;   COLONOSCOPY WITH PROPOFOL N/A 06/30/2020   Procedure: COLONOSCOPY WITH PROPOFOL;  Surgeon: Regis Bill, MD;  Location: [facility] ENDOSCOPY;  Service: Endoscopy;  Laterality: N/A;   ESOPHAGOGASTRODUODENOSCOPY (EGD) WITH PROPOFOL N/A 02/12/2016   Procedure: ESOPHAGOGASTRODUODENOSCOPY (EGD) WITH PROPOFOL;  Surgeon: Elnita Maxwell, MD;  Location: [facility] ENDOSCOPY;  Service: Endoscopy;  Laterality: N/A;   ESOPHAGOGASTRODUODENOSCOPY (EGD) WITH PROPOFOL N/A 06/30/2020   Procedure: ESOPHAGOGASTRODUODENOSCOPY (EGD) WITH PROPOFOL;  Surgeon: Regis Bill, MD;  Location: [facility] ENDOSCOPY;  Service: Endoscopy;  Laterality: N/A;   VULVECTOMY N/A 06/26/2017   Procedure: WIDE EXCISION VULVECTOMY;  Surgeon: Herold Harms, MD;  Location: [facility] ORS;  Service: Gynecology;  Laterality: N/A;    Social History   Socioeconomic History   Marital status: Single    Spouse name: Not on file   Number of children: Not on file   Years of education: Not on file   Highest education level: Not on file  Occupational History   Not on file  Tobacco Use   Smoking status: Every Day    Current packs/day: 0.50    Types: Cigarettes   Smokeless tobacco: Never   Tobacco comments:  0.5 ppd 06/09/2021  Vaping Use   Vaping status: Never Used  Substance and Sexual Activity   Alcohol use: Yes    Alcohol/week: 3.0 - 4.0 standard drinks of alcohol    Types: 3 - 4 Shots of liquor per week    Comment: once a week   Drug use: Not Currently    Types: Marijuana    Comment: monthly   Sexual activity: Yes    Birth control/protection: Surgical  Other Topics Concern   Not on file  Social History Narrative   Not on file   Social Determinants of Health   Financial  Resource Strain: Not on file  Food Insecurity: Not on file  Transportation Needs: Not on file  Physical Activity: Not on file  Stress: Not on file  Social Connections: Not on file  Intimate Partner Violence: Not on file    Family History  Problem Relation Age of Onset   Diabetes Mother    Diabetes Father    Diabetes Maternal Aunt    Diabetes Maternal Uncle    Breast cancer Cousin    Ovarian cancer Neg Hx    Colon cancer Neg Hx     Allergies  Allergen Reactions   Codeine Anaphylaxis    "Throat swells up"   Bupropion     Unknown   Cefuroxime Rash   Ciprofloxacin Rash   Fluconazole Itching   Ibuprofen Itching   Iodine Hives   Ivp Dye [Iodinated Contrast Media] Hives   Metformin And Related Itching   Nizoral [Ketoconazole] Other (See Comments)    Dizzy/ Fainting   Sulfa Antibiotics Hives   Voltaren [Diclofenac Sodium] Diarrhea and Nausea And Vomiting    Review of Systems  Constitutional: Negative.   HENT: Negative.    Eyes: Negative.   Respiratory: Negative.  Negative for shortness of breath.   Cardiovascular: Negative.  Negative for chest pain.  Gastrointestinal: Negative.  Negative for abdominal pain, constipation and diarrhea.  Genitourinary: Negative.   Musculoskeletal:  Negative for joint pain and myalgias.  Skin: Negative.   Neurological: Negative.  Negative for dizziness and headaches.  Endo/Heme/Allergies: Negative.   All other systems reviewed and are negative.      Objective:   BP (!) 170/90   Pulse 64   Ht 5\' 1"  (1.549 m)   Wt 189 lb 9.6 oz (86 kg)   SpO2 99%   BMI 35.82 kg/m   Vitals:   07/20/23 1434  BP: (!) 170/90  Pulse: 64  Height: 5\' 1"  (1.549 m)  Weight: 189 lb 9.6 oz (86 kg)  SpO2: 99%  BMI (Calculated): 35.84    Physical Exam Vitals and nursing note reviewed.  Constitutional:      Appearance: Normal appearance. She is normal weight.  HENT:     Head: Normocephalic and atraumatic.     Nose: Nose normal.     Mouth/Throat:      Mouth: Mucous membranes are moist.  Eyes:     Extraocular Movements: Extraocular movements intact.     Conjunctiva/sclera: Conjunctivae normal.     Pupils: Pupils are equal, round, and reactive to light.  Cardiovascular:     Rate and Rhythm: Normal rate and regular rhythm.     Pulses: Normal pulses.     Heart sounds: Normal heart sounds.  Pulmonary:     Effort: Pulmonary effort is normal.     Breath sounds: Normal breath sounds.  Abdominal:     General: Abdomen is flat. Bowel sounds are normal.  Palpations: Abdomen is soft.  Musculoskeletal:        General: Normal range of motion.     Cervical back: Normal range of motion.  Skin:    General: Skin is warm and dry.  Neurological:     General: No focal deficit present.     Mental Status: She is alert and oriented to person, place, and time.  Psychiatric:        Mood and Affect: Mood normal.        Behavior: Behavior normal.        Thought Content: Thought content normal.        Judgment: Judgment normal.      No results found for any visits on 07/20/23.  Recent Results (from the past 2160 hour(s))  POCT CBG (Fasting - Glucose)     Status: Abnormal   Collection Time: 04/24/23  2:35 PM  Result Value Ref Range   Glucose Fasting, POC 139 (A) 70 - 99 mg/dL      Assessment & Plan:  Refill all medications to [facility].  Home health referral.  Return in 4 weeks to assess blood pressure control.   Problem List Items Addressed This Visit       Cardiovascular and Mediastinum   Essential hypertension, benign - Primary   Relevant Orders   Ambulatory referral to Home Health     Endocrine   Insulin dependent type 2 diabetes mellitus (HCC)   Relevant Orders   Ambulatory referral to Home Health     Other   Hyperlipidemia    Return in about 4 weeks (around 08/17/2023).   Total time spent: 25 minutes  [employer], NP  07/20/2023   This document may have been prepared by Dragon Voice Recognition  software and as such may include unintentional dictation errors.

## 2023-07-21 MED ORDER — DEXLANSOPRAZOLE 60 MG PO CPDR: 60.0000 mg | DELAYED_RELEASE_CAPSULE | Freq: Every day | ORAL | 3 refills | Status: DC

## 2023-07-21 MED ORDER — ASPIRIN 81 MG PO TBEC: 81.0000 mg | DELAYED_RELEASE_TABLET | Freq: Every day | ORAL | 3 refills | Status: DC

## 2023-07-21 MED ORDER — LOSARTAN POTASSIUM 100 MG PO TABS: 100.0000 mg | ORAL_TABLET | Freq: Every day | ORAL | 3 refills | Status: DC

## 2023-07-21 MED ORDER — TRAZODONE HCL 50 MG PO TABS: 50.0000 mg | ORAL_TABLET | Freq: Every day | ORAL | 3 refills | Status: DC

## 2023-07-21 MED ORDER — DULOXETINE HCL 60 MG PO CPEP: 60.0000 mg | ORAL_CAPSULE | Freq: Every day | ORAL | 3 refills | Status: DC

## 2023-07-21 MED ORDER — DAPAGLIFLOZIN PROPANEDIOL 10 MG PO TABS: 10.0000 mg | ORAL_TABLET | Freq: Every day | ORAL | 3 refills | Status: DC

## 2023-07-21 MED ORDER — LANTUS SOLOSTAR 100 UNIT/ML ~~LOC~~ SOPN: 35.0000 [IU] | PEN_INJECTOR | Freq: Every day | SUBCUTANEOUS | 7 refills | Status: DC

## 2023-07-21 MED ORDER — FAMOTIDINE 40 MG PO TABS: 40.0000 mg | ORAL_TABLET | Freq: Every day | ORAL | 3 refills | Status: DC

## 2023-07-21 MED ORDER — DOCUSATE SODIUM 100 MG PO CAPS: 100.0000 mg | ORAL_CAPSULE | Freq: Every day | ORAL | 3 refills | Status: DC

## 2023-07-21 MED ORDER — DILTIAZEM HCL ER 180 MG PO CP24: 360.0000 mg | ORAL_CAPSULE | Freq: Every day | ORAL | 3 refills | Status: DC

## 2023-07-21 MED ORDER — FLUTICASONE PROPIONATE 50 MCG/ACT NA SUSP: 1.0000 | Freq: Every day | NASAL | 3 refills | Status: DC | PRN

## 2023-07-21 MED ORDER — CHLORTHALIDONE 25 MG PO TABS: 25.0000 mg | ORAL_TABLET | Freq: Every day | ORAL | 3 refills | Status: DC

## 2023-07-21 MED ORDER — GABAPENTIN 300 MG PO CAPS: 300.0000 mg | ORAL_CAPSULE | Freq: Every evening | ORAL | 3 refills | Status: AC | PRN

## 2023-07-21 MED ORDER — AMLODIPINE BESYLATE 5 MG PO TABS: 5.0000 mg | ORAL_TABLET | Freq: Every day | ORAL | 3 refills | Status: DC

## 2023-07-21 MED ORDER — NEBIVOLOL HCL 20 MG PO TABS: 20.0000 mg | ORAL_TABLET | Freq: Every day | ORAL | 3 refills | Status: DC

## 2023-07-21 MED ORDER — BREZTRI AEROSPHERE 160-9-4.8 MCG/ACT IN AERO: 2.0000 | INHALATION_SPRAY | Freq: Two times a day (BID) | RESPIRATORY_TRACT | 5 refills | Status: DC

## 2023-07-21 MED ORDER — CLOPIDOGREL BISULFATE 75 MG PO TABS: 75.0000 mg | ORAL_TABLET | Freq: Every day | ORAL | 3 refills | Status: DC

## 2023-07-21 MED ORDER — ERGOCALCIFEROL 1.25 MG (50000 UT) PO CAPS: 50000.0000 [IU] | ORAL_CAPSULE | ORAL | 3 refills | Status: AC

## 2023-07-21 MED ORDER — ATORVASTATIN CALCIUM 80 MG PO TABS: 80.0000 mg | ORAL_TABLET | Freq: Every day | ORAL | 3 refills | Status: DC

## 2023-07-21 MED ORDER — OMEPRAZOLE 40 MG PO CPDR: 40.0000 mg | DELAYED_RELEASE_CAPSULE | Freq: Every day | ORAL | 3 refills | Status: DC

## 2023-07-21 MED ORDER — CITALOPRAM HYDROBROMIDE 10 MG PO TABS: 10.0000 mg | ORAL_TABLET | Freq: Every day | ORAL | 3 refills | Status: DC

## 2023-07-24 ENCOUNTER — Other Ambulatory Visit: Payer: Self-pay | Admitting: Cardiology

## 2023-07-24 ENCOUNTER — Telehealth: Payer: Self-pay | Admitting: Cardiology

## 2023-07-24 ENCOUNTER — Ambulatory Visit: Payer: Medicaid Other | Admitting: Cardiology

## 2023-07-24 MED ORDER — BREZTRI AEROSPHERE 160-9-4.8 MCG/ACT IN AERO: 2.0000 | INHALATION_SPRAY | Freq: Two times a day (BID) | RESPIRATORY_TRACT | 5 refills | Status: AC

## 2023-07-24 NOTE — Telephone Encounter (Signed)
Mandi at [facility] left VM that she has questions regarding patient's medications for her compliance packaging and an inhaler that we sent in. Need to call pharmacy back at [phone].

## 2023-07-25 NOTE — Telephone Encounter (Signed)
Mandi at [facility] left VM that she has questions regarding patient's medications for her compliance packaging and an inhaler that we sent in. Need to call pharmacy back at [phone].

## 2023-08-17 ENCOUNTER — Ambulatory Visit: Payer: Medicare HMO | Admitting: Cardiology

## 2023-09-01 ENCOUNTER — Other Ambulatory Visit: Payer: Self-pay | Admitting: Cardiology

## 2023-09-01 DIAGNOSIS — E119 Type 2 diabetes mellitus without complications: Secondary | ICD-10-CM

## 2023-09-26 ENCOUNTER — Other Ambulatory Visit: Payer: Self-pay | Admitting: Cardiology

## 2024-01-18 ENCOUNTER — Ambulatory Visit: Admitting: Cardiology

## 2024-02-15 ENCOUNTER — Encounter: Payer: Self-pay | Admitting: *Deleted

## 2024-02-29 ENCOUNTER — Other Ambulatory Visit: Payer: Self-pay | Admitting: *Deleted

## 2024-02-29 DIAGNOSIS — Z122 Encounter for screening for malignant neoplasm of respiratory organs: Secondary | ICD-10-CM

## 2024-02-29 DIAGNOSIS — Z87891 Personal history of nicotine dependence: Secondary | ICD-10-CM

## 2024-02-29 DIAGNOSIS — F17210 Nicotine dependence, cigarettes, uncomplicated: Secondary | ICD-10-CM

## 2024-03-07 ENCOUNTER — Ambulatory Visit

## 2024-03-11 ENCOUNTER — Ambulatory Visit: Admission: RE | Admit: 2024-03-11 | Source: Ambulatory Visit

## 2024-03-14 ENCOUNTER — Ambulatory Visit: Admission: RE | Admit: 2024-03-14 | Source: Ambulatory Visit

## 2024-04-16 ENCOUNTER — Emergency Department
Admission: EM | Admit: 2024-04-16 | Discharge: 2024-04-16 | Disposition: A | Discharge status: Home / Self Care | Attending: Emergency Medicine | Admitting: Emergency Medicine

## 2024-04-16 ENCOUNTER — Emergency Department

## 2024-04-16 ENCOUNTER — Other Ambulatory Visit: Payer: Self-pay

## 2024-04-16 DIAGNOSIS — I6782 Cerebral ischemia: Secondary | ICD-10-CM | POA: Diagnosis not present

## 2024-04-16 DIAGNOSIS — I10 Essential (primary) hypertension: Secondary | ICD-10-CM | POA: Diagnosis not present

## 2024-04-16 DIAGNOSIS — E119 Type 2 diabetes mellitus without complications: Secondary | ICD-10-CM | POA: Diagnosis not present

## 2024-04-16 DIAGNOSIS — R519 Headache, unspecified: Secondary | ICD-10-CM

## 2024-04-16 DIAGNOSIS — Z91148 Patient's other noncompliance with medication regimen for other reason: Secondary | ICD-10-CM

## 2024-04-16 DIAGNOSIS — W19XXXA Unspecified fall, initial encounter: Secondary | ICD-10-CM | POA: Diagnosis not present

## 2024-04-16 DIAGNOSIS — R930 Abnormal findings on diagnostic imaging of skull and head, not elsewhere classified: Secondary | ICD-10-CM

## 2024-04-16 DIAGNOSIS — G4489 Other headache syndrome: Secondary | ICD-10-CM | POA: Diagnosis not present

## 2024-04-16 DIAGNOSIS — E876 Hypokalemia: Secondary | ICD-10-CM | POA: Insufficient documentation

## 2024-04-16 LAB — TROPONIN I (HIGH SENSITIVITY)
Troponin I (High Sensitivity): 3 ng/L (ref <18)
Troponin I (High Sensitivity): 3 ng/L (ref <18)

## 2024-04-16 LAB — CBC WITH DIFFERENTIAL/PLATELET
Abs Immature Granulocytes: 0.01 10*3/uL (ref 0.00–0.07)
Basophils Absolute: 0.1 10*3/uL (ref 0.0–0.1)
Basophils Relative: 1 %
Eosinophils Absolute: 0.1 10*3/uL (ref 0.0–0.5)
Eosinophils Relative: 2 %
HCT: 42.4 % (ref 36.0–46.0)
Hemoglobin: 14.5 g/dL (ref 12.0–15.0)
Immature Granulocytes: 0 %
Lymphocytes Relative: 54 %
Lymphs Abs: 4.2 10*3/uL — ABNORMAL HIGH (ref 0.7–4.0)
MCH: 27.2 pg (ref 26.0–34.0)
MCHC: 34.2 g/dL (ref 30.0–36.0)
MCV: 79.5 fL — ABNORMAL LOW (ref 80.0–100.0)
Monocytes Absolute: 0.4 10*3/uL (ref 0.1–1.0)
Monocytes Relative: 5 %
Neutro Abs: 2.9 10*3/uL (ref 1.7–7.7)
Neutrophils Relative %: 38 %
Platelets: 360 10*3/uL (ref 150–400)
RBC: 5.33 MIL/uL — ABNORMAL HIGH (ref 3.87–5.11)
RDW: 14 % (ref 11.5–15.5)
WBC: 7.7 10*3/uL (ref 4.0–10.5)
nRBC: 0 % (ref 0.0–0.2)

## 2024-04-16 LAB — URINALYSIS, W/ REFLEX TO CULTURE (INFECTION SUSPECTED)
Bilirubin Urine: NEGATIVE
Glucose, UA: 50 mg/dL — AB
Ketones, ur: NEGATIVE mg/dL
Leukocytes,Ua: NEGATIVE
Nitrite: NEGATIVE
Protein, ur: >=300 mg/dL — AB
Specific Gravity, Urine: 1.003 — ABNORMAL LOW (ref 1.005–1.030)
pH: 7 (ref 5.0–8.0)

## 2024-04-16 LAB — COMPREHENSIVE METABOLIC PANEL WITH GFR
ALT: 11 U/L (ref 0–44)
AST: 18 U/L (ref 15–41)
Albumin: 3.5 g/dL (ref 3.5–5.0)
Alkaline Phosphatase: 68 U/L (ref 38–126)
Anion gap: 11 (ref 5–15)
BUN: 9 mg/dL (ref 8–23)
CO2: 23 mmol/L (ref 22–32)
Calcium: 9 mg/dL (ref 8.9–10.3)
Chloride: 107 mmol/L (ref 98–111)
Creatinine, Ser: 1.12 mg/dL — ABNORMAL HIGH (ref 0.44–1.00)
GFR, Estimated: 55 mL/min — ABNORMAL LOW (ref >60)
Glucose, Bld: 160 mg/dL — ABNORMAL HIGH (ref 70–99)
Potassium: 3.1 mmol/L — ABNORMAL LOW (ref 3.5–5.1)
Sodium: 141 mmol/L (ref 135–145)
Total Bilirubin: 0.7 mg/dL (ref 0.0–1.2)
Total Protein: 7 g/dL (ref 6.5–8.1)

## 2024-04-16 MED ORDER — POTASSIUM CHLORIDE CRYS ER 20 MEQ PO TBCR
40.0000 meq | EXTENDED_RELEASE_TABLET | Freq: Once | ORAL | Status: AC
  Administered 2024-04-16: 40 meq via ORAL
  Filled 2024-04-16: qty 2

## 2024-04-16 MED ORDER — PROCHLORPERAZINE EDISYLATE 10 MG/2ML IJ SOLN
10.0000 mg | Freq: Once | INTRAMUSCULAR | Status: AC
  Administered 2024-04-16: 10 mg via INTRAVENOUS
  Filled 2024-04-16: qty 2

## 2024-04-16 MED ORDER — DIPHENHYDRAMINE HCL 50 MG/ML IJ SOLN
25.0000 mg | Freq: Once | INTRAMUSCULAR | Status: AC
  Administered 2024-04-16: 25 mg via INTRAVENOUS
  Filled 2024-04-16: qty 1

## 2024-04-16 MED ORDER — AMLODIPINE BESYLATE 5 MG PO TABS
5.0000 mg | ORAL_TABLET | Freq: Once | ORAL | Status: AC
  Administered 2024-04-16: 5 mg via ORAL
  Filled 2024-04-16: qty 1

## 2024-04-16 MED ORDER — LOSARTAN POTASSIUM 50 MG PO TABS
100.0000 mg | ORAL_TABLET | Freq: Once | ORAL | Status: AC
  Administered 2024-04-16: 100 mg via ORAL
  Filled 2024-04-16: qty 2

## 2024-04-16 NOTE — ED Triage Notes (Signed)
 Pt woke up at home with a severe headache that made her pass out. When she woke up she called her family who called EMS.  Initial BP was 294/140 and 275/120 with EMS.  EMS received order for 10 mg of labetalol which brought BP to 230/110 Other VS WNL CBG 1267 Pt still has H/A

## 2024-04-16 NOTE — Discharge Instructions (Addendum)
 Your evaluation in the emergency department today was overall reassuring, and your blood pressure improved with treatment.  Is very important that you take your blood pressure medications as already prescribed.  As discussed, there were some abnormal findings on the CT scan of your right eye, but these are likely chronic and do not require any further intervention today.  Please do follow-up with your eye care provider for ongoing evaluation of this however.  Return to the emergency department with any new or worsening symptoms.

## 2024-04-16 NOTE — ED Provider Notes (Signed)
 [facility] Provider Note    Event Date/Time   First MD Initiated Contact with Patient 04/16/24 1222     (approximate)   History   Hypertension   HPI  Laura  A Camacho is a 63 y.o. female with history of fibromyalgia, diabetes, hyperlipidemia, hypertension, presenting with headache as well as hypertension.  Has not taken her morning medications.  States that she woke up after 7 AM this morning, had washed up and was putting some laundry in the machine when she started having a frontal headache that started out slowly, more intense over time.  States that is frontal. She denies any new vision changes, no weakness or numbness, states that she is some tingling to the right fingertips.  Did have a syncopal episode, family called EMS.  On their arrival per EMS blood pressures were systolic to 290s and 270s.  EMS had requested for IV antihypertensives over the phone to the ED, they gave her 10 mg of labetalol IV, repeat blood pressures were 230s after labetalol.  She had no chest pain or shortness of breath, no nausea, vomiting, diarrhea, urinary symptoms.  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   Independent history obtained from EMS as above.  On intermittent chart review, she was seen by primary care doctor in September of last year, was noted to have blood pressures of 170s over 90 during that visit.  Requesting for home health services for drug management given that she has trouble seeing her medications and is not taking her medications appropriately.  Physical Exam   Triage Vital Signs: ED Triage Vitals  Encounter Vitals Group     BP      Systolic BP Percentile      Diastolic BP Percentile      Pulse      Resp      Temp      Temp src      SpO2      Weight      Height      Head Circumference      Peak Flow      Pain Score      Pain Loc      Pain Education      Exclude from Growth Chart     Most recent vital  signs: Vitals:   04/16/24 1430 04/16/24 1500  BP: (!) 198/61 (!) 169/58  Pulse: (!) 58 (!) 57  Resp: 20 19  Temp:    SpO2: 96% 97%     General: Awake, no distress.  CV:  Good peripheral perfusion.  Resp:  Normal effort.  Abd:  No distention.  Soft nontender Other:  Teardrop pupil on the right that is normal for her, left pupil is reactive, extraocular movements are intact, no cranial nerve deficits, no focal weakness or numbness.  She is equal DP pulses and radial pulses bilaterally.   ED Results / Procedures / Treatments   Labs (all labs ordered are listed, but only abnormal results are displayed) Labs Reviewed  CBC WITH DIFFERENTIAL/PLATELET - Abnormal; Notable for the following components:      Result Value   RBC 5.33 (*)    MCV 79.5 (*)    Lymphs Abs 4.2 (*)    All other components within normal limits  URINALYSIS, W/ REFLEX TO CULTURE (INFECTION SUSPECTED) - Abnormal; Notable for the following components:   Color, Urine STRAW (*)    APPearance CLEAR (*)  Specific Gravity, Urine 1.003 (*)    Glucose, UA 50 (*)    Hgb urine dipstick SMALL (*)    Protein, ur >=300 (*)    Bacteria, UA RARE (*)    All other components within normal limits  COMPREHENSIVE METABOLIC PANEL WITH GFR - Abnormal; Notable for the following components:   Potassium 3.1 (*)    Glucose, Bld 160 (*)    Creatinine, Ser 1.12 (*)    GFR, Estimated 55 (*)    All other components within normal limits  TROPONIN I (HIGH SENSITIVITY)  TROPONIN I (HIGH SENSITIVITY)     EKG  EKG shows, sinus rhythm, rate of 64, normal QRS, normal QTc, no ischemic ST elevation, T wave flattening in aVL, not significantly changed compared to prior    PROCEDURES:  Critical Care performed: No  Procedures   MEDICATIONS ORDERED IN ED: Medications  prochlorperazine (COMPAZINE) injection 10 mg (10 mg Intravenous Given 04/16/24 1233)  diphenhydrAMINE  (BENADRYL ) injection 25 mg (25 mg Intravenous Given 04/16/24 1234)   amLODipine  (NORVASC ) tablet 5 mg (5 mg Oral Given 04/16/24 1236)  losartan  (COZAAR ) tablet 100 mg (100 mg Oral Given 04/16/24 1236)  potassium chloride  SA (KLOR-CON  M) CR tablet 40 mEq (40 mEq Oral Given 04/16/24 1358)     IMPRESSION / MDM / ASSESSMENT AND PLAN / ED COURSE  I reviewed the triage vital signs and the nursing notes.                              Differential diagnosis includes, but is not limited to, medication noncompliance, intracranial hemorrhage, subarachnoid hemorrhage, no focal deficits on exam to suggest CVA, electrolyte derangements, renal dysfunction, migraine headache, tension headache, arrhythmia, atypical ACS, UTI.  Get labs, EKG, troponin, CT head, UA, migraine cocktail.  Will call CT to expedite her CT.  Will also give her home blood pressure medications since she has not taken them this morning.  Patient's presentation is most consistent with acute presentation with potential threat to life or bodily function.  Independent interpretation of labs and imaging below.  On reassessment patient's systolic blood pressures are in the 170s, says that headache is improving with the migraine cocktail.  States that she thinks she did not take her blood pressure medications yesterday night as well.  Has not set up home health for helping her with her medications.  Did discuss with patient that if her workup is negative here and when she safe to go home, will put in an order for home health face-to-face.  Patient is agreeable with the plan.  Patient signed out pending CT imaging as well as repeat troponin, if blood pressures are still downtrending and symptoms are improving, likely discharge home.  Orders were placed for face-to-face for home health for patient.  The patient is on the cardiac monitor to evaluate for evidence of arrhythmia and/or significant heart rate changes.   Clinical Course as of 04/16/24 1525  Tue Apr 16, 2024  1432 Independent review of labs, potassium is  3.1, will replete, creatinine is mildly elevated, electrolytes not severely deranged, LFTs are normal, UA is not consistent with UTI, no leukocytosis.  Initial troponin is negative. [TT]  1500 S/o: 38F hx htn, med nonadherence - p/w HA - hypertensive on scene and here - BP improved to 170s, 180s - CTH pending  TO DO: - f/u CTH, stable for DC if unremarkable [MM]    Clinical Course User Index [MM]  Collis Deaner, MD [TT] Shane Darling, MD     FINAL CLINICAL IMPRESSION(S) / ED DIAGNOSES   Final diagnoses:  H/O medication noncompliance  Hypertension, unspecified type  Acute nonintractable headache, unspecified headache type     Rx / DC Orders   ED Discharge Orders     None        Note:  This document was prepared using Dragon voice recognition software and may include unintentional dictation errors.    Shane Darling, MD 04/16/24 [id number]

## 2024-04-16 NOTE — ED Notes (Signed)
Pt assisted to bathroom. Tolerated well.

## 2024-04-16 NOTE — ED Provider Notes (Signed)
  Physical Exam  BP (!) 178/71   Pulse (!) 55   Temp 98.6 F (37 C) (Oral)   Resp (!) 22   Ht 5\' 1"  (1.549 m)   Wt 68 kg   SpO2 97%   BMI 28.34 kg/m   Physical Exam  Procedures  Procedures  ED Course / MDM   Clinical Course as of 04/16/24 1719  Tue Apr 16, 2024  1432 Independent review of labs, potassium is 3.1, will replete, creatinine is mildly elevated, electrolytes not severely deranged, LFTs are normal, UA is not consistent with UTI, no leukocytosis.  Initial troponin is negative. [TT]  1500 S/o: 89F hx htn, med nonadherence - p/w HA - hypertensive on scene and here - BP improved to 170s, 180s - CTH pending  TO DO: - f/u CTH, stable for DC if unremarkable [MM]  1623 CTH: IMPRESSION: 1. No evidence of an acute intracranial abnormality. 2. Mild chronic small vessel ischemic changes within the cerebral white matter. 3. Increased density within the posterior right globe, which could reflect age-indeterminate vitreous hemorrhage or retinal detachment.   [MM]  1716 Reevaluated, headache resolved.  Denies any vision changes today were recently, not clinically concern for retinal detachment or vitreous hemorrhage at this time.  Discussed CT findings with patient.  Recommend she follow-up with her eye care provider, tells me she is already established with one.  Blood pressure has remained notably improved from initial readings after taking her home medications.  Counseled on importance of taking her regular antihypertensives.  Recommend primary care follow-up.  ED return precautions in place.  Patient agrees with plan. [MM]    Clinical Course User Index [MM] Collis Deaner, MD [TT] Shane Darling, MD   Medical Decision Making Amount and/or Complexity of Data Reviewed Labs: ordered. Radiology: ordered.  Risk Prescription drug management.          Collis Deaner, MD 04/16/24 1719

## 2024-04-25 ENCOUNTER — Encounter: Payer: Self-pay | Admitting: Cardiology

## 2024-04-25 ENCOUNTER — Ambulatory Visit: Admitting: Cardiology

## 2024-04-25 VITALS — BP 121/70 | HR 58 | Ht 61.0 in | Wt 178.2 lb

## 2024-04-25 DIAGNOSIS — I10 Essential (primary) hypertension: Secondary | ICD-10-CM | POA: Diagnosis not present

## 2024-04-25 DIAGNOSIS — E119 Type 2 diabetes mellitus without complications: Secondary | ICD-10-CM | POA: Diagnosis not present

## 2024-04-25 DIAGNOSIS — Z794 Long term (current) use of insulin: Secondary | ICD-10-CM | POA: Diagnosis not present

## 2024-04-25 DIAGNOSIS — I6523 Occlusion and stenosis of bilateral carotid arteries: Secondary | ICD-10-CM | POA: Diagnosis not present

## 2024-04-25 DIAGNOSIS — E782 Mixed hyperlipidemia: Secondary | ICD-10-CM

## 2024-04-25 DIAGNOSIS — Z1329 Encounter for screening for other suspected endocrine disorder: Secondary | ICD-10-CM

## 2024-04-25 MED ORDER — TOPIRAMATE 25 MG PO TABS: 25.0000 mg | ORAL_TABLET | Freq: Every day | ORAL | 2 refills | Status: DC

## 2024-04-25 NOTE — Progress Notes (Unsigned)
 Established Patient Office Visit  Subjective:  Patient ID: Laura Camacho, female    DOB: 05/23/1961  Age: 63 y.o. MRN: [id number]  Chief Complaint  Patient presents with   Follow-up    Patient in office for follow up. Patient complaining of headaches followed by dizziness. Patient reports history of migraines. Patient also reports having laser surgery of her left eye, states laser affected her brain, causing constant headaches. CT of head 04/2024 done during visit to ED for headache and dizziness, no evidence of an acute intracranial abnormality. Will send in Topamax for migraines.  Patient due for fasting lab work. Patient unlikely to return as instructed, will get blood work today despite not fasting.  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 Unknown if patient followed up with vein and vascular, will send referral. Carotid stenosis could be cause of patient's dizziness.     No other concerns at this time.   Past Medical History:  Diagnosis Date   Complication of anesthesia    STATES WAS TOLD NOT TO TAKE UNKNOWN ANESTHESIA AFTER NECKFUSION OR HYSTERECTOMY   Diabetes mellitus without complication (HCC)    Dyslipidemia 05/05/2017   Epilepsy (HCC)    Fibromyalgia    GERD (gastroesophageal reflux disease)    Glaucoma (increased eye pressure)    History of Glaucoma but no drops   Headache    Hyperlipidemia    Hypertension    Neurofibromatosis (HCC)    Pyelonephritis     Past Surgical History:  Procedure Laterality Date   ABDOMINAL HYSTERECTOMY     has both ovaries- [facility]   Bilateral Arm Surgery Bilateral    'tumors' removed   BREAST BIOPSY Left 11/05/2018   us  core venus clip  BIOPSY: GRANULAR CELL TUMOR, 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   BREAST BIOPSY Left 11/05/2018   us  core axilla hydro marker  NEGATIVE FOR METASTATIC CARCINOMA   CERVICAL FUSION      COLON SURGERY     COLONOSCOPY N/A 07/27/2020   Procedure: COLONOSCOPY;  Surgeon: Shane Darling, MD;  Location: [facility] ENDOSCOPY;  Service: Endoscopy;  Laterality: N/A;   COLONOSCOPY WITH PROPOFOL  N/A 02/12/2016   Procedure: COLONOSCOPY WITH PROPOFOL ;  Surgeon: Luella Sager, MD;  Location: [facility] ENDOSCOPY;  Service: Endoscopy;  Laterality: N/A;   COLONOSCOPY WITH PROPOFOL  N/A 06/30/2020   Procedure: COLONOSCOPY WITH PROPOFOL ;  Surgeon: Shane Darling, MD;  Location: [facility] ENDOSCOPY;  Service: Endoscopy;  Laterality: N/A;   ESOPHAGOGASTRODUODENOSCOPY (EGD) WITH PROPOFOL  N/A 02/12/2016   Procedure: ESOPHAGOGASTRODUODENOSCOPY (EGD) WITH PROPOFOL ;  Surgeon: Luella Sager, MD;  Location: [facility] ENDOSCOPY;  Service: Endoscopy;  Laterality: N/A;   ESOPHAGOGASTRODUODENOSCOPY (EGD) WITH PROPOFOL  N/A 06/30/2020   Procedure: ESOPHAGOGASTRODUODENOSCOPY (EGD) WITH PROPOFOL ;  Surgeon: Shane Darling, MD;  Location: [facility] ENDOSCOPY;  Service: Endoscopy;  Laterality: N/A;   VULVECTOMY N/A 06/26/2017   Procedure: WIDE EXCISION VULVECTOMY;  Surgeon: Colan Dash, MD;  Location: [facility] ORS;  Service: Gynecology;  Laterality: N/A;    Social History   Socioeconomic History   Marital status: Single    Spouse name: Not on file   Number of children: Not on file   Years of education: Not on file   Highest education level: Not on file  Occupational History   Not on file  Tobacco Use   Smoking status: Every Day    Current packs/day: 0.50  Types: Cigarettes   Smokeless tobacco: Never   Tobacco comments:    0.5 ppd 06/09/2021  Vaping Use   Vaping status: Never Used  Substance and Sexual Activity   Alcohol use: Yes    Alcohol/week: 3.0 - 4.0 standard drinks of alcohol    Types: 3 - 4 Shots of liquor per week    Comment: once a week   Drug use: Not Currently    Types: Marijuana    Comment: monthly   Sexual activity: Yes    Birth control/protection: Surgical  Other Topics Concern    Not on file  Social History Narrative   Not on file   Social Drivers of Health   Financial Resource Strain: Not on file  Food Insecurity: Not on file  Transportation Needs: Not on file  Physical Activity: Not on file  Stress: Not on file  Social Connections: Not on file  Intimate Partner Violence: Not on file    Family History  Problem Relation Age of Onset   Diabetes Mother    Diabetes Father    Diabetes Maternal Aunt    Diabetes Maternal Uncle    Breast cancer Cousin    Ovarian cancer Neg Hx    Colon cancer Neg Hx     Allergies  Allergen Reactions   Codeine Anaphylaxis    Throat swells up   Bupropion     Unknown   Cefuroxime Rash   Ciprofloxacin Rash   Fluconazole Itching   Ibuprofen  Itching   Iodine Hives   Ivp Dye [Iodinated Contrast Media] Hives   Metformin And Related Itching   Nizoral [Ketoconazole] Other (See Comments)    Dizzy/ Fainting   Sulfa Antibiotics Hives   Voltaren [Diclofenac Sodium] Diarrhea and Nausea And Vomiting    Outpatient Medications Prior to Visit  Medication Sig   acetaminophen  (TYLENOL ) 650 MG CR tablet Take 650 mg by mouth every 8 (eight) hours as needed for pain.   amLODipine  (NORVASC ) 5 MG tablet Take 1 tablet (5 mg total) by mouth daily.   atorvastatin  (LIPITOR) 80 MG tablet Take 1 tablet (80 mg total) by mouth daily.   Budeson-Glycopyrrol-Formoterol (BREZTRI  AEROSPHERE) 160-9-4.8 MCG/ACT AERO Inhale 2 puffs into the lungs in the morning and at bedtime.   citalopram  (CELEXA ) 10 MG tablet Take 1 tablet (10 mg total) by mouth daily.   clopidogrel  (PLAVIX ) 75 MG tablet Take 1 tablet (75 mg total) by mouth daily.   dapagliflozin  propanediol (FARXIGA ) 10 MG TABS tablet Take 1 tablet (10 mg total) by mouth daily.   DULoxetine  (CYMBALTA ) 60 MG capsule Take 1 capsule (60 mg total) by mouth daily.   ergocalciferol  (VITAMIN D2) 1.25 MG (50000 UT) capsule Take 1 capsule (50,000 Units total) by mouth once a week.   famotidine  (PEPCID )  40 MG tablet Take 1 tablet (40 mg total) by mouth daily.   fluticasone  (FLONASE ) 50 MCG/ACT nasal spray PLACE 1 SPRAY IN EACH NOSTRIL ONCE DAILYAS NEEDED FOR ALLERGIES   gabapentin  (NEURONTIN ) 300 MG capsule Take 1-2 capsules (300-600 mg total) by mouth at bedtime as needed (pain-pt takes 1-2 capsules at bedtime PRN).   LANTUS  SOLOSTAR 100 UNIT/ML Solostar Pen INJECT 35 UNITS SUBCUTANEOUSLY AT BEDTIME   losartan  (COZAAR ) 100 MG tablet Take 1 tablet (100 mg total) by mouth daily.   Nebivolol  HCl 20 MG TABS Take 1 tablet (20 mg total) by mouth daily.   ofloxacin (OCUFLOX) 0.3 % ophthalmic solution Place 1 drop into both eyes 4 (four) times daily.  omeprazole  (PRILOSEC) 40 MG capsule Take 1 capsule (40 mg total) by mouth at bedtime.   traZODone  (DESYREL ) 50 MG tablet Take 1 tablet (50 mg total) by mouth at bedtime.   aspirin  EC 81 MG tablet Take 1 tablet (81 mg total) by mouth daily.   Blood Glucose Calibration (TRUE METRIX LEVEL 1) Low SOLN    chlorthalidone  (HYGROTON ) 25 MG tablet Take 1 tablet (25 mg total) by mouth daily. (Patient not taking: Reported on 04/25/2024)   dexlansoprazole  (DEXILANT ) 60 MG capsule Take 1 capsule (60 mg total) by mouth daily. (Patient not taking: Reported on 04/25/2024)   diltiazem  (DILACOR XR ) 180 MG 24 hr capsule Take 2 capsules (360 mg total) by mouth at bedtime. (Patient not taking: Reported on 04/25/2024)   docusate sodium  (STOOL SOFTENER) 100 MG capsule Take 1 capsule (100 mg total) by mouth daily. (Patient not taking: Reported on 04/25/2024)   dorzolamide-timolol (COSOPT) 22.3-6.8 MG/ML ophthalmic solution Place 1 drop into both eyes 2 (two) times daily. (Patient not taking: Reported on 04/25/2024)   glucose blood (PRODIGY NO CODING BLOOD GLUC) test strip Use as instructed (Patient not taking: Reported on 04/25/2024)   phenazopyridine  (PYRIDIUM ) 100 MG tablet Take 1 tablet (100 mg total) by mouth 3 (three) times daily as needed for pain. (Patient not taking: Reported on  04/25/2024)   Suvorexant (BELSOMRA) 10 MG TABS Take 10 mg by mouth at bedtime as needed. (Patient not taking: Reported on 04/25/2024)   No facility-administered medications prior to visit.    Review of Systems  Constitutional: Negative.   HENT: Negative.    Eyes: Negative.   Respiratory: Negative.  Negative for shortness of breath.   Cardiovascular: Negative.  Negative for chest pain.  Gastrointestinal: Negative.  Negative for abdominal pain, constipation and diarrhea.  Genitourinary: Negative.   Musculoskeletal:  Negative for joint pain and myalgias.  Skin: Negative.   Neurological:  Positive for dizziness and headaches.  Endo/Heme/Allergies: Negative.   All other systems reviewed and are negative.      Objective:   BP 121/70   Pulse (!) 58   Ht 5' 1 (1.549 m)   Wt 178 lb 3.2 oz (80.8 kg)   SpO2 98%   BMI 33.67 kg/m   Vitals:   04/25/24 1357  BP: 121/70  Pulse: (!) 58  Height: 5' 1 (1.549 m)  Weight: 178 lb 3.2 oz (80.8 kg)  SpO2: 98%  BMI (Calculated): 33.69    Physical Exam Vitals and nursing note reviewed.  Constitutional:      Appearance: Normal appearance. She is normal weight.  HENT:     Head: Normocephalic and atraumatic.     Nose: Nose normal.     Mouth/Throat:     Mouth: Mucous membranes are moist.   Eyes:     Extraocular Movements: Extraocular movements intact.     Conjunctiva/sclera: Conjunctivae normal.     Pupils: Pupils are equal, round, and reactive to light.    Cardiovascular:     Rate and Rhythm: Normal rate and regular rhythm.     Pulses: Normal pulses.     Heart sounds: Normal heart sounds.  Pulmonary:     Effort: Pulmonary effort is normal.     Breath sounds: Normal breath sounds.  Abdominal:     General: Abdomen is flat. Bowel sounds are normal.     Palpations: Abdomen is soft.   Musculoskeletal:        General: Normal range of motion.     Cervical back:  Normal range of motion.   Skin:    General: Skin is warm and dry.    Neurological:     General: No focal deficit present.     Mental Status: She is alert and oriented to person, place, and time.   Psychiatric:        Mood and Affect: Mood normal.        Behavior: Behavior normal.        Thought Content: Thought content normal.        Judgment: Judgment normal.      Results for orders placed or performed in visit on 04/25/24  CMP14+EGFR  Result Value Ref Range   Glucose 142 (H) 70 - 99 mg/dL   BUN 11 8 - 27 mg/dL   Creatinine, Ser 0.98 (H) 0.57 - 1.00 mg/dL   eGFR 54 (L) >11 BJ/YNW/2.95   BUN/Creatinine Ratio 10 (L) 12 - 28   Sodium 140 134 - 144 mmol/L   Potassium 4.0 3.5 - 5.2 mmol/L   Chloride 103 96 - 106 mmol/L   CO2 23 20 - 29 mmol/L   Calcium  9.6 8.7 - 10.3 mg/dL   Total Protein 6.8 6.0 - 8.5 g/dL   Albumin 4.0 3.9 - 4.9 g/dL   Globulin, Total 2.8 1.5 - 4.5 g/dL   Bilirubin Total 0.3 0.0 - 1.2 mg/dL   Alkaline Phosphatase 92 44 - 121 IU/L   AST 8 0 - 40 IU/L   ALT 10 0 - 32 IU/L  Lipid Profile  Result Value Ref Range   Cholesterol, Total 209 (H) 100 - 199 mg/dL   Triglycerides 621 0 - 149 mg/dL   HDL 43 >30 mg/dL   VLDL Cholesterol Cal 24 5 - 40 mg/dL   LDL Chol Calc (NIH) 865 (H) 0 - 99 mg/dL   Chol/HDL Ratio 4.9 (H) 0.0 - 4.4 ratio  Hemoglobin A1c  Result Value Ref Range   Hgb A1c MFr Bld 7.5 (H) 4.8 - 5.6 %   Est. average glucose Bld gHb Est-mCnc 169 mg/dL  TSH  Result Value Ref Range   TSH 1.760 0.450 - 4.500 uIU/mL    Recent Results (from the past 2160 hours)  Troponin I (High Sensitivity)     Status: None   Collection Time: 04/16/24 12:26 PM  Result Value Ref Range   Troponin I (High Sensitivity) 3 <18 ng/L    Comment: (NOTE) Elevated high sensitivity troponin I (hsTnI) values and significant  changes across serial measurements may suggest ACS but many other  chronic and acute conditions are known to elevate hsTnI results.  Refer to the Links section for chest pain algorithms and additional   guidance. Performed at [facility], [address] [place], [place] [address]   CBC with Differential     Status: Abnormal   Collection Time: 04/16/24 12:26 PM  Result Value Ref Range   WBC 7.7 4.0 - 10.5 K/uL   RBC 5.33 (H) 3.87 - 5.11 MIL/uL   Hemoglobin 14.5 12.0 - 15.0 g/dL   HCT 62.9 52.8 - 41.3 %   MCV 79.5 (L) 80.0 - 100.0 fL   MCH 27.2 26.0 - 34.0 pg   MCHC 34.2 30.0 - 36.0 g/dL   RDW 24.4 01.0 - 27.2 %   Platelets 360 150 - 400 K/uL   nRBC 0.0 0.0 - 0.2 %   Neutrophils Relative % 38 %   Neutro Abs 2.9 1.7 - 7.7 K/uL   Lymphocytes Relative 54 %  Lymphs Abs 4.2 (H) 0.7 - 4.0 K/uL   Monocytes Relative 5 %   Monocytes Absolute 0.4 0.1 - 1.0 K/uL   Eosinophils Relative 2 %   Eosinophils Absolute 0.1 0.0 - 0.5 K/uL   Basophils Relative 1 %   Basophils Absolute 0.1 0.0 - 0.1 K/uL   Immature Granulocytes 0 %   Abs Immature Granulocytes 0.01 0.00 - 0.07 K/uL    Comment: Performed at [facility], [address] [place], [place] [address]  Urinalysis, w/ Reflex to Culture (Infection Suspected) -Urine, Clean Catch     Status: Abnormal   Collection Time: 04/16/24 12:26 PM  Result Value Ref Range   Specimen Source URINE, CLEAN CATCH    Color, Urine STRAW (A) YELLOW   APPearance CLEAR (A) CLEAR   Specific Gravity, Urine 1.003 (L) 1.005 - 1.030   pH 7.0 5.0 - 8.0   Glucose, UA 50 (A) NEGATIVE mg/dL   Hgb urine dipstick SMALL (A) NEGATIVE   Bilirubin Urine NEGATIVE NEGATIVE   Ketones, ur NEGATIVE NEGATIVE mg/dL   Protein, ur >=191 (A) NEGATIVE mg/dL   Nitrite NEGATIVE NEGATIVE   Leukocytes,Ua NEGATIVE NEGATIVE   RBC / HPF 0-5 0 - 5 RBC/hpf   WBC, UA 0-5 0 - 5 WBC/hpf    Comment:        Reflex urine culture not performed if WBC <=10, OR if Squamous epithelial cells >5. If Squamous epithelial cells >5 suggest recollection.    Bacteria, UA RARE (A) NONE SEEN   Squamous Epithelial / HPF 0-5 0 - 5 /HPF    Comment: Performed at [facility], [address] [place], [place] [address]  Comprehensive metabolic panel with GFR     Status: Abnormal   Collection Time: 04/16/24 12:48 PM  Result Value Ref Range   Sodium 141 135 - 145 mmol/L   Potassium 3.1 (L) 3.5 - 5.1 mmol/L   Chloride 107 98 - 111 mmol/L   CO2 23 22 - 32 mmol/L   Glucose, Bld 160 (H) 70 - 99 mg/dL    Comment: Glucose reference range applies only to samples taken after fasting for at least 8 hours.   BUN 9 8 - 23 mg/dL   Creatinine, Ser 5.62 (H) 0.44 - 1.00 mg/dL   Calcium  9.0 8.9 - 10.3 mg/dL   Total Protein 7.0 6.5 - 8.1 g/dL   Albumin 3.5 3.5 - 5.0 g/dL   AST 18 15 - 41 U/L   ALT 11 0 - 44 U/L   Alkaline Phosphatase 68 38 - 126 U/L   Total Bilirubin 0.7 0.0 - 1.2 mg/dL   GFR, Estimated 55 (L) >60 mL/min    Comment: (NOTE) Calculated using the CKD-EPI Creatinine Equation (2021)    Anion gap 11 5 - 15    Comment: Performed at [facility], [address], [place], [place] [address]  Troponin I (High Sensitivity)     Status: None   Collection Time: 04/16/24  3:15 PM  Result Value Ref Range   Troponin I (High Sensitivity) 3 <18 ng/L    Comment: (NOTE) Elevated high sensitivity troponin I (hsTnI) values and significant  changes across serial measurements may suggest ACS but many other  chronic and acute conditions are known to elevate hsTnI results.  Refer to the Links section for chest pain algorithms and additional  guidance. Performed at [facility], [address] [place], [place] [address]   [id number]     Status: Abnormal  Collection Time: 04/25/24  2:33 PM  Result Value Ref Range   Glucose 142 (H) 70 - 99 mg/dL   BUN 11 8 - 27 mg/dL   Creatinine, Ser 8.46 (H) 0.57 - 1.00 mg/dL   eGFR 54 (L) >96 EX/BMW/4.13   BUN/Creatinine Ratio 10 (L) 12 - 28   Sodium 140 134 - 144 mmol/L   Potassium 4.0 3.5 - 5.2 mmol/L   Chloride 103 96 - 106 mmol/L   CO2 23 20 - 29 mmol/L   Calcium  9.6 8.7 - 10.3 mg/dL   Total Protein  6.8 6.0 - 8.5 g/dL   Albumin 4.0 3.9 - 4.9 g/dL   Globulin, Total 2.8 1.5 - 4.5 g/dL   Bilirubin Total 0.3 0.0 - 1.2 mg/dL   Alkaline Phosphatase 92 44 - 121 IU/L   AST 8 0 - 40 IU/L   ALT 10 0 - 32 IU/L  Lipid Profile     Status: Abnormal   Collection Time: 04/25/24  2:33 PM  Result Value Ref Range   Cholesterol, Total 209 (H) 100 - 199 mg/dL   Triglycerides 244 0 - 149 mg/dL   HDL 43 >01 mg/dL   VLDL Cholesterol Cal 24 5 - 40 mg/dL   LDL Chol Calc (NIH) 027 (H) 0 - 99 mg/dL   Chol/HDL Ratio 4.9 (H) 0.0 - 4.4 ratio    Comment:                                   T. Chol/HDL Ratio                                             Men  Women                               1/2 Avg.Risk  3.4    3.3                                   Avg.Risk  5.0    4.4                                2X Avg.Risk  9.6    7.1                                3X Avg.Risk 23.4   11.0   Hemoglobin A1c     Status: Abnormal   Collection Time: 04/25/24  2:33 PM  Result Value Ref Range   Hgb A1c MFr Bld 7.5 (H) 4.8 - 5.6 %    Comment:          Prediabetes: 5.7 - 6.4          Diabetes: >6.4          Glycemic control for adults with diabetes: <7.0    Est. average glucose Bld gHb Est-mCnc 169 mg/dL  TSH     Status: None   Collection Time: 04/25/24  2:33 PM  Result Value Ref Range   TSH 1.760 0.450 - 4.500 uIU/mL  Assessment & Plan:  Topamax for migraines Lab work today Referral sent to vein and vascular  Problem List Items Addressed This Visit       Cardiovascular and Mediastinum   Essential hypertension, benign - Primary   Relevant Orders   CMP14+EGFR (Completed)   Carotid stenosis   Relevant Orders   Ambulatory referral to Vascular Surgery     Endocrine   Insulin  dependent type 2 diabetes mellitus (HCC)   Relevant Orders   Hemoglobin A1c (Completed)     Other   Hyperlipidemia   Relevant Orders   Lipid Profile (Completed)   Other Visit Diagnoses       Thyroid  disorder screening        Relevant Orders   TSH (Completed)       Return in about 4 weeks (around 05/23/2024).   Total time spent: 25 minutes  [employer], NP  04/25/2024   This document may have been prepared by Dragon Voice Recognition software and as such may include unintentional dictation errors.

## 2024-04-26 ENCOUNTER — Ambulatory Visit: Payer: Self-pay | Admitting: Cardiology

## 2024-04-26 LAB — TSH: TSH: 1.76 u[IU]/mL (ref 0.450–4.500)

## 2024-04-26 LAB — CMP14+EGFR
ALT: 10 IU/L (ref 0–32)
AST: 8 IU/L (ref 0–40)
Albumin: 4 g/dL (ref 3.9–4.9)
Alkaline Phosphatase: 92 IU/L (ref 44–121)
BUN/Creatinine Ratio: 10 — ABNORMAL LOW (ref 12–28)
BUN: 11 mg/dL (ref 8–27)
Bilirubin Total: 0.3 mg/dL (ref 0.0–1.2)
CO2: 23 mmol/L (ref 20–29)
Calcium: 9.6 mg/dL (ref 8.7–10.3)
Chloride: 103 mmol/L (ref 96–106)
Creatinine, Ser: 1.14 mg/dL — ABNORMAL HIGH (ref 0.57–1.00)
Globulin, Total: 2.8 g/dL (ref 1.5–4.5)
Glucose: 142 mg/dL — ABNORMAL HIGH (ref 70–99)
Potassium: 4 mmol/L (ref 3.5–5.2)
Sodium: 140 mmol/L (ref 134–144)
Total Protein: 6.8 g/dL (ref 6.0–8.5)
eGFR: 54 mL/min/{1.73_m2} — ABNORMAL LOW (ref >59)

## 2024-04-26 LAB — LIPID PANEL
Chol/HDL Ratio: 4.9 ratio — ABNORMAL HIGH (ref 0.0–4.4)
Cholesterol, Total: 209 mg/dL — ABNORMAL HIGH (ref 100–199)
HDL: 43 mg/dL (ref >39)
LDL Chol Calc (NIH): 142 mg/dL — ABNORMAL HIGH (ref 0–99)
Triglycerides: 132 mg/dL (ref 0–149)
VLDL Cholesterol Cal: 24 mg/dL (ref 5–40)

## 2024-04-26 LAB — HEMOGLOBIN A1C
Est. average glucose Bld gHb Est-mCnc: 169 mg/dL
Hgb A1c MFr Bld: 7.5 % — ABNORMAL HIGH (ref 4.8–5.6)

## 2024-05-02 NOTE — Progress Notes (Signed)
 Called again, no answer this time

## 2024-05-13 ENCOUNTER — Emergency Department
Admission: EM | Admit: 2024-05-13 | Discharge: 2024-05-14 | Disposition: A | Discharge status: Home / Self Care | Attending: Emergency Medicine | Admitting: Emergency Medicine

## 2024-05-13 ENCOUNTER — Other Ambulatory Visit: Payer: Self-pay

## 2024-05-13 DIAGNOSIS — I10 Essential (primary) hypertension: Secondary | ICD-10-CM | POA: Diagnosis not present

## 2024-05-13 DIAGNOSIS — I6523 Occlusion and stenosis of bilateral carotid arteries: Secondary | ICD-10-CM | POA: Insufficient documentation

## 2024-05-13 DIAGNOSIS — R110 Nausea: Secondary | ICD-10-CM | POA: Diagnosis not present

## 2024-05-13 DIAGNOSIS — R519 Headache, unspecified: Secondary | ICD-10-CM | POA: Diagnosis not present

## 2024-05-13 DIAGNOSIS — E876 Hypokalemia: Secondary | ICD-10-CM | POA: Insufficient documentation

## 2024-05-13 DIAGNOSIS — R42 Dizziness and giddiness: Secondary | ICD-10-CM | POA: Diagnosis not present

## 2024-05-13 DIAGNOSIS — G4489 Other headache syndrome: Secondary | ICD-10-CM | POA: Diagnosis not present

## 2024-05-13 LAB — COMPREHENSIVE METABOLIC PANEL WITH GFR
ALT: 13 U/L (ref 0–44)
AST: 18 U/L (ref 15–41)
Albumin: 3.6 g/dL (ref 3.5–5.0)
Alkaline Phosphatase: 79 U/L (ref 38–126)
Anion gap: 10 (ref 5–15)
BUN: 9 mg/dL (ref 8–23)
CO2: 26 mmol/L (ref 22–32)
Calcium: 9.3 mg/dL (ref 8.9–10.3)
Chloride: 106 mmol/L (ref 98–111)
Creatinine, Ser: 1.22 mg/dL — ABNORMAL HIGH (ref 0.44–1.00)
GFR, Estimated: 50 mL/min — ABNORMAL LOW (ref >60)
Glucose, Bld: 98 mg/dL (ref 70–99)
Potassium: 2.6 mmol/L — CL (ref 3.5–5.1)
Sodium: 142 mmol/L (ref 135–145)
Total Bilirubin: 0.6 mg/dL (ref 0.0–1.2)
Total Protein: 7.3 g/dL (ref 6.5–8.1)

## 2024-05-13 LAB — CBC
HCT: 42.7 % (ref 36.0–46.0)
Hemoglobin: 14.3 g/dL (ref 12.0–15.0)
MCH: 27.1 pg (ref 26.0–34.0)
MCHC: 33.5 g/dL (ref 30.0–36.0)
MCV: 80.9 fL (ref 80.0–100.0)
Platelets: 421 K/uL — ABNORMAL HIGH (ref 150–400)
RBC: 5.28 MIL/uL — ABNORMAL HIGH (ref 3.87–5.11)
RDW: 13.9 % (ref 11.5–15.5)
WBC: 13 K/uL — ABNORMAL HIGH (ref 4.0–10.5)
nRBC: 0 % (ref 0.0–0.2)

## 2024-05-13 LAB — TROPONIN I (HIGH SENSITIVITY): Troponin I (High Sensitivity): <2 ng/L (ref <18)

## 2024-05-13 MED ORDER — DIPHENHYDRAMINE HCL 50 MG/ML IJ SOLN
50.0000 mg | Freq: Once | INTRAMUSCULAR | Status: AC
  Administered 2024-05-14: 50 mg via INTRAVENOUS
  Filled 2024-05-13: qty 1

## 2024-05-13 MED ORDER — METHYLPREDNISOLONE SODIUM SUCC 40 MG IJ SOLR
40.0000 mg | Freq: Once | INTRAMUSCULAR | Status: AC
  Administered 2024-05-14: 40 mg via INTRAVENOUS
  Filled 2024-05-13: qty 1

## 2024-05-13 MED ORDER — DIPHENHYDRAMINE HCL 25 MG PO CAPS: 50.0000 mg | ORAL_CAPSULE | Freq: Once | ORAL | Status: AC

## 2024-05-13 NOTE — ED Triage Notes (Signed)
 Pt to ED via [facility] from home c/o HTN. Pt has hx of HTN and states has been taking medication as prescribed. Endorses some vomiting, headache, and dizziness associated with high blood pressure. BP was 220/84 for EMS. Denies CP, SOB, fevers

## 2024-05-14 ENCOUNTER — Emergency Department

## 2024-05-14 DIAGNOSIS — I6523 Occlusion and stenosis of bilateral carotid arteries: Secondary | ICD-10-CM | POA: Diagnosis not present

## 2024-05-14 DIAGNOSIS — R519 Headache, unspecified: Secondary | ICD-10-CM | POA: Diagnosis not present

## 2024-05-14 DIAGNOSIS — I10 Essential (primary) hypertension: Secondary | ICD-10-CM | POA: Diagnosis not present

## 2024-05-14 DIAGNOSIS — Z981 Arthrodesis status: Secondary | ICD-10-CM | POA: Diagnosis not present

## 2024-05-14 LAB — URINALYSIS, ROUTINE W REFLEX MICROSCOPIC
Bilirubin Urine: NEGATIVE
Glucose, UA: 50 mg/dL — AB
Ketones, ur: NEGATIVE mg/dL
Leukocytes,Ua: NEGATIVE
Nitrite: NEGATIVE
Protein, ur: >=300 mg/dL — AB
Specific Gravity, Urine: 1.004 — ABNORMAL LOW (ref 1.005–1.030)
pH: 5 (ref 5.0–8.0)

## 2024-05-14 MED ORDER — POTASSIUM CHLORIDE CRYS ER 20 MEQ PO TBCR
40.0000 meq | EXTENDED_RELEASE_TABLET | Freq: Once | ORAL | Status: AC
  Administered 2024-05-14: 40 meq via ORAL
  Filled 2024-05-14: qty 2

## 2024-05-14 MED ORDER — IOHEXOL 350 MG/ML SOLN
75.0000 mL | Freq: Once | INTRAVENOUS | Status: AC | PRN
  Administered 2024-05-14: 75 mL via INTRAVENOUS

## 2024-05-14 MED ORDER — LORAZEPAM 2 MG/ML IJ SOLN
1.0000 mg | Freq: Once | INTRAMUSCULAR | Status: AC
  Administered 2024-05-14: 1 mg via INTRAVENOUS
  Filled 2024-05-14: qty 1

## 2024-05-14 NOTE — Discharge Instructions (Addendum)
 Please schedule a follow up appointment with Dr. Marea (Vascular Surgery) and Dr. Lane (Neurology) to discuss next steps for the narrowing in your neck arteries.  Follow up with your primary care doctor as well for blood pressure recheck and adjustment of your blood pressure medicine if needed.  Thank you for the opportunity to take care of you in our Emergency Department. You have been diagnosed with high blood pressure, also known as hypertension. This means that the force of blood against the walls of your blood vessels called is too strong. It also means that your heart has to work harder to move the blood. High blood pressure usually has no symptoms, but over time, it can cause serious health problems such as Heart attack and heart failure Stroke Kidney disease and failure Vision loss With the help from your healthcare provider and some important life style changes, you can manage your blood pressure and protect your health. Please read the instructions provided on hypertension, how to manage it and how to check your blood pressure. Additionally, use the blood pressure log provided to record your blood pressures. Take the blood pressure log with you to your primary care doctor so that they can adjust your blood pressure medications if needed. Please read the instructions on follow-up appointment. Return to the ER or Call 911 right away if you have any of these symptoms: Chest pain or shortness of breath Severe headache Weakness, tingling, or numbness of your face, arms, or legs (especially on 1 side of the body) Sudden change in vision Confusion, trouble speaking, or trouble understanding speech

## 2024-05-14 NOTE — ED Provider Notes (Addendum)
 [facility] Provider Note    Event Date/Time   First MD Initiated Contact with Patient 05/13/24 2349     (approximate)   History   Chief Complaint: Hypertension   HPI  Laura Camacho is a 63 y.o. female with a history of fibromyalgia GERD hypertension neurofibromatosis who comes ED complaining of elevated blood pressure, associated with worsening headache over the past week, along with vertiginous symptoms and an episode of vomiting.  No fever, no neck stiffness.  No trauma.  No vision changes paresthesias or motor weakness.        Past Medical History:  Diagnosis Date   Complication of anesthesia    STATES WAS TOLD NOT TO TAKE UNKNOWN ANESTHESIA AFTER NECKFUSION OR HYSTERECTOMY   Diabetes mellitus without complication (HCC)    Dyslipidemia 05/05/2017   Epilepsy (HCC)    Fibromyalgia    GERD (gastroesophageal reflux disease)    Glaucoma (increased eye pressure)    History of Glaucoma but no drops   Headache    Hyperlipidemia    Hypertension    Neurofibromatosis [facility])    Pyelonephritis     Current Outpatient Rx   Order #: [id number] Class: Historical Med   Order #: [id number] Class: Print   Order #: [id number] Class: Print   Order #: [id number] Class: Print   Order #: [id number] Class: Historical Med   Order #: [id number] Class: Print   Order #: [id number] Class: Print   Order #: [id number] Class: Print   Order #: [id number] Class: Print   Order #: [id number] Class: Print   Order #: [id number] Class: Print   Order #: [id number] Class: Print   Order #: [id number] Class: Print   Order #: [id number] Class: Historical Med   Order #: [id number] Class: Print   Order #: [id number] Class: Print   Order #: [id number] Class: Print   Order #: [id number] Class: Normal   Order #: [id number] Class: Print   Order #: [id number] Class: Normal   Order #: [id number] Class: Normal   Order #: [id number] Class: Print   Order #: [id number] Class: Print   Order #: [id number] Class: Historical  Med   Order #: [id number] Class: Print   Order #: [id number] Class: Print   Order #: [id number] Class: Historical Med   Order #: [id number] Class: Normal   Order #: [id number] Class: Print    Past Surgical History:  Procedure Laterality Date   ABDOMINAL HYSTERECTOMY     has both ovaries- [place]   Bilateral Arm Surgery Bilateral    'tumors' removed   BREAST BIOPSY Left 11/05/2018   us  core venus clip  BIOPSY: GRANULAR CELL TUMOR, 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   BREAST BIOPSY Left 11/05/2018   us  core axilla hydro marker  NEGATIVE FOR METASTATIC CARCINOMA   CERVICAL FUSION     COLON SURGERY     COLONOSCOPY N/A 07/27/2020   Procedure: COLONOSCOPY;  Surgeon: Maryruth Ole DASEN, MD;  Location: [facility] ENDOSCOPY;  Service: Endoscopy;  Laterality: N/A;   COLONOSCOPY WITH PROPOFOL  N/A 02/12/2016   Procedure: COLONOSCOPY WITH PROPOFOL ;  Surgeon: Donnice Vaughn Manes, MD;  Location: [facility] ENDOSCOPY;  Service: Endoscopy;  Laterality: N/A;   COLONOSCOPY WITH PROPOFOL  N/A 06/30/2020   Procedure: COLONOSCOPY WITH PROPOFOL ;  Surgeon: Maryruth Ole DASEN, MD;  Location: [facility] ENDOSCOPY;  Service: Endoscopy;  Laterality: N/A;   ESOPHAGOGASTRODUODENOSCOPY (EGD) WITH PROPOFOL  N/A 02/12/2016   Procedure: ESOPHAGOGASTRODUODENOSCOPY (EGD) WITH PROPOFOL ;  Surgeon: Donnice Vaughn Manes, MD;  Location: [facility] ENDOSCOPY;  Service: Endoscopy;  Laterality: N/A;   ESOPHAGOGASTRODUODENOSCOPY (EGD) WITH PROPOFOL  N/A 06/30/2020   Procedure: ESOPHAGOGASTRODUODENOSCOPY (EGD) WITH PROPOFOL ;  Surgeon: Maryruth Ole DASEN, MD;  Location: [facility] ENDOSCOPY;  Service: Endoscopy;  Laterality: N/A;   VULVECTOMY N/A 06/26/2017   Procedure: WIDE EXCISION VULVECTOMY;  Surgeon: Kathe Gladis LABOR, MD;  Location: [facility] ORS;  Service: Gynecology;  Laterality: N/A;    Physical Exam   Triage Vital Signs: ED Triage Vitals  Encounter Vitals Group     BP 05/13/24 2218 (!) 223/83     Girls Systolic BP Percentile --      Girls Diastolic BP Percentile --       Boys Systolic BP Percentile --      Boys Diastolic BP Percentile --      Pulse Rate 05/13/24 2218 60     Resp 05/13/24 2218 18     Temp 05/13/24 2218 98.5 F (36.9 C)     Temp Source 05/13/24 2218 Oral     SpO2 05/13/24 2218 100 %     Weight 05/13/24 2216 190 lb (86.2 kg)     Height 05/13/24 2216 5' 1 (1.549 m)     Head Circumference --      Peak Flow --      Pain Score 05/13/24 2215 8     Pain Loc --      Pain Education --      Exclude from Growth Chart --     Most recent vital signs: Vitals:   05/14/24 0530 05/14/24 0645  BP:    Pulse: (!) 50 (!) 51  Resp:    Temp:    SpO2: 94% 98%    General: Awake, no distress.  CV:  Good peripheral perfusion.  Regular rate rhythm Resp:  Normal effort.  Clear to auscultation Abd:  No distention.  Soft nontender Other:  Cranial nerves III through XII intact.  No midline spinal tenderness.   ED Results / Procedures / Treatments   Labs (all labs ordered are listed, but only abnormal results are displayed) Labs Reviewed  COMPREHENSIVE METABOLIC PANEL WITH GFR - Abnormal; Notable for the following components:      Result Value   Potassium 2.6 (*)    Creatinine, Ser 1.22 (*)    GFR, Estimated 50 (*)    All other components within normal limits  CBC - Abnormal; Notable for the following components:   WBC 13.0 (*)    RBC 5.28 (*)    Platelets 421 (*)    All other components within normal limits  URINALYSIS, ROUTINE W REFLEX MICROSCOPIC - Abnormal; Notable for the following components:   Color, Urine YELLOW (*)    APPearance HAZY (*)    Specific Gravity, Urine 1.004 (*)    Glucose, UA 50 (*)    Hgb urine dipstick SMALL (*)    Protein, ur >=300 (*)    Bacteria, UA FEW (*)    All other components within normal limits  TROPONIN I (HIGH SENSITIVITY)     EKG Interpreted by me Sinus bradycardia rate of 57.  Normal axis, normal intervals.  Normal QRS ST segments and T waves   RADIOLOGY CT head interpreted by me,  negative for mass or intracranial hemorrhage.  Radiology report reviewed  CT angiogram head and neck pending   PROCEDURES:  Procedures   MEDICATIONS ORDERED IN ED: Medications  methylPREDNISolone  sodium succinate  (SOLU-MEDROL ) 40 mg/mL injection 40 mg (40 mg Intravenous Given 05/14/24 0053)  diphenhydrAMINE  (BENADRYL ) capsule 50 mg ( Oral See Alternative 05/14/24 0259)    Or  diphenhydrAMINE  (BENADRYL ) injection 50 mg (50 mg Intravenous Given 05/14/24 0259)  LORazepam  (ATIVAN ) injection 1 mg (  1 mg Intravenous Given 05/14/24 0022)  potassium chloride  SA (KLOR-CON  M) CR tablet 40 mEq (40 mEq Oral Given 05/14/24 0053)  iohexol  (OMNIPAQUE ) 350 MG/ML injection 75 mL (75 mLs Intravenous Contrast Given 05/14/24 0500)     IMPRESSION / MDM / ASSESSMENT AND PLAN / ED COURSE  I reviewed the triage vital signs and the nursing notes.  DDx: Intracranial hemorrhage, intracranial mass, cerebral aneurysm, vertebral artery dissection, uncontrolled hypertension  Patient's presentation is most consistent with acute presentation with potential threat to life or bodily function.  Patient presents with worsening headache associated with vertigo and vomiting today.  Vital signs are normal except for uncontrolled hypertension with blood pressure of about 220/85.  Initial labs all unremarkable except for potassium level of 2.6 which was supplemented orally.  CT head unremarkable.  Patient given premedication for CT angiogram head and neck.  If negative I think she is stable for discharge home to follow-up with PCP regarding her blood pressure regimen.    ----------------------------------------- 7:35 AM on 05/14/2024 ----------------------------------------- CT angiogram negative for aneurysm, intracranial hemorrhage, or sign of venous sinus thrombosis.  Further record review does show that she had similar carotid artery stenosis findings from 2023 CT angiogram, but has not followed up with vascular surgery and  neurology since then.  I have encouraged her to do so.  No signs of acute neurodeficit, stable for discharge.     FINAL CLINICAL IMPRESSION(S) / ED DIAGNOSES   Final diagnoses:  Uncontrolled hypertension  Severe headache  Bilateral carotid artery stenosis     Rx / DC Orders   ED Discharge Orders     None        Note:  This document was prepared using Dragon voice recognition software and may include unintentional dictation errors.   Viviann Pastor, MD 05/14/24 [id number]    Viviann Pastor, MD 05/14/24 [phone]

## 2024-05-23 ENCOUNTER — Encounter: Payer: Self-pay | Admitting: Cardiology

## 2024-05-23 ENCOUNTER — Ambulatory Visit: Admitting: Cardiology

## 2024-05-23 VITALS — BP 135/85 | HR 59 | Ht 61.0 in | Wt 186.6 lb

## 2024-05-23 DIAGNOSIS — I10 Essential (primary) hypertension: Secondary | ICD-10-CM | POA: Diagnosis not present

## 2024-05-23 DIAGNOSIS — E119 Type 2 diabetes mellitus without complications: Secondary | ICD-10-CM

## 2024-05-23 MED ORDER — AMLODIPINE BESYLATE 10 MG PO TABS: 10.0000 mg | ORAL_TABLET | Freq: Every day | ORAL | 5 refills | Status: DC

## 2024-05-23 NOTE — Progress Notes (Signed)
 Established Patient Office Visit  Subjective:  Patient ID: Laura Camacho, female    DOB: Jun 09, 1961  Age: 63 y.o. MRN: [id number]  Chief Complaint  Patient presents with   Follow-up    Pt is taking amlodipine  2x a day due to symptoms.     Patient in office for 4 week follow up. Patient continues to have episodes of headaches followed by dizziness. States she takes an extra amlodipine  5 mg and it resolves. Will increase amlodipine  to 10 mg daily.  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 Scheduled to see vein and vascular 06/11/24, discussed importance of keeping this appointment.     No other concerns at this time.   Past Medical History:  Diagnosis Date   Complication of anesthesia    STATES WAS TOLD NOT TO TAKE UNKNOWN ANESTHESIA AFTER NECKFUSION OR HYSTERECTOMY   Diabetes mellitus without complication (HCC)    Dyslipidemia 05/05/2017   Epilepsy (HCC)    Fibromyalgia    GERD (gastroesophageal reflux disease)    Glaucoma (increased eye pressure)    History of Glaucoma but no drops   Headache    Hyperlipidemia    Hypertension    Neurofibromatosis (HCC)    Pyelonephritis     Past Surgical History:  Procedure Laterality Date   ABDOMINAL HYSTERECTOMY     has both ovaries- [facility]   Bilateral Arm Surgery Bilateral    'tumors' removed   BREAST BIOPSY Left 11/05/2018   us  core venus clip  BIOPSY: GRANULAR CELL TUMOR, 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   BREAST BIOPSY Left 11/05/2018   us  core axilla hydro marker  NEGATIVE FOR METASTATIC CARCINOMA   CERVICAL FUSION     COLON SURGERY     COLONOSCOPY N/A 07/27/2020   Procedure: COLONOSCOPY;  Surgeon: Maryruth Ole DASEN, MD;  Location: [facility] ENDOSCOPY;  Service: Endoscopy;  Laterality: N/A;   COLONOSCOPY WITH PROPOFOL  N/A 02/12/2016   Procedure: COLONOSCOPY WITH PROPOFOL ;  Surgeon: Donnice Vaughn Manes, MD;  Location: [facility]  ENDOSCOPY;  Service: Endoscopy;  Laterality: N/A;   COLONOSCOPY WITH PROPOFOL  N/A 06/30/2020   Procedure: COLONOSCOPY WITH PROPOFOL ;  Surgeon: Maryruth Ole DASEN, MD;  Location: [facility] ENDOSCOPY;  Service: Endoscopy;  Laterality: N/A;   ESOPHAGOGASTRODUODENOSCOPY (EGD) WITH PROPOFOL  N/A 02/12/2016   Procedure: ESOPHAGOGASTRODUODENOSCOPY (EGD) WITH PROPOFOL ;  Surgeon: Donnice Vaughn Manes, MD;  Location: [facility] ENDOSCOPY;  Service: Endoscopy;  Laterality: N/A;   ESOPHAGOGASTRODUODENOSCOPY (EGD) WITH PROPOFOL  N/A 06/30/2020   Procedure: ESOPHAGOGASTRODUODENOSCOPY (EGD) WITH PROPOFOL ;  Surgeon: Maryruth Ole DASEN, MD;  Location: [facility] ENDOSCOPY;  Service: Endoscopy;  Laterality: N/A;   VULVECTOMY N/A 06/26/2017   Procedure: WIDE EXCISION VULVECTOMY;  Surgeon: Kathe Gladis LABOR, MD;  Location: [facility] ORS;  Service: Gynecology;  Laterality: N/A;    Social History   Socioeconomic History   Marital status: Single    Spouse name: Not on file   Number of children: Not on file   Years of education: Not on file   Highest education level: Not on file  Occupational History   Not on file  Tobacco Use   Smoking status: Every Day    Current packs/day: 0.50    Types: Cigarettes   Smokeless tobacco: Never   Tobacco comments:    0.5 ppd 06/09/2021  Vaping Use   Vaping status: Never Used  Substance and Sexual Activity   Alcohol use: Yes    Alcohol/week: 3.0 - 4.0  standard drinks of alcohol    Types: 3 - 4 Shots of liquor per week    Comment: once a week   Drug use: Not Currently    Types: Marijuana    Comment: monthly   Sexual activity: Yes    Birth control/protection: Surgical  Other Topics Concern   Not on file  Social History Narrative   Not on file   Social Drivers of Health   Financial Resource Strain: Not on file  Food Insecurity: Not on file  Transportation Needs: Not on file  Physical Activity: Not on file  Stress: Not on file  Social Connections: Not on file  Intimate Partner  Violence: Not on file    Family History  Problem Relation Age of Onset   Diabetes Mother    Diabetes Father    Diabetes Maternal Aunt    Diabetes Maternal Uncle    Breast cancer Cousin    Ovarian cancer Neg Hx    Colon cancer Neg Hx     Allergies  Allergen Reactions   Codeine Anaphylaxis    Throat swells up   Bupropion     Unknown   Cefuroxime Rash   Ciprofloxacin Rash   Fluconazole Itching   Ibuprofen  Itching   Iodine Hives   Ivp Dye [Iodinated Contrast Media] Hives   Metformin And Related Itching   Nizoral [Ketoconazole] Other (See Comments)    Dizzy/ Fainting   Sulfa Antibiotics Hives   Voltaren [Diclofenac Sodium] Diarrhea and Nausea And Vomiting    Outpatient Medications Prior to Visit  Medication Sig   aspirin  EC 81 MG tablet Take 1 tablet (81 mg total) by mouth daily.   atorvastatin  (LIPITOR) 80 MG tablet Take 1 tablet (80 mg total) by mouth daily.   Blood Glucose Calibration (TRUE METRIX LEVEL 1) Low SOLN    Budeson-Glycopyrrol-Formoterol (BREZTRI  AEROSPHERE) 160-9-4.8 MCG/ACT AERO Inhale 2 puffs into the lungs in the morning and at bedtime.   citalopram  (CELEXA ) 10 MG tablet Take 1 tablet (10 mg total) by mouth daily.   clopidogrel  (PLAVIX ) 75 MG tablet Take 1 tablet (75 mg total) by mouth daily.   dapagliflozin  propanediol (FARXIGA ) 10 MG TABS tablet Take 1 tablet (10 mg total) by mouth daily.   DULoxetine  (CYMBALTA ) 60 MG capsule Take 1 capsule (60 mg total) by mouth daily.   ergocalciferol  (VITAMIN D2) 1.25 MG (50000 UT) capsule Take 1 capsule (50,000 Units total) by mouth once a week.   famotidine  (PEPCID ) 40 MG tablet Take 1 tablet (40 mg total) by mouth daily.   fluticasone  (FLONASE ) 50 MCG/ACT nasal spray PLACE 1 SPRAY IN EACH NOSTRIL ONCE DAILYAS NEEDED FOR ALLERGIES   gabapentin  (NEURONTIN ) 300 MG capsule Take 1-2 capsules (300-600 mg total) by mouth at bedtime as needed (pain-pt takes 1-2 capsules at bedtime PRN).   LANTUS  SOLOSTAR 100 UNIT/ML  Solostar Pen INJECT 35 UNITS SUBCUTANEOUSLY AT BEDTIME   losartan  (COZAAR ) 100 MG tablet Take 1 tablet (100 mg total) by mouth daily.   Nebivolol  HCl 20 MG TABS Take 1 tablet (20 mg total) by mouth daily.   ofloxacin (OCUFLOX) 0.3 % ophthalmic solution Place 1 drop into both eyes 4 (four) times daily.   omeprazole  (PRILOSEC) 40 MG capsule Take 1 capsule (40 mg total) by mouth at bedtime.   topiramate  (TOPAMAX ) 25 MG tablet Take 1 tablet (25 mg total) by mouth at bedtime.   traZODone  (DESYREL ) 50 MG tablet Take 1 tablet (50 mg total) by mouth at bedtime.   [DISCONTINUED]  amLODipine  (NORVASC ) 5 MG tablet Take 1 tablet (5 mg total) by mouth daily.   acetaminophen  (TYLENOL ) 650 MG CR tablet Take 650 mg by mouth every 8 (eight) hours as needed for pain. (Patient not taking: Reported on 05/23/2024)   chlorthalidone  (HYGROTON ) 25 MG tablet Take 1 tablet (25 mg total) by mouth daily. (Patient not taking: Reported on 05/23/2024)   dexlansoprazole  (DEXILANT ) 60 MG capsule Take 1 capsule (60 mg total) by mouth daily. (Patient not taking: Reported on 05/23/2024)   diltiazem  (DILACOR XR ) 180 MG 24 hr capsule Take 2 capsules (360 mg total) by mouth at bedtime. (Patient not taking: Reported on 05/23/2024)   docusate sodium  (STOOL SOFTENER) 100 MG capsule Take 1 capsule (100 mg total) by mouth daily. (Patient not taking: Reported on 05/23/2024)   dorzolamide-timolol (COSOPT) 22.3-6.8 MG/ML ophthalmic solution Place 1 drop into both eyes 2 (two) times daily. (Patient not taking: Reported on 05/23/2024)   glucose blood (PRODIGY NO CODING BLOOD GLUC) test strip Use as instructed (Patient not taking: Reported on 05/23/2024)   phenazopyridine  (PYRIDIUM ) 100 MG tablet Take 1 tablet (100 mg total) by mouth 3 (three) times daily as needed for pain. (Patient not taking: Reported on 05/23/2024)   Suvorexant (BELSOMRA) 10 MG TABS Take 10 mg by mouth at bedtime as needed. (Patient not taking: Reported on 05/23/2024)   No  facility-administered medications prior to visit.    Review of Systems  Constitutional: Negative.   HENT: Negative.    Eyes: Negative.   Respiratory: Negative.  Negative for shortness of breath.   Cardiovascular: Negative.  Negative for chest pain.  Gastrointestinal: Negative.  Negative for abdominal pain, constipation and diarrhea.  Genitourinary: Negative.   Musculoskeletal:  Negative for joint pain and myalgias.  Skin: Negative.   Neurological:  Positive for dizziness and headaches.  Endo/Heme/Allergies: Negative.   All other systems reviewed and are negative.      Objective:   BP 135/85   Pulse (!) 59   Ht 5' 1 (1.549 m)   Wt 186 lb 9.6 oz (84.6 kg)   SpO2 99%   BMI 35.26 kg/m   Vitals:   05/23/24 1325  BP: 135/85  Pulse: (!) 59  Height: 5' 1 (1.549 m)  Weight: 186 lb 9.6 oz (84.6 kg)  SpO2: 99%  BMI (Calculated): 35.28    Physical Exam Vitals and nursing note reviewed.  Constitutional:      Appearance: Normal appearance. She is normal weight.  HENT:     Head: Normocephalic and atraumatic.     Nose: Nose normal.     Mouth/Throat:     Mouth: Mucous membranes are moist.  Eyes:     Extraocular Movements: Extraocular movements intact.     Conjunctiva/sclera: Conjunctivae normal.     Pupils: Pupils are equal, round, and reactive to light.  Cardiovascular:     Rate and Rhythm: Normal rate and regular rhythm.     Pulses: Normal pulses.     Heart sounds: Normal heart sounds.  Pulmonary:     Effort: Pulmonary effort is normal.     Breath sounds: Normal breath sounds.  Abdominal:     General: Abdomen is flat. Bowel sounds are normal.     Palpations: Abdomen is soft.  Musculoskeletal:        General: Normal range of motion.     Cervical back: Normal range of motion.  Skin:    General: Skin is warm and dry.  Neurological:     General: No  focal deficit present.     Mental Status: She is alert and oriented to person, place, and time.  Psychiatric:         Mood and Affect: Mood normal.        Behavior: Behavior normal.        Thought Content: Thought content normal.        Judgment: Judgment normal.      No results found for any visits on 05/23/24.  Recent Results (from the past 2160 hours)  Troponin I (High Sensitivity)     Status: None   Collection Time: 04/16/24 12:26 PM  Result Value Ref Range   Troponin I (High Sensitivity) 3 <18 ng/L    Comment: (NOTE) Elevated high sensitivity troponin I (hsTnI) values and significant  changes across serial measurements may suggest ACS but many other  chronic and acute conditions are known to elevate hsTnI results.  Refer to the Links section for chest pain algorithms and additional  guidance. Performed at [facility], [address] [place], [place] [address]   CBC with Differential     Status: Abnormal   Collection Time: 04/16/24 12:26 PM  Result Value Ref Range   WBC 7.7 4.0 - 10.5 K/uL   RBC 5.33 (H) 3.87 - 5.11 MIL/uL   Hemoglobin 14.5 12.0 - 15.0 g/dL   HCT 57.5 63.9 - 53.9 %   MCV 79.5 (L) 80.0 - 100.0 fL   MCH 27.2 26.0 - 34.0 pg   MCHC 34.2 30.0 - 36.0 g/dL   RDW 85.9 88.4 - 84.4 %   Platelets 360 150 - 400 K/uL   nRBC 0.0 0.0 - 0.2 %   Neutrophils Relative % 38 %   Neutro Abs 2.9 1.7 - 7.7 K/uL   Lymphocytes Relative 54 %   Lymphs Abs 4.2 (H) 0.7 - 4.0 K/uL   Monocytes Relative 5 %   Monocytes Absolute 0.4 0.1 - 1.0 K/uL   Eosinophils Relative 2 %   Eosinophils Absolute 0.1 0.0 - 0.5 K/uL   Basophils Relative 1 %   Basophils Absolute 0.1 0.0 - 0.1 K/uL   Immature Granulocytes 0 %   Abs Immature Granulocytes 0.01 0.00 - 0.07 K/uL    Comment: Performed at [facility], [address] [place], [place] [address]  Urinalysis, w/ Reflex to Culture (Infection Suspected) -Urine, Clean Catch     Status: Abnormal   Collection Time: 04/16/24 12:26 PM  Result Value Ref Range   Specimen Source URINE, CLEAN CATCH    Color, Urine STRAW (A) YELLOW    APPearance CLEAR (A) CLEAR   Specific Gravity, Urine 1.003 (L) 1.005 - 1.030   pH 7.0 5.0 - 8.0   Glucose, UA 50 (A) NEGATIVE mg/dL   Hgb urine dipstick SMALL (A) NEGATIVE   Bilirubin Urine NEGATIVE NEGATIVE   Ketones, ur NEGATIVE NEGATIVE mg/dL   Protein, ur >=699 (A) NEGATIVE mg/dL   Nitrite NEGATIVE NEGATIVE   Leukocytes,Ua NEGATIVE NEGATIVE   RBC / HPF 0-5 0 - 5 RBC/hpf   WBC, UA 0-5 0 - 5 WBC/hpf    Comment:        Reflex urine culture not performed if WBC <=10, OR if Squamous epithelial cells >5. If Squamous epithelial cells >5 suggest recollection.    Bacteria, UA RARE (A) NONE SEEN   Squamous Epithelial / HPF 0-5 0 - 5 /HPF    Comment: Performed at [facility], [address] [place], [place] [address]  Comprehensive metabolic  panel with GFR     Status: Abnormal   Collection Time: 04/16/24 12:48 PM  Result Value Ref Range   Sodium 141 135 - 145 mmol/L   Potassium 3.1 (L) 3.5 - 5.1 mmol/L   Chloride 107 98 - 111 mmol/L   CO2 23 22 - 32 mmol/L   Glucose, Bld 160 (H) 70 - 99 mg/dL    Comment: Glucose reference range applies only to samples taken after fasting for at least 8 hours.   BUN 9 8 - 23 mg/dL   Creatinine, Ser 8.87 (H) 0.44 - 1.00 mg/dL   Calcium  9.0 8.9 - 10.3 mg/dL   Total Protein 7.0 6.5 - 8.1 g/dL   Albumin 3.5 3.5 - 5.0 g/dL   AST 18 15 - 41 U/L   ALT 11 0 - 44 U/L   Alkaline Phosphatase 68 38 - 126 U/L   Total Bilirubin 0.7 0.0 - 1.2 mg/dL   GFR, Estimated 55 (L) >60 mL/min    Comment: (NOTE) Calculated using the CKD-EPI Creatinine Equation (2021)    Anion gap 11 5 - 15    Comment: Performed at [facility], [address] [place], [place] [address]  Troponin I (High Sensitivity)     Status: None   Collection Time: 04/16/24  3:15 PM  Result Value Ref Range   Troponin I (High Sensitivity) 3 <18 ng/L    Comment: (NOTE) Elevated high sensitivity troponin I (hsTnI) values and significant  changes across serial measurements may  suggest ACS but many other  chronic and acute conditions are known to elevate hsTnI results.  Refer to the Links section for chest pain algorithms and additional  guidance. Performed at [facility], [address] [place], [place] [address]   [id number]     Status: Abnormal   Collection Time: 04/25/24  2:33 PM  Result Value Ref Range   Glucose 142 (H) 70 - 99 mg/dL   BUN 11 8 - 27 mg/dL   Creatinine, Ser 8.85 (H) 0.57 - 1.00 mg/dL   eGFR 54 (L) >40 fO/fpw/8.26   BUN/Creatinine Ratio 10 (L) 12 - 28   Sodium 140 134 - 144 mmol/L   Potassium 4.0 3.5 - 5.2 mmol/L   Chloride 103 96 - 106 mmol/L   CO2 23 20 - 29 mmol/L   Calcium  9.6 8.7 - 10.3 mg/dL   Total Protein 6.8 6.0 - 8.5 g/dL   Albumin 4.0 3.9 - 4.9 g/dL   Globulin, Total 2.8 1.5 - 4.5 g/dL   Bilirubin Total 0.3 0.0 - 1.2 mg/dL   Alkaline Phosphatase 92 44 - 121 IU/L   AST 8 0 - 40 IU/L   ALT 10 0 - 32 IU/L  Lipid Profile     Status: Abnormal   Collection Time: 04/25/24  2:33 PM  Result Value Ref Range   Cholesterol, Total 209 (H) 100 - 199 mg/dL   Triglycerides 867 0 - 149 mg/dL   HDL 43 >60 mg/dL   VLDL Cholesterol Cal 24 5 - 40 mg/dL   LDL Chol Calc (NIH) 857 (H) 0 - 99 mg/dL   Chol/HDL Ratio 4.9 (H) 0.0 - 4.4 ratio    Comment:                                   T. Chol/HDL Ratio  Men  Women                               1/2 Avg.Risk  3.4    3.3                                   Avg.Risk  5.0    4.4                                2X Avg.Risk  9.6    7.1                                3X Avg.Risk 23.4   11.0   Hemoglobin A1c     Status: Abnormal   Collection Time: 04/25/24  2:33 PM  Result Value Ref Range   Hgb A1c MFr Bld 7.5 (H) 4.8 - 5.6 %    Comment:          Prediabetes: 5.7 - 6.4          Diabetes: >6.4          Glycemic control for adults with diabetes: <7.0    Est. average glucose Bld gHb Est-mCnc 169 mg/dL  TSH     Status: None   Collection Time:  04/25/24  2:33 PM  Result Value Ref Range   TSH 1.760 0.450 - 4.500 uIU/mL  Comprehensive metabolic panel     Status: Abnormal   Collection Time: 05/13/24 10:20 PM  Result Value Ref Range   Sodium 142 135 - 145 mmol/L   Potassium 2.6 (LL) 3.5 - 5.1 mmol/L    Comment: CRITICAL RESULT CALLED TO, READ BACK BY AND VERIFIED WITH carlotta Code @2252  on 05/13/24 skl    Chloride 106 98 - 111 mmol/L   CO2 26 22 - 32 mmol/L   Glucose, Bld 98 70 - 99 mg/dL    Comment: Glucose reference range applies only to samples taken after fasting for at least 8 hours.   BUN 9 8 - 23 mg/dL   Creatinine, Ser 8.77 (H) 0.44 - 1.00 mg/dL   Calcium  9.3 8.9 - 10.3 mg/dL   Total Protein 7.3 6.5 - 8.1 g/dL   Albumin 3.6 3.5 - 5.0 g/dL   AST 18 15 - 41 U/L   ALT 13 0 - 44 U/L   Alkaline Phosphatase 79 38 - 126 U/L   Total Bilirubin 0.6 0.0 - 1.2 mg/dL   GFR, Estimated 50 (L) >60 mL/min    Comment: (NOTE) Calculated using the CKD-EPI Creatinine Equation (2021)    Anion gap 10 5 - 15    Comment: Performed at [facility], [address] [place], [place] [address]  CBC     Status: Abnormal   Collection Time: 05/13/24 10:20 PM  Result Value Ref Range   WBC 13.0 (H) 4.0 - 10.5 K/uL   RBC 5.28 (H) 3.87 - 5.11 MIL/uL   Hemoglobin 14.3 12.0 - 15.0 g/dL   HCT 57.2 63.9 - 53.9 %   MCV 80.9 80.0 - 100.0 fL   MCH 27.1 26.0 - 34.0 pg   MCHC 33.5 30.0 - 36.0 g/dL   RDW 86.0 88.4 - 84.4 %   Platelets 421 (H) 150 - 400 K/uL  nRBC 0.0 0.0 - 0.2 %    Comment: Performed at [facility], [address] [place], [place] [address]  Troponin I (High Sensitivity)     Status: None   Collection Time: 05/13/24 10:20 PM  Result Value Ref Range   Troponin I (High Sensitivity) <2 <18 ng/L    Comment: (NOTE) Elevated high sensitivity troponin I (hsTnI) values and significant  changes across serial measurements may suggest ACS but many other  chronic and acute conditions are known to elevate hsTnI results.   Refer to the Links section for chest pain algorithms and additional  guidance. Performed at [facility], [address] [place], [place] [address]   Urinalysis, Routine w reflex microscopic -Urine, Clean Catch     Status: Abnormal   Collection Time: 05/14/24  3:15 AM  Result Value Ref Range   Color, Urine YELLOW (A) YELLOW   APPearance HAZY (A) CLEAR   Specific Gravity, Urine 1.004 (L) 1.005 - 1.030   pH 5.0 5.0 - 8.0   Glucose, UA 50 (A) NEGATIVE mg/dL   Hgb urine dipstick SMALL (A) NEGATIVE   Bilirubin Urine NEGATIVE NEGATIVE   Ketones, ur NEGATIVE NEGATIVE mg/dL   Protein, ur >=699 (A) NEGATIVE mg/dL   Nitrite NEGATIVE NEGATIVE   Leukocytes,Ua NEGATIVE NEGATIVE   RBC / HPF 0-5 0 - 5 RBC/hpf   WBC, UA 6-10 0 - 5 WBC/hpf   Bacteria, UA FEW (A) NONE SEEN   Squamous Epithelial / HPF 0-5 0 - 5 /HPF   Mucus PRESENT    Budding Yeast PRESENT     Comment: Performed at [facility], [address] [place], [place] [address]      Assessment & Plan:  Increase amlodipine  to 10 mg daily Keep appointment with vein and vascular  Problem List Items Addressed This Visit       Cardiovascular and Mediastinum   Essential hypertension, benign - Primary   Relevant Medications   amLODipine  (NORVASC ) 10 MG tablet    Return in about 2 months (around 07/24/2024).   Total time spent: 25 minutes  Google, NP  05/23/2024   This document may have been prepared by Dragon Voice Recognition software and as such may include unintentional dictation errors.

## 2024-06-10 ENCOUNTER — Other Ambulatory Visit (INDEPENDENT_AMBULATORY_CARE_PROVIDER_SITE_OTHER): Payer: Self-pay | Admitting: Nurse Practitioner

## 2024-06-10 DIAGNOSIS — I6523 Occlusion and stenosis of bilateral carotid arteries: Secondary | ICD-10-CM

## 2024-06-12 ENCOUNTER — Ambulatory Visit (INDEPENDENT_AMBULATORY_CARE_PROVIDER_SITE_OTHER): Payer: Self-pay

## 2024-06-12 ENCOUNTER — Encounter (INDEPENDENT_AMBULATORY_CARE_PROVIDER_SITE_OTHER): Payer: Self-pay | Admitting: Vascular Surgery

## 2024-06-12 ENCOUNTER — Ambulatory Visit (INDEPENDENT_AMBULATORY_CARE_PROVIDER_SITE_OTHER): Payer: Self-pay | Admitting: Vascular Surgery

## 2024-06-12 VITALS — BP 163/63 | HR 60 | Ht 62.0 in | Wt 182.6 lb

## 2024-06-12 DIAGNOSIS — Z794 Long term (current) use of insulin: Secondary | ICD-10-CM | POA: Diagnosis not present

## 2024-06-12 DIAGNOSIS — E785 Hyperlipidemia, unspecified: Secondary | ICD-10-CM

## 2024-06-12 DIAGNOSIS — E119 Type 2 diabetes mellitus without complications: Secondary | ICD-10-CM

## 2024-06-12 DIAGNOSIS — I10 Essential (primary) hypertension: Secondary | ICD-10-CM

## 2024-06-12 DIAGNOSIS — I6523 Occlusion and stenosis of bilateral carotid arteries: Secondary | ICD-10-CM | POA: Diagnosis not present

## 2024-06-12 NOTE — Progress Notes (Signed)
 Subjective:    Patient ID: Laura Camacho, female    DOB: 23-Oct-1961, 63 y.o.   MRN: [id number] Chief Complaint  Patient presents with   Follow-up    LS 07/15/22 Bilateral Carotid + consult Ref. [place]    Laura  Camacho is a 63 yo female who presents to clinic today for a 1 year follow-up of carotid stenosis.  Today she denies any signs or symptoms related to CVA or TIA.  She denies any amaurosis fugax.  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  Endorses she does see an eye doctor for this.  She also states that she does have drops for her eyes.  Previously she is noted to have right ICA proximal mid and distal occlusion.  Her right ECA also has a greater than 50% stenosis.  Previously her left carotid artery has been listed as having a 1 to 39% blockage.  Patient underwent bilateral carotid ultrasounds today.  The support that she has had consistent evidence of a total occlusion of her right ICA with the ECA appearing to have greater than 50% stenosis.  Her left carotid artery ICA is consistent with a 1 to 39% stenosis.  Bilateral vertebral arteries demonstrate antegrade flow.    Review of Systems  Constitutional: Negative.   Eyes:  Positive for visual disturbance.       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      Objective:   Physical Exam Constitutional:      Appearance: Normal appearance. She is obese.  HENT:     Head: Normocephalic.  Eyes:     Pupils: Pupils are equal, round, and reactive to light.     Comments: Patient noted to be blind in her right eye  Cardiovascular:     Rate and Rhythm: Normal rate and regular rhythm.     Pulses: Normal pulses.     Heart sounds: Normal heart sounds.  Pulmonary:     Effort: Pulmonary effort is normal.     Breath sounds: Normal breath sounds.  Abdominal:     General: Bowel sounds are normal.     Palpations:  Abdomen is soft.  Musculoskeletal:        General: Normal range of motion.  Skin:    General: Skin is warm and dry.     Capillary Refill: Capillary refill takes 2 to 3 seconds.  Neurological:     General: No focal deficit present.     Mental Status: She is alert and oriented to person, place, and time. Mental status is at baseline.  Psychiatric:        Mood and Affect: Mood normal.        Behavior: Behavior normal.        Thought Content: Thought content normal.        Judgment: Judgment normal.     BP (!) 163/63 (BP Location: Left Arm, Patient Position: Sitting, Cuff Size: Normal)   Pulse 60   Ht 5' 2 (1.575 m)   Wt 182 lb 9.6 oz (82.8 kg)   BMI 33.40 kg/m   Past Medical History:  Diagnosis Date   Complication of anesthesia    STATES WAS TOLD NOT TO TAKE UNKNOWN ANESTHESIA AFTER NECKFUSION OR HYSTERECTOMY   Diabetes mellitus without complication (HCC)    Dyslipidemia 05/05/2017   Epilepsy (HCC)    Fibromyalgia  GERD (gastroesophageal reflux disease)    Glaucoma (increased eye pressure)    History of Glaucoma but no drops   Headache    Hyperlipidemia    Hypertension    Neurofibromatosis (HCC)    Pyelonephritis     Social History   Socioeconomic History   Marital status: Single    Spouse name: Not on file   Number of children: Not on file   Years of education: Not on file   Highest education level: Not on file  Occupational History   Not on file  Tobacco Use   Smoking status: Every Day    Current packs/day: 0.50    Types: Cigarettes   Smokeless tobacco: Never   Tobacco comments:    0.5 ppd 06/09/2021  Vaping Use   Vaping status: Never Used  Substance and Sexual Activity   Alcohol use: Yes    Alcohol/week: 3.0 - 4.0 standard drinks of alcohol    Types: 3 - 4 Shots of liquor per week    Comment: once a week   Drug use: Not Currently    Types: Marijuana    Comment: monthly   Sexual activity: Yes    Birth control/protection: Surgical  Other Topics  Concern   Not on file  Social History Narrative   Not on file   Social Drivers of Health   Financial Resource Strain: Not on file  Food Insecurity: Not on file  Transportation Needs: Not on file  Physical Activity: Not on file  Stress: Not on file  Social Connections: Not on file  Intimate Partner Violence: Not on file    Past Surgical History:  Procedure Laterality Date   ABDOMINAL HYSTERECTOMY     has both ovaries- [facility]   Bilateral Arm Surgery Bilateral    'tumors' removed   BREAST BIOPSY Left 11/05/2018   us  core venus clip  BIOPSY: GRANULAR CELL TUMOR, 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   BREAST BIOPSY Left 11/05/2018   us  core axilla hydro marker  NEGATIVE FOR METASTATIC CARCINOMA   CERVICAL FUSION     COLON SURGERY     COLONOSCOPY N/A 07/27/2020   Procedure: COLONOSCOPY;  Surgeon: Maryruth Ole DASEN, MD;  Location: [facility] ENDOSCOPY;  Service: Endoscopy;  Laterality: N/A;   COLONOSCOPY WITH PROPOFOL  N/A 02/12/2016   Procedure: COLONOSCOPY WITH PROPOFOL ;  Surgeon: Donnice Vaughn Manes, MD;  Location: [facility] ENDOSCOPY;  Service: Endoscopy;  Laterality: N/A;   COLONOSCOPY WITH PROPOFOL  N/A 06/30/2020   Procedure: COLONOSCOPY WITH PROPOFOL ;  Surgeon: Maryruth Ole DASEN, MD;  Location: [facility] ENDOSCOPY;  Service: Endoscopy;  Laterality: N/A;   ESOPHAGOGASTRODUODENOSCOPY (EGD) WITH PROPOFOL  N/A 02/12/2016   Procedure: ESOPHAGOGASTRODUODENOSCOPY (EGD) WITH PROPOFOL ;  Surgeon: Donnice Vaughn Manes, MD;  Location: [facility] ENDOSCOPY;  Service: Endoscopy;  Laterality: N/A;   ESOPHAGOGASTRODUODENOSCOPY (EGD) WITH PROPOFOL  N/A 06/30/2020   Procedure: ESOPHAGOGASTRODUODENOSCOPY (EGD) WITH PROPOFOL ;  Surgeon: Maryruth Ole DASEN, MD;  Location: [facility] ENDOSCOPY;  Service: Endoscopy;  Laterality: N/A;   VULVECTOMY N/A 06/26/2017   Procedure: WIDE EXCISION VULVECTOMY;  Surgeon: Kathe Gladis LABOR, MD;  Location: [facility] ORS;  Service: Gynecology;  Laterality: N/A;    Family History  Problem Relation Age of  Onset   Diabetes Mother    Diabetes Father    Diabetes Maternal Aunt    Diabetes Maternal Uncle    Breast cancer Cousin    Ovarian cancer Neg Hx    Colon cancer Neg Hx     Allergies  Allergen Reactions   Codeine Anaphylaxis  Throat swells up   Bupropion     Unknown   Cefuroxime Rash   Ciprofloxacin Rash   Fluconazole Itching   Ibuprofen  Itching   Iodine Hives   Ivp Dye [Iodinated Contrast Media] Hives   Metformin And Related Itching   Nizoral [Ketoconazole] Other (See Comments)    Dizzy/ Fainting   Sulfa Antibiotics Hives   Voltaren [Diclofenac Sodium] Diarrhea and Nausea And Vomiting       Latest Ref Rng & Units 05/13/2024   10:20 PM 04/16/2024   12:26 PM 05/31/2021   11:49 AM  CBC  WBC 4.0 - 10.5 K/uL 13.0  7.7  12.2   Hemoglobin 12.0 - 15.0 g/dL 85.6  85.4  85.6   Hematocrit 36.0 - 46.0 % 42.7  42.4  43.6   Platelets 150 - 400 K/uL 421  360  390       CMP     Component Value Date/Time   NA 142 05/13/2024 2220   NA 140 04/25/2024 1433   K 2.6 (LL) 05/13/2024 2220   CL 106 05/13/2024 2220   CO2 26 05/13/2024 2220   GLUCOSE 98 05/13/2024 2220   BUN 9 05/13/2024 2220   BUN 11 04/25/2024 1433   CREATININE 1.22 (H) 05/13/2024 2220   CALCIUM  9.3 05/13/2024 2220   PROT 7.3 05/13/2024 2220   PROT 6.8 04/25/2024 1433   ALBUMIN 3.6 05/13/2024 2220   ALBUMIN 4.0 04/25/2024 1433   AST 18 05/13/2024 2220   ALT 13 05/13/2024 2220   ALKPHOS 79 05/13/2024 2220   BILITOT 0.6 05/13/2024 2220   BILITOT 0.3 04/25/2024 1433   EGFR 54 (L) 04/25/2024 1433   GFRNONAA 50 (L) 05/13/2024 2220     No results found.     Assessment & Plan:   1. Bilateral carotid artery stenosis (Primary) Patient presents to clinic for 1 year follow-up of bilateral carotid today.  She is noted to have a continued right ICA proximal mid and distal occlusion with a greater than 50% ECA.  On the left side she is noted to have a 1 to 39% stenosis.  These are no changes from previous.   Patient denies any signs or symptoms of CVA or TIA or amaurosis fugax.  She does however have a history of right eye blindness which has not changed in the past year.  She has no further noted weakness or hemiplegia.  Patient states she is doing well overall.  Patient will follow-up in 1 year with bilateral carotid ultrasounds.  2. Primary hypertension Continue antihypertensive medications as already ordered, these medications have been reviewed and there are no changes at this time.  3. Insulin  dependent type 2 diabetes mellitus (HCC) Continue hypoglycemic medications as already ordered, these medications have been reviewed and there are no changes at this time.  Hgb A1C to be monitored as already arranged by primary service  4. Hyperlipidemia, unspecified hyperlipidemia type Continue statin as ordered and reviewed, no changes at this time   Current Outpatient Medications on File Prior to Visit  Medication Sig Dispense Refill   acetaminophen  (TYLENOL ) 650 MG CR tablet Take 650 mg by mouth every 8 (eight) hours as needed for pain.     amLODipine  (NORVASC ) 10 MG tablet Take 1 tablet (10 mg total) by mouth daily. 30 tablet 5   aspirin  EC 81 MG tablet Take 1 tablet (81 mg total) by mouth daily. 90 tablet 3   atorvastatin  (LIPITOR) 80 MG tablet Take 1 tablet (80 mg total) by  mouth daily. 90 tablet 3   Blood Glucose Calibration (TRUE METRIX LEVEL 1) Low SOLN      Budeson-Glycopyrrol-Formoterol (BREZTRI  AEROSPHERE) 160-9-4.8 MCG/ACT AERO Inhale 2 puffs into the lungs in the morning and at bedtime. 10.7 g 5   chlorthalidone  (HYGROTON ) 25 MG tablet Take 1 tablet (25 mg total) by mouth daily. 90 tablet 3   citalopram  (CELEXA ) 10 MG tablet Take 1 tablet (10 mg total) by mouth daily. 90 tablet 3   clopidogrel  (PLAVIX ) 75 MG tablet Take 1 tablet (75 mg total) by mouth daily. 90 tablet 3   dapagliflozin  propanediol (FARXIGA ) 10 MG TABS tablet Take 1 tablet (10 mg total) by mouth daily. 90 tablet 3    dexlansoprazole  (DEXILANT ) 60 MG capsule Take 1 capsule (60 mg total) by mouth daily. 90 capsule 3   diltiazem  (DILACOR XR ) 180 MG 24 hr capsule Take 2 capsules (360 mg total) by mouth at bedtime. 90 capsule 3   docusate sodium  (STOOL SOFTENER) 100 MG capsule Take 1 capsule (100 mg total) by mouth daily. 90 capsule 3   dorzolamide-timolol (COSOPT) 22.3-6.8 MG/ML ophthalmic solution Place 1 drop into both eyes 2 (two) times daily.     DULoxetine  (CYMBALTA ) 60 MG capsule Take 1 capsule (60 mg total) by mouth daily. 90 capsule 3   ergocalciferol  (VITAMIN D2) 1.25 MG (50000 UT) capsule Take 1 capsule (50,000 Units total) by mouth once a week. 12 capsule 3   famotidine  (PEPCID ) 40 MG tablet Take 1 tablet (40 mg total) by mouth daily. 90 tablet 3   fluticasone  (FLONASE ) 50 MCG/ACT nasal spray PLACE 1 SPRAY IN EACH NOSTRIL ONCE DAILYAS NEEDED FOR ALLERGIES 16 g 3   gabapentin  (NEURONTIN ) 300 MG capsule Take 1-2 capsules (300-600 mg total) by mouth at bedtime as needed (pain-pt takes 1-2 capsules at bedtime PRN). 90 capsule 3   glucose blood (PRODIGY NO CODING BLOOD GLUC) test strip Use as instructed 100 each 2   LANTUS  SOLOSTAR 100 UNIT/ML Solostar Pen INJECT 35 UNITS SUBCUTANEOUSLY AT BEDTIME 15 mL 7   losartan  (COZAAR ) 100 MG tablet Take 1 tablet (100 mg total) by mouth daily. 90 tablet 3   Nebivolol  HCl 20 MG TABS Take 1 tablet (20 mg total) by mouth daily. 90 tablet 3   ofloxacin (OCUFLOX) 0.3 % ophthalmic solution Place 1 drop into both eyes 4 (four) times daily.     omeprazole  (PRILOSEC) 40 MG capsule Take 1 capsule (40 mg total) by mouth at bedtime. 90 capsule 3   phenazopyridine  (PYRIDIUM ) 100 MG tablet Take 1 tablet (100 mg total) by mouth 3 (three) times daily as needed for pain. 20 tablet 0   Suvorexant (BELSOMRA) 10 MG TABS Take 10 mg by mouth at bedtime as needed.     topiramate  (TOPAMAX ) 25 MG tablet Take 1 tablet (25 mg total) by mouth at bedtime. 30 tablet 2   traZODone  (DESYREL ) 50 MG  tablet Take 1 tablet (50 mg total) by mouth at bedtime. 90 tablet 3   No current facility-administered medications on file prior to visit.    There are no Patient Instructions on file for this visit. No follow-ups on file.   Gwendlyn JONELLE Shank, NP

## 2024-07-25 ENCOUNTER — Ambulatory Visit: Admitting: Cardiology

## 2024-08-22 ENCOUNTER — Ambulatory Visit (INDEPENDENT_AMBULATORY_CARE_PROVIDER_SITE_OTHER): Admitting: Podiatry

## 2024-08-22 DIAGNOSIS — B351 Tinea unguium: Secondary | ICD-10-CM

## 2024-08-22 DIAGNOSIS — M79674 Pain in right toe(s): Secondary | ICD-10-CM

## 2024-08-22 DIAGNOSIS — M79675 Pain in left toe(s): Secondary | ICD-10-CM

## 2024-08-22 NOTE — Progress Notes (Signed)
  Subjective:  Patient ID: Laura  [name], female    DOB: February 12, 1961,  MRN: [id number]  Chief Complaint  Patient presents with   Nail Problem    Nail trim    63 y.o. female returns for the above complaint.  Patient presents with thickened onychodystrophy mycotic toenails x 10 mild pain on palpation hurts with ambulation worse with pressure patient would like to discuss treatment options for this pain scale 7 out of 10 dull aching nature  Objective:  There were no vitals filed for this visit. Podiatric Exam: Vascular: dorsalis pedis and posterior tibial pulses are palpable bilateral. Capillary return is immediate. Temperature gradient is WNL. Skin turgor WNL  Sensorium: Normal Semmes Weinstein monofilament test. Normal tactile sensation bilaterally. Nail Exam: Pt has thick disfigured discolored nails with subungual debris noted bilateral entire nail hallux through fifth toenails.  Pain on palpation to the nails. Ulcer Exam: There is no evidence of ulcer or pre-ulcerative changes or infection. Orthopedic Exam: Muscle tone and strength are WNL. No limitations in general ROM. No crepitus or effusions noted.  Skin: No Porokeratosis. No infection or ulcers    Assessment & Plan:   1. Pain due to onychomycosis of toenails of both feet     Patient was evaluated and treated and all questions answered.  Onychomycosis with pain  -Nails palliatively debrided as below. -Educated on self-care  Procedure: Nail Debridement Rationale: pain  Type of Debridement: manual, sharp debridement. Instrumentation: Nail nipper, rotary burr. Number of Nails: 10  Procedures and Treatment: Consent by patient was obtained for treatment procedures. The patient understood the discussion of treatment and procedures well. All questions were answered thoroughly reviewed. Debridement of mycotic and hypertrophic toenails, 1 through 5 bilateral and clearing of subungual debris. No ulceration, no infection noted.   Return Visit-Office Procedure: Patient instructed to return to the office for a follow up visit 3 months for continued evaluation and treatment.  Franky Blanch, DPM    Return in about 3 months (around 11/22/2024) for [facility].

## 2024-09-06 ENCOUNTER — Encounter: Admission: EM | Disposition: A | Payer: Self-pay | Source: Home / Self Care | Attending: Family Medicine

## 2024-09-06 ENCOUNTER — Emergency Department

## 2024-09-06 ENCOUNTER — Inpatient Hospital Stay
Admission: EM | Admit: 2024-09-06 | Discharge: 2024-09-09 | DRG: 322 | Disposition: A | Discharge status: Home / Self Care | Attending: Osteopathic Medicine | Admitting: Osteopathic Medicine

## 2024-09-06 ENCOUNTER — Other Ambulatory Visit: Payer: Self-pay

## 2024-09-06 DIAGNOSIS — R0989 Other specified symptoms and signs involving the circulatory and respiratory systems: Secondary | ICD-10-CM | POA: Diagnosis not present

## 2024-09-06 DIAGNOSIS — Z5941 Food insecurity: Secondary | ICD-10-CM

## 2024-09-06 DIAGNOSIS — Z743 Need for continuous supervision: Secondary | ICD-10-CM | POA: Diagnosis not present

## 2024-09-06 DIAGNOSIS — E876 Hypokalemia: Secondary | ICD-10-CM | POA: Diagnosis not present

## 2024-09-06 DIAGNOSIS — E66812 Obesity, class 2: Secondary | ICD-10-CM | POA: Diagnosis not present

## 2024-09-06 DIAGNOSIS — I472 Ventricular tachycardia, unspecified: Secondary | ICD-10-CM | POA: Diagnosis not present

## 2024-09-06 DIAGNOSIS — E1142 Type 2 diabetes mellitus with diabetic polyneuropathy: Secondary | ICD-10-CM | POA: Diagnosis not present

## 2024-09-06 DIAGNOSIS — I159 Secondary hypertension, unspecified: Secondary | ICD-10-CM | POA: Diagnosis not present

## 2024-09-06 DIAGNOSIS — F419 Anxiety disorder, unspecified: Secondary | ICD-10-CM | POA: Diagnosis not present

## 2024-09-06 DIAGNOSIS — E118 Type 2 diabetes mellitus with unspecified complications: Secondary | ICD-10-CM | POA: Diagnosis not present

## 2024-09-06 DIAGNOSIS — I2510 Atherosclerotic heart disease of native coronary artery without angina pectoris: Secondary | ICD-10-CM | POA: Diagnosis not present

## 2024-09-06 DIAGNOSIS — M797 Fibromyalgia: Secondary | ICD-10-CM | POA: Diagnosis not present

## 2024-09-06 DIAGNOSIS — E7800 Pure hypercholesterolemia, unspecified: Secondary | ICD-10-CM | POA: Diagnosis not present

## 2024-09-06 DIAGNOSIS — E872 Acidosis, unspecified: Secondary | ICD-10-CM | POA: Diagnosis present

## 2024-09-06 DIAGNOSIS — Z881 Allergy status to other antibiotic agents status: Secondary | ICD-10-CM

## 2024-09-06 DIAGNOSIS — E785 Hyperlipidemia, unspecified: Secondary | ICD-10-CM | POA: Diagnosis not present

## 2024-09-06 DIAGNOSIS — F32A Depression, unspecified: Secondary | ICD-10-CM | POA: Diagnosis present

## 2024-09-06 DIAGNOSIS — Z885 Allergy status to narcotic agent status: Secondary | ICD-10-CM

## 2024-09-06 DIAGNOSIS — Z886 Allergy status to analgesic agent status: Secondary | ICD-10-CM

## 2024-09-06 DIAGNOSIS — Z803 Family history of malignant neoplasm of breast: Secondary | ICD-10-CM

## 2024-09-06 DIAGNOSIS — Z833 Family history of diabetes mellitus: Secondary | ICD-10-CM | POA: Diagnosis not present

## 2024-09-06 DIAGNOSIS — Z882 Allergy status to sulfonamides status: Secondary | ICD-10-CM

## 2024-09-06 DIAGNOSIS — I2119 ST elevation (STEMI) myocardial infarction involving other coronary artery of inferior wall: Secondary | ICD-10-CM | POA: Diagnosis not present

## 2024-09-06 DIAGNOSIS — I517 Cardiomegaly: Secondary | ICD-10-CM | POA: Diagnosis not present

## 2024-09-06 DIAGNOSIS — I5032 Chronic diastolic (congestive) heart failure: Secondary | ICD-10-CM | POA: Diagnosis not present

## 2024-09-06 DIAGNOSIS — I493 Ventricular premature depolarization: Secondary | ICD-10-CM | POA: Diagnosis present

## 2024-09-06 DIAGNOSIS — I429 Cardiomyopathy, unspecified: Secondary | ICD-10-CM | POA: Diagnosis not present

## 2024-09-06 DIAGNOSIS — I110 Hypertensive heart disease with heart failure: Secondary | ICD-10-CM | POA: Diagnosis present

## 2024-09-06 DIAGNOSIS — Z7902 Long term (current) use of antithrombotics/antiplatelets: Secondary | ICD-10-CM | POA: Diagnosis not present

## 2024-09-06 DIAGNOSIS — R079 Chest pain, unspecified: Secondary | ICD-10-CM | POA: Diagnosis not present

## 2024-09-06 DIAGNOSIS — Z888 Allergy status to other drugs, medicaments and biological substances status: Secondary | ICD-10-CM

## 2024-09-06 DIAGNOSIS — Z7982 Long term (current) use of aspirin: Secondary | ICD-10-CM | POA: Diagnosis not present

## 2024-09-06 DIAGNOSIS — I959 Hypotension, unspecified: Secondary | ICD-10-CM | POA: Diagnosis present

## 2024-09-06 DIAGNOSIS — I152 Hypertension secondary to endocrine disorders: Secondary | ICD-10-CM | POA: Insufficient documentation

## 2024-09-06 DIAGNOSIS — Z794 Long term (current) use of insulin: Secondary | ICD-10-CM | POA: Diagnosis not present

## 2024-09-06 DIAGNOSIS — I213 ST elevation (STEMI) myocardial infarction of unspecified site: Principal | ICD-10-CM

## 2024-09-06 DIAGNOSIS — K219 Gastro-esophageal reflux disease without esophagitis: Secondary | ICD-10-CM | POA: Diagnosis present

## 2024-09-06 DIAGNOSIS — Z5982 Transportation insecurity: Secondary | ICD-10-CM

## 2024-09-06 DIAGNOSIS — Z91041 Radiographic dye allergy status: Secondary | ICD-10-CM

## 2024-09-06 DIAGNOSIS — I10 Essential (primary) hypertension: Secondary | ICD-10-CM | POA: Diagnosis not present

## 2024-09-06 DIAGNOSIS — Z6836 Body mass index (BMI) 36.0-36.9, adult: Secondary | ICD-10-CM

## 2024-09-06 DIAGNOSIS — G40909 Epilepsy, unspecified, not intractable, without status epilepticus: Secondary | ICD-10-CM | POA: Diagnosis not present

## 2024-09-06 DIAGNOSIS — Z79899 Other long term (current) drug therapy: Secondary | ICD-10-CM | POA: Diagnosis not present

## 2024-09-06 DIAGNOSIS — Z90710 Acquired absence of both cervix and uterus: Secondary | ICD-10-CM

## 2024-09-06 DIAGNOSIS — F17210 Nicotine dependence, cigarettes, uncomplicated: Secondary | ICD-10-CM | POA: Diagnosis present

## 2024-09-06 DIAGNOSIS — J9811 Atelectasis: Secondary | ICD-10-CM | POA: Diagnosis not present

## 2024-09-06 HISTORY — PX: LEFT HEART CATH AND CORONARY ANGIOGRAPHY: CATH118249

## 2024-09-06 HISTORY — PX: CORONARY/GRAFT ACUTE MI REVASCULARIZATION: CATH118305

## 2024-09-06 LAB — GLUCOSE, CAPILLARY
Glucose-Capillary: 243 mg/dL — ABNORMAL HIGH (ref 70–99)
Glucose-Capillary: 266 mg/dL — ABNORMAL HIGH (ref 70–99)

## 2024-09-06 LAB — HEMOGLOBIN A1C
Hgb A1c MFr Bld: 7.5 % — ABNORMAL HIGH (ref 4.8–5.6)
Mean Plasma Glucose: 168.55 mg/dL

## 2024-09-06 LAB — COMPREHENSIVE METABOLIC PANEL WITH GFR
ALT: 12 U/L (ref 0–44)
AST: 19 U/L (ref 15–41)
Albumin: 3.3 g/dL — ABNORMAL LOW (ref 3.5–5.0)
Alkaline Phosphatase: 71 U/L (ref 38–126)
Anion gap: 10 (ref 5–15)
BUN: 7 mg/dL — ABNORMAL LOW (ref 8–23)
CO2: 27 mmol/L (ref 22–32)
Calcium: 8.7 mg/dL — ABNORMAL LOW (ref 8.9–10.3)
Chloride: 102 mmol/L (ref 98–111)
Creatinine, Ser: 1.2 mg/dL — ABNORMAL HIGH (ref 0.44–1.00)
GFR, Estimated: 51 mL/min — ABNORMAL LOW (ref >60)
Glucose, Bld: 200 mg/dL — ABNORMAL HIGH (ref 70–99)
Potassium: 2.8 mmol/L — ABNORMAL LOW (ref 3.5–5.1)
Sodium: 139 mmol/L (ref 135–145)
Total Bilirubin: 0.5 mg/dL (ref 0.0–1.2)
Total Protein: 7.1 g/dL (ref 6.5–8.1)

## 2024-09-06 LAB — LIPID PANEL
Cholesterol: 133 mg/dL (ref 0–200)
HDL: 34 mg/dL — ABNORMAL LOW (ref >40)
LDL Cholesterol: 73 mg/dL (ref 0–99)
Total CHOL/HDL Ratio: 3.9 ratio
Triglycerides: 129 mg/dL (ref <150)
VLDL: 26 mg/dL (ref 0–40)

## 2024-09-06 LAB — CBC WITH DIFFERENTIAL/PLATELET
Abs Immature Granulocytes: 0.03 K/uL (ref 0.00–0.07)
Basophils Absolute: 0.1 K/uL (ref 0.0–0.1)
Basophils Relative: 1 %
Eosinophils Absolute: 0.2 K/uL (ref 0.0–0.5)
Eosinophils Relative: 2 %
HCT: 39.9 % (ref 36.0–46.0)
Hemoglobin: 13.2 g/dL (ref 12.0–15.0)
Immature Granulocytes: 0 %
Lymphocytes Relative: 55 %
Lymphs Abs: 5.8 K/uL — ABNORMAL HIGH (ref 0.7–4.0)
MCH: 27.1 pg (ref 26.0–34.0)
MCHC: 33.1 g/dL (ref 30.0–36.0)
MCV: 81.9 fL (ref 80.0–100.0)
Monocytes Absolute: 0.6 K/uL (ref 0.1–1.0)
Monocytes Relative: 5 %
Neutro Abs: 3.9 K/uL (ref 1.7–7.7)
Neutrophils Relative %: 37 %
Platelets: 411 K/uL — ABNORMAL HIGH (ref 150–400)
RBC: 4.87 MIL/uL (ref 3.87–5.11)
RDW: 14.8 % (ref 11.5–15.5)
WBC: 10.6 K/uL — ABNORMAL HIGH (ref 4.0–10.5)
nRBC: 0 % (ref 0.0–0.2)

## 2024-09-06 LAB — LACTIC ACID, PLASMA: Lactic Acid, Venous: 3 mmol/L (ref 0.5–1.9)

## 2024-09-06 LAB — CBC
HCT: 39.5 % (ref 36.0–46.0)
Hemoglobin: 13.4 g/dL (ref 12.0–15.0)
MCH: 27.2 pg (ref 26.0–34.0)
MCHC: 33.9 g/dL (ref 30.0–36.0)
MCV: 80.3 fL (ref 80.0–100.0)
Platelets: 379 K/uL (ref 150–400)
RBC: 4.92 MIL/uL (ref 3.87–5.11)
RDW: 14.7 % (ref 11.5–15.5)
WBC: 9.1 K/uL (ref 4.0–10.5)
nRBC: 0 % (ref 0.0–0.2)

## 2024-09-06 LAB — MRSA NEXT GEN BY PCR, NASAL: MRSA by PCR Next Gen: NOT DETECTED

## 2024-09-06 LAB — CREATININE, SERUM
Creatinine, Ser: 1.15 mg/dL — ABNORMAL HIGH (ref 0.44–1.00)
GFR, Estimated: 53 mL/min — ABNORMAL LOW (ref >60)

## 2024-09-06 LAB — POCT ACTIVATED CLOTTING TIME: Activated Clotting Time: 314 s

## 2024-09-06 LAB — PROTIME-INR
INR: 1 (ref 0.8–1.2)
Prothrombin Time: 13.3 s (ref 11.4–15.2)

## 2024-09-06 LAB — TROPONIN I (HIGH SENSITIVITY)
Troponin I (High Sensitivity): 36 ng/L — ABNORMAL HIGH (ref <18)
Troponin I (High Sensitivity): >24000 ng/L (ref <18)

## 2024-09-06 LAB — APTT: aPTT: 28 s (ref 24–36)

## 2024-09-06 SURGERY — CORONARY/GRAFT ACUTE MI REVASCULARIZATION
Anesthesia: Moderate Sedation

## 2024-09-06 MED ORDER — SODIUM CHLORIDE 0.9 % BOLUS PEDS
INTRAVENOUS | Status: AC | PRN
  Administered 2024-09-06: 250 mL via INTRAVENOUS

## 2024-09-06 MED ORDER — METHYLPREDNISOLONE SODIUM SUCC 125 MG IJ SOLR
INTRAMUSCULAR | Status: AC
  Filled 2024-09-06: qty 2

## 2024-09-06 MED ORDER — DIPHENHYDRAMINE HCL 50 MG/ML IJ SOLN: 25.0000 mg | Freq: Once | INTRAMUSCULAR | Status: AC

## 2024-09-06 MED ORDER — SODIUM CHLORIDE 0.9% FLUSH
3.0000 mL | Freq: Two times a day (BID) | INTRAVENOUS | Status: DC
  Administered 2024-09-06 – 2024-09-09 (×6): 3 mL via INTRAVENOUS

## 2024-09-06 MED ORDER — DORZOLAMIDE HCL-TIMOLOL MAL 2-0.5 % OP SOLN: 1.0000 [drp] | Freq: Two times a day (BID) | OPHTHALMIC | Status: DC

## 2024-09-06 MED ORDER — HEPARIN SODIUM (PORCINE) 1000 UNIT/ML IJ SOLN
INTRAMUSCULAR | Status: AC
  Filled 2024-09-06: qty 10

## 2024-09-06 MED ORDER — NITROGLYCERIN 1 MG/10 ML FOR IR/CATH LAB
INTRA_ARTERIAL | Status: AC
  Filled 2024-09-06: qty 10

## 2024-09-06 MED ORDER — HEPARIN SODIUM (PORCINE) 1000 UNIT/ML IJ SOLN
INTRAMUSCULAR | Status: DC | PRN
  Administered 2024-09-06: 5000 [IU] via INTRAVENOUS

## 2024-09-06 MED ORDER — METHYLPREDNISOLONE SODIUM SUCC 125 MG IJ SOLR
125.0000 mg | INTRAMUSCULAR | Status: AC
  Administered 2024-09-06: 125 mg via INTRAVENOUS

## 2024-09-06 MED ORDER — FENTANYL CITRATE (PF) 100 MCG/2ML IJ SOLN
INTRAMUSCULAR | Status: DC | PRN
  Administered 2024-09-06: 25 ug via INTRAVENOUS

## 2024-09-06 MED ORDER — MIDAZOLAM HCL 2 MG/2ML IJ SOLN
INTRAMUSCULAR | Status: AC
Start: 2024-09-06 — End: 2024-09-06
  Filled 2024-09-06: qty 2

## 2024-09-06 MED ORDER — PRASUGREL HCL 10 MG PO TABS
ORAL_TABLET | ORAL | Status: DC | PRN
  Administered 2024-09-06: 60 mg via ORAL

## 2024-09-06 MED ORDER — ATORVASTATIN CALCIUM 80 MG PO TABS
80.0000 mg | ORAL_TABLET | Freq: Every day | ORAL | Status: DC
  Administered 2024-09-06 – 2024-09-08 (×3): 80 mg via ORAL
  Filled 2024-09-06 (×3): qty 1

## 2024-09-06 MED ORDER — HEPARIN SODIUM (PORCINE) 5000 UNIT/ML IJ SOLN
4000.0000 [IU] | Freq: Once | INTRAMUSCULAR | Status: AC
  Administered 2024-09-06: 4000 [IU] via INTRAVENOUS

## 2024-09-06 MED ORDER — VERAPAMIL HCL 2.5 MG/ML IV SOLN
INTRAVENOUS | Status: AC
  Filled 2024-09-06: qty 2

## 2024-09-06 MED ORDER — OFLOXACIN 0.3 % OP SOLN: 1.0000 [drp] | Freq: Four times a day (QID) | OPHTHALMIC | Status: DC

## 2024-09-06 MED ORDER — HEPARIN (PORCINE) IN NACL 1000-0.9 UT/500ML-% IV SOLN
INTRAVENOUS | Status: DC | PRN
  Administered 2024-09-06 (×2): 500 mL

## 2024-09-06 MED ORDER — ACETAMINOPHEN 325 MG PO TABS: 650.0000 mg | ORAL_TABLET | ORAL | Status: DC | PRN

## 2024-09-06 MED ORDER — LORAZEPAM 1 MG PO TABS: 0.5000 mg | ORAL_TABLET | ORAL | Status: DC | PRN

## 2024-09-06 MED ORDER — SODIUM CHLORIDE 0.9 % IV SOLN: 250.0000 mL | INTRAVENOUS | Status: AC | PRN

## 2024-09-06 MED ORDER — TOPIRAMATE 25 MG PO TABS
25.0000 mg | ORAL_TABLET | Freq: Every day | ORAL | Status: DC
Start: 2024-09-06 — End: 2024-09-09
  Administered 2024-09-06 – 2024-09-08 (×3): 25 mg via ORAL
  Filled 2024-09-06 (×3): qty 1

## 2024-09-06 MED ORDER — PRASUGREL HCL 10 MG PO TABS
ORAL_TABLET | ORAL | Status: AC
  Filled 2024-09-06: qty 6

## 2024-09-06 MED ORDER — PRASUGREL HCL 10 MG PO TABS
10.0000 mg | ORAL_TABLET | Freq: Every day | ORAL | Status: DC
  Administered 2024-09-07 – 2024-09-09 (×3): 10 mg via ORAL
  Filled 2024-09-06 (×3): qty 1

## 2024-09-06 MED ORDER — SODIUM CHLORIDE 0.9 % IV SOLN
INTRAVENOUS | Status: AC
  Administered 2024-09-06: 10 mL/h via INTRAVENOUS

## 2024-09-06 MED ORDER — VERAPAMIL HCL 2.5 MG/ML IV SOLN
INTRAVENOUS | Status: DC | PRN
  Administered 2024-09-06: 2.5 mg via INTRAVENOUS

## 2024-09-06 MED ORDER — PANTOPRAZOLE SODIUM 40 MG PO TBEC
40.0000 mg | DELAYED_RELEASE_TABLET | Freq: Every day | ORAL | Status: DC
  Administered 2024-09-07 – 2024-09-09 (×3): 40 mg via ORAL
  Filled 2024-09-06 (×3): qty 1

## 2024-09-06 MED ORDER — DAPAGLIFLOZIN PROPANEDIOL 10 MG PO TABS
10.0000 mg | ORAL_TABLET | Freq: Every day | ORAL | Status: DC
Start: 2024-09-06 — End: 2024-09-07

## 2024-09-06 MED ORDER — FENTANYL CITRATE (PF) 100 MCG/2ML IJ SOLN
INTRAMUSCULAR | Status: AC
  Filled 2024-09-06: qty 2

## 2024-09-06 MED ORDER — HYDRALAZINE HCL 20 MG/ML IJ SOLN
10.0000 mg | Freq: Four times a day (QID) | INTRAMUSCULAR | Status: DC | PRN
  Administered 2024-09-06 – 2024-09-07 (×3): 10 mg via INTRAVENOUS
  Filled 2024-09-06 (×3): qty 1

## 2024-09-06 MED ORDER — ASPIRIN 81 MG PO CHEW
324.0000 mg | CHEWABLE_TABLET | Freq: Once | ORAL | Status: AC
  Administered 2024-09-06: 324 mg via ORAL

## 2024-09-06 MED ORDER — FREE WATER
500.0000 mL | Freq: Once | Status: AC
  Administered 2024-09-06: 500 mL via ORAL

## 2024-09-06 MED ORDER — ENOXAPARIN SODIUM 40 MG/0.4ML IJ SOSY
40.0000 mg | PREFILLED_SYRINGE | INTRAMUSCULAR | Status: DC
  Administered 2024-09-07 – 2024-09-09 (×3): 40 mg via SUBCUTANEOUS
  Filled 2024-09-06 (×3): qty 0.4

## 2024-09-06 MED ORDER — INSULIN ASPART 100 UNIT/ML IJ SOLN
0.0000 [IU] | Freq: Every day | INTRAMUSCULAR | Status: DC
  Administered 2024-09-06: 3 [IU] via SUBCUTANEOUS
  Filled 2024-09-06: qty 1

## 2024-09-06 MED ORDER — MIDAZOLAM HCL (PF) 2 MG/2ML IJ SOLN
INTRAMUSCULAR | Status: DC | PRN
  Administered 2024-09-06: 1 mg via INTRAVENOUS

## 2024-09-06 MED ORDER — HEPARIN SODIUM (PORCINE) 5000 UNIT/ML IJ SOLN: 60.0000 [IU]/kg | Freq: Once | INTRAMUSCULAR | Status: DC

## 2024-09-06 MED ORDER — ONDANSETRON HCL 4 MG/2ML IJ SOLN: 4.0000 mg | Freq: Four times a day (QID) | INTRAMUSCULAR | Status: DC | PRN

## 2024-09-06 MED ORDER — IOHEXOL 300 MG/ML  SOLN
INTRAMUSCULAR | Status: DC | PRN
  Administered 2024-09-06: 136 mL

## 2024-09-06 MED ORDER — DOCUSATE SODIUM 100 MG PO CAPS
100.0000 mg | ORAL_CAPSULE | Freq: Every day | ORAL | Status: DC
Start: 2024-09-06 — End: 2024-09-09
  Administered 2024-09-08 – 2024-09-09 (×2): 100 mg via ORAL
  Filled 2024-09-06 (×3): qty 1

## 2024-09-06 MED ORDER — INSULIN ASPART 100 UNIT/ML IJ SOLN
0.0000 [IU] | Freq: Three times a day (TID) | INTRAMUSCULAR | Status: DC
  Administered 2024-09-06: 3 [IU] via SUBCUTANEOUS
  Administered 2024-09-07 – 2024-09-08 (×4): 2 [IU] via SUBCUTANEOUS
  Administered 2024-09-08 – 2024-09-09 (×3): 1 [IU] via SUBCUTANEOUS
  Filled 2024-09-06 (×7): qty 1

## 2024-09-06 MED ORDER — DIPHENHYDRAMINE HCL 50 MG/ML IJ SOLN
INTRAMUSCULAR | Status: AC
  Administered 2024-09-06: 25 mg via INTRAVENOUS
  Filled 2024-09-06: qty 1

## 2024-09-06 MED ORDER — CHLORHEXIDINE GLUCONATE CLOTH 2 % EX PADS
6.0000 | MEDICATED_PAD | Freq: Every day | CUTANEOUS | Status: DC
  Administered 2024-09-06 – 2024-09-08 (×3): 6 via TOPICAL

## 2024-09-06 MED ORDER — ASPIRIN 81 MG PO TBEC
81.0000 mg | DELAYED_RELEASE_TABLET | Freq: Every day | ORAL | Status: DC
  Administered 2024-09-07 – 2024-09-09 (×3): 81 mg via ORAL
  Filled 2024-09-06 (×3): qty 1

## 2024-09-06 MED ORDER — HEPARIN (PORCINE) IN NACL 1000-0.9 UT/500ML-% IV SOLN
INTRAVENOUS | Status: AC
  Filled 2024-09-06: qty 1000

## 2024-09-06 MED ORDER — LIDOCAINE HCL (PF) 1 % IJ SOLN
INTRAMUSCULAR | Status: DC | PRN
  Administered 2024-09-06: 2 mL

## 2024-09-06 MED ORDER — SODIUM CHLORIDE 0.9% FLUSH: 3.0000 mL | INTRAVENOUS | Status: DC | PRN

## 2024-09-06 MED ORDER — NITROGLYCERIN 0.4 MG SL SUBL
0.4000 mg | SUBLINGUAL_TABLET | SUBLINGUAL | Status: DC | PRN
  Administered 2024-09-06: 0.4 mg via SUBLINGUAL

## 2024-09-06 MED ORDER — LIDOCAINE HCL 1 % IJ SOLN
INTRAMUSCULAR | Status: AC
  Filled 2024-09-06: qty 20

## 2024-09-06 SURGICAL SUPPLY — 16 items
BALLOON TREK RX 2.5X12 (BALLOONS) IMPLANT
CATH INFINITI 5 FR JL3.5 (CATHETERS) IMPLANT
CATH INFINITI AMBI 5FR JK (CATHETERS) IMPLANT
CATH INFINITI JR4 5F (CATHETERS) IMPLANT
CATH LAUNCHER 6FR EBU3.5 (CATHETERS) IMPLANT
DEVICE RAD TR BAND REGULAR (VASCULAR PRODUCTS) IMPLANT
DRAPE BRACHIAL (DRAPES) IMPLANT
GLIDESHEATH SLEND SS 6F .021 (SHEATH) IMPLANT
GUIDEWIRE INQWIRE 1.5J.035X260 (WIRE) IMPLANT
KIT ENCORE 26 ADVANTAGE (KITS) IMPLANT
PACK CARDIAC CATH (CUSTOM PROCEDURE TRAY) ×1 IMPLANT
SET ATX-X65L (MISCELLANEOUS) IMPLANT
STATION PROTECTION PRESSURIZED (MISCELLANEOUS) IMPLANT
STENT ONYX FRONTIER 2.5X12 (Permanent Stent) IMPLANT
TUBING CIL FLEX 10 FLL-RA (TUBING) IMPLANT
WIRE RUNTHROUGH .014X180CM (WIRE) IMPLANT

## 2024-09-06 NOTE — ED Notes (Signed)
 Code Stemi Called at 5:47pm

## 2024-09-06 NOTE — ED Triage Notes (Signed)
 Pt states she is having chest pain that started suddenly while she was at home. States it feels like someone punched her in the center of her chest and the pain goes to her left. States it hurts to breath.

## 2024-09-06 NOTE — ED Notes (Signed)
 STEMI

## 2024-09-06 NOTE — ED Notes (Signed)
 Cardiologist at beside

## 2024-09-06 NOTE — ED Triage Notes (Signed)
 First Nurse Note: Patient to ED via [facility] from home for centralized CP. Started after eating a fish sandwich.  98% RA 48 HR 180 cbg  Given 324 ASA, 1 spray nitroglycerin 18 R AC

## 2024-09-06 NOTE — ED Provider Notes (Signed)
 [facility] Provider Note    None    (approximate)   History   Chest Pain   HPI  Laura  A Camacho is a 63 y.o. 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  Pain is constant and crampy and she states that is nothing like she has ever experienced before.  She last ate 2 hours ago.  There was associated shortness of breath and nausea.  Denies any previous history of heart problems.      Physical Exam   Triage Vital Signs: ED Triage Vitals [09/06/24 1738]  Encounter Vitals Group     BP (!) 180/70     Girls Systolic BP Percentile      Girls Diastolic BP Percentile      Boys Systolic BP Percentile      Boys Diastolic BP Percentile      Pulse Rate (!) 57     Resp 18     Temp 97.9 F (36.6 C)     Temp Source Oral     SpO2 99 %     Weight      Height      Head Circumference      Peak Flow      Pain Score (S) 7     Pain Loc      Pain Education      Exclude from Growth Chart     Most recent vital signs: Vitals:   09/06/24 1800 09/06/24 1812  BP: (!) 152/67 (!) 227/64  Pulse: 62 62  Resp:    Temp:    SpO2: 100% 100%    Nursing Triage Note reviewed. Vital signs reviewed and patients oxygen saturation is normoxic Very hypertensive on arrival  General: Patient is well nourished, well developed, awake and alert, appears in pain holding fists to heart Head: Normocephalic and atraumatic Eyes: Normal inspection, extraocular muscles intact, no conjunctival pallor Ear, nose, throat: Normal external exam Neck: Normal range of motion Respiratory: Patient is in mild respiratory distress, lungs with slight rales Cardiovascular: Patient is not tachycardic, RR Patient has 2+ radial pulses bilaterally, has a scar in right axilla GI: Abd SNT with no guarding or rebound  Back: Normal  inspection of the back with good strength and range of motion throughout all ext Extremities: pulses intact with good cap refills, no LE pitting edema or calf tenderness Neuro: The patient is alert and oriented to person, place, and time, appropriately conversive, with 5/5 bilat UE/LE strength, no gross motor or sensory defects noted. Coordination appears to be adequate. Skin: Warm, dry, and intact Psych: normal mood and affect, no SI or HI  ED Results / Procedures / Treatments   Labs (all labs ordered are listed, but only abnormal results are displayed) Labs Reviewed  CBC WITH DIFFERENTIAL/PLATELET - Abnormal; Notable for the following components:      Result Value   WBC 10.6 (*)    Platelets 411 (*)    Lymphs Abs 5.8 (*)    All other components within normal limits  COMPREHENSIVE METABOLIC PANEL WITH GFR - Abnormal; Notable for the following components:   Potassium 2.8 (*)    Glucose, Bld 200 (*)    BUN 7 (*)    Creatinine, Ser 1.20 (*)    Calcium  8.7 (*)    Albumin 3.3 (*)    GFR,  Estimated 51 (*)    All other components within normal limits  LIPID PANEL - Abnormal; Notable for the following components:   HDL 34 (*)    All other components within normal limits  TROPONIN I (HIGH SENSITIVITY) - Abnormal; Notable for the following components:   Troponin I (High Sensitivity) 36 (*)    All other components within normal limits  PROTIME-INR  APTT  HEMOGLOBIN A1C  LACTIC ACID, PLASMA     EKG EKG and rhythm strip are interpreted by myself:   EKG: [Normal sinus rhythm] at heart rate of 50, normal QRS duration, QTc 390, elevated ST segments and T waves in inferior leads no ectopy EKG consistent with Acute STEMI  Rhythm strip: elevated ST segments in lead II EKG and rhythm strip are interpreted by myself:   EKG: [Normal sinus rhythm] at heart rate of 54, normal QRS duration, QTc 383, elevated ST segments and T waves in inferior leads no ectopy EKG consistent with Acute STEMI   Rhythm strip: elevated ST segments in lead II   RADIOLOGY Xray chest: No acute abnormality on my independent review interpretation radiologist reads this is low lung volumes    PROCEDURES:  Critical Care performed: Yes, see critical care procedure note(s)  .Critical Care  Performed by: Nicholaus Rolland BRAVO, MD Authorized by: Nicholaus Rolland BRAVO, MD   Critical care provider statement:    Critical care time (minutes):  35   Critical care was necessary to treat or prevent imminent or life-threatening deterioration of the following conditions:  Circulatory failure   Critical care was time spent personally by me on the following activities:  Development of treatment plan with patient or surrogate, discussions with consultants, evaluation of patient's response to treatment, examination of patient, ordering and review of laboratory studies, ordering and review of radiographic studies, ordering and performing treatments and interventions, pulse oximetry, re-evaluation of patient's condition and review of old charts   Care discussed with: admitting provider   Comments:     Patient presents as a STEMI and aspirin  and heparin and POCUS was determined on my watch including consultation with the cardiologist.  Patient was in the emergency department for more than 40 minutes before taken to the Cath Lab    MEDICATIONS ORDERED IN ED: Medications  0.9 %  sodium chloride  infusion (10 mL/hr Intravenous New Bag/Given 09/06/24 1755)  nitroGLYCERIN (NITROSTAT) SL tablet 0.4 mg ( Sublingual MAR Hold 09/06/24 1829)  fentaNYL  (SUBLIMAZE ) injection (25 mcg Intravenous Given 09/06/24 1829)  lidocaine  (PF) (XYLOCAINE ) 1 % injection (2 mLs Other Given 09/06/24 1830)  verapamil (ISOPTIN) injection (2.5 mg Intravenous Given 09/06/24 1830)  heparin sodium (porcine) injection (5,000 Units Intravenous Given 09/06/24 1837)  Heparin (Porcine) in NaCl 1000-0.9 UT/500ML-% SOLN (500 mLs  Given 09/06/24 1838)  midazolam  PF  (VERSED ) injection (1 mg Intravenous Given 09/06/24 1830)  prasugrel (EFFIENT) tablet (60 mg Oral Given 09/06/24 1854)  aspirin  chewable tablet 324 mg (324 mg Oral Given 09/06/24 1752)  heparin injection 4,000 Units (4,000 Units Intravenous Given 09/06/24 1750)  methylPREDNISolone  sodium succinate  (SOLU-MEDROL ) 125 mg/2 mL injection 125 mg (125 mg Intravenous Not Given 09/06/24 1820)  diphenhydrAMINE  (BENADRYL ) injection 25 mg (25 mg Intravenous Given 09/06/24 1815)     IMPRESSION / MDM / ASSESSMENT AND PLAN / ED COURSE                                Differential diagnosis includes, but  is not limited to, acute ACS, hypertensive urgency, GERD, less likely PE or dissection  ED course: Patient was initially triaged and I made forced contact up to seeing the EKG which I thought was consistent with likely STEMI.  I went out and examined the patient and she did endorse chest pain that was ongoing and appeared unwell and consequently a STEMI alert was called.  Repeat EKG remained consistent.  Patient was given 324 chewable aspirin  and IV was inserted and pads were placed.  Patient was given a bolus of heparin.  I did consider aortic dissection given how elevated the heart rate was however patient is neurovascularly intact and bedside ultrasound demonstrated no pericardial effusion with good heart squeeze making dissection unlikely.  Although this was an inferior STEMI patient's blood pressure was significantly elevated and so I did give a dose of sublingual nitro with some improvement patient's symptoms began to improve.  Dr. Darron at bedside reviewed the indications benefits risks and alternatives of PCI with the patient who voiced understanding and agreement.  Given her contrast allergy she will be premedicated before hand.  He will likely need to go through the groin given her inability to have interventions in the right upper extremity.  Patient signed out to cardiologist with many labs still  pending   Clinical Course as of 09/06/24 1856  Fri Sep 06, 2024  1804 I did a bedside point-of-care ultrasound and I do not see any pericardial effusion.  Dr. Darron did call back and is reported to be 5 minutes away [HD]  1812 Dr. Darron at bedside.  [HD]    Clinical Course User Index [HD] Nicholaus Rolland BRAVO, MD   -- Risk: 5 This patient has a high risk of morbidity due to further diagnostic testing or treatment. Rationale: This patient's evaluation and management involve a high risk of morbidity due to the potential severity of presenting symptoms, need for diagnostic testing, and/or initiation of treatment that may require close monitoring. The differential includes conditions with potential for significant deterioration or requiring escalation of care. Treatment decisions in the ED, including medication administration, procedural interventions, or disposition planning, reflect this level of risk. COPA: 5 The patient has the following acute or chronic illness/injury that poses a possible threat to life or bodily function: [X] : The patient has a potentially serious acute condition or an acute exacerbation of a chronic illness requiring urgent evaluation and management in the Emergency Department. The clinical presentation necessitates immediate consideration of life-threatening or function-threatening diagnoses, even if they are ultimately ruled out.   FINAL CLINICAL IMPRESSION(S) / ED DIAGNOSES   Final diagnoses:  ST elevation myocardial infarction (STEMI), unspecified artery (HCC)  Chest pain of uncertain etiology  Secondary hypertension  Contrast media allergy     Rx / DC Orders   ED Discharge Orders     None        Note:  This document was prepared using Dragon voice recognition software and may include unintentional dictation errors.   Nicholaus Rolland BRAVO, MD 09/06/24 [phone]

## 2024-09-06 NOTE — Progress Notes (Signed)
 eLink Physician-Brief Progress Note Patient Name: Laura Camacho  MARTIKA EGLER DOB: 1961-09-10 MRN: [id number]   Date of Service  09/06/2024  HPI/Events of Note  Patient admitted with STEMI and s/p cardiac catheterization with PCI + DES.  eICU Interventions  New Patient Evaluation.        Rolly Magri U Jamesyn Lindell 09/06/2024, 8:08 PM

## 2024-09-06 NOTE — Consult Note (Signed)
 Cardiology Consultation   Patient ID: Laura Camacho MRN: [id number]; DOB: 1961/02/24  Admit date: 09/06/2024 Date of Consult: 09/06/2024  PCP:  Carin Gauze, NP   [facility] Providers Cardiologist:  None     new ETTER Cage)   Patient Profile: Laura Camacho is a 63 y.o. female with a hx of diabetes mellitus, carotid disease, essential hypertension and hyperlipidemia who is being seen 09/06/2024 for the evaluation of inferior STEMI at the request of Dr. Nicholaus.  History of Present Illness: Laura Camacho is a 63 year old female with no prior cardiac history.  She presented with acute onset of substernal chest pain and tightness that started 1 hour before presentation.  The pain was severe.  No previous similar episodes.  This started 2 hours after she had dinner.  Associated symptoms include shortness of breath and nausea.  No vomiting.  She was hypertensive on presentation with systolic blood pressure around 200.  Her EKG showed inferior ST elevation with minor ST depression in V2.  Based on this, code STEMI was activated.   Past Medical History:  Diagnosis Date   Complication of anesthesia    STATES WAS TOLD NOT TO TAKE UNKNOWN ANESTHESIA AFTER NECKFUSION OR HYSTERECTOMY   Diabetes mellitus without complication (HCC)    Dyslipidemia 05/05/2017   Epilepsy (HCC)    Fibromyalgia    GERD (gastroesophageal reflux disease)    Glaucoma (increased eye pressure)    History of Glaucoma but no drops   Headache    Hyperlipidemia    Hypertension    Neurofibromatosis (HCC)    Pyelonephritis     Past Surgical History:  Procedure Laterality Date   ABDOMINAL HYSTERECTOMY     has both ovaries- [facility]   Bilateral Arm Surgery Bilateral    'tumors' removed   BREAST BIOPSY Left 11/05/2018   us  core venus clip  BIOPSY: GRANULAR CELL TUMOR, 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   BREAST BIOPSY Left 11/05/2018   us  core axilla hydro marker  NEGATIVE FOR METASTATIC CARCINOMA    CERVICAL FUSION     COLON SURGERY     COLONOSCOPY N/A 07/27/2020   Procedure: COLONOSCOPY;  Surgeon: Maryruth Ole DASEN, MD;  Location: [facility] ENDOSCOPY;  Service: Endoscopy;  Laterality: N/A;   COLONOSCOPY WITH PROPOFOL  N/A 02/12/2016   Procedure: COLONOSCOPY WITH PROPOFOL ;  Surgeon: Donnice Vaughn Manes, MD;  Location: [facility] ENDOSCOPY;  Service: Endoscopy;  Laterality: N/A;   COLONOSCOPY WITH PROPOFOL  N/A 06/30/2020   Procedure: COLONOSCOPY WITH PROPOFOL ;  Surgeon: Maryruth Ole DASEN, MD;  Location: [facility] ENDOSCOPY;  Service: Endoscopy;  Laterality: N/A;   ESOPHAGOGASTRODUODENOSCOPY (EGD) WITH PROPOFOL  N/A 02/12/2016   Procedure: ESOPHAGOGASTRODUODENOSCOPY (EGD) WITH PROPOFOL ;  Surgeon: Donnice Vaughn Manes, MD;  Location: [facility] ENDOSCOPY;  Service: Endoscopy;  Laterality: N/A;   ESOPHAGOGASTRODUODENOSCOPY (EGD) WITH PROPOFOL  N/A 06/30/2020   Procedure: ESOPHAGOGASTRODUODENOSCOPY (EGD) WITH PROPOFOL ;  Surgeon: Maryruth Ole DASEN, MD;  Location: [facility] ENDOSCOPY;  Service: Endoscopy;  Laterality: N/A;   VULVECTOMY N/A 06/26/2017   Procedure: WIDE EXCISION VULVECTOMY;  Surgeon: Kathe Gladis DELENA, MD;  Location: [facility] ORS;  Service: Gynecology;  Laterality: N/A;     Home Medications:  Prior to Admission medications   Medication Sig Start Date End Date Taking? Authorizing Provider  acetaminophen  (TYLENOL ) 650 MG CR tablet Take 650 mg by mouth every 8 (eight) hours as needed for pain.    [provider]  amLODipine  (NORVASC ) 10 MG tablet Take 1 tablet (10 mg total) by mouth daily. 05/23/24   Scoggins, [facility],  NP  aspirin  EC 81 MG tablet Take 1 tablet (81 mg total) by mouth daily. 07/21/23   Scoggins, Amber, NP  atorvastatin  (LIPITOR) 80 MG tablet Take 1 tablet (80 mg total) by mouth daily. 07/21/23   Scoggins, Amber, NP  Blood Glucose Calibration (TRUE METRIX LEVEL 1) Low SOLN  04/22/21   [provider]  Budeson-Glycopyrrol-Formoterol (BREZTRI  AEROSPHERE) 160-9-4.8 MCG/ACT AERO Inhale 2 puffs  into the lungs in the morning and at bedtime. 07/24/23   Scoggins, Amber, NP  chlorthalidone  (HYGROTON ) 25 MG tablet Take 1 tablet (25 mg total) by mouth daily. 07/21/23   Scoggins, Amber, NP  citalopram  (CELEXA ) 10 MG tablet Take 1 tablet (10 mg total) by mouth daily. 07/21/23   Scoggins, Amber, NP  clopidogrel  (PLAVIX ) 75 MG tablet Take 1 tablet (75 mg total) by mouth daily. 07/21/23   Scoggins, Amber, NP  dapagliflozin  propanediol (FARXIGA ) 10 MG TABS tablet Take 1 tablet (10 mg total) by mouth daily. 07/21/23   Scoggins, Amber, NP  dexlansoprazole  (DEXILANT ) 60 MG capsule Take 1 capsule (60 mg total) by mouth daily. 07/21/23   Scoggins, Amber, NP  diltiazem  (DILACOR XR ) 180 MG 24 hr capsule Take 2 capsules (360 mg total) by mouth at bedtime. 07/21/23   Scoggins, Amber, NP  docusate sodium  (STOOL SOFTENER) 100 MG capsule Take 1 capsule (100 mg total) by mouth daily. 07/21/23   Scoggins, Amber, NP  dorzolamide-timolol (COSOPT) 22.3-6.8 MG/ML ophthalmic solution Place 1 drop into both eyes 2 (two) times daily.    [provider]  DULoxetine  (CYMBALTA ) 60 MG capsule Take 1 capsule (60 mg total) by mouth daily. 07/21/23   Scoggins, Amber, NP  ergocalciferol  (VITAMIN D2) 1.25 MG (50000 UT) capsule Take 1 capsule (50,000 Units total) by mouth once a week. 07/21/23   Scoggins, Amber, NP  famotidine  (PEPCID ) 40 MG tablet Take 1 tablet (40 mg total) by mouth daily. 07/21/23   Scoggins, [facility], NP  fluticasone  (FLONASE ) 50 MCG/ACT nasal spray PLACE 1 SPRAY IN EACH NOSTRIL ONCE DAILYAS NEEDED FOR ALLERGIES 09/26/23   Scoggins, [facility], NP  gabapentin  (NEURONTIN ) 300 MG capsule Take 1-2 capsules (300-600 mg total) by mouth at bedtime as needed (pain-pt takes 1-2 capsules at bedtime PRN). 07/21/23   Scoggins, Amber, NP  glucose blood (PRODIGY NO CODING BLOOD GLUC) test strip Use as instructed 03/27/23   Fernand Fredy RAMAN, MD  LANTUS  SOLOSTAR 100 UNIT/ML Solostar Pen INJECT 35 UNITS SUBCUTANEOUSLY AT BEDTIME 09/04/23    Scoggins, [facility], NP  losartan  (COZAAR ) 100 MG tablet Take 1 tablet (100 mg total) by mouth daily. 07/21/23   Scoggins, Amber, NP  Nebivolol  HCl 20 MG TABS Take 1 tablet (20 mg total) by mouth daily. 07/21/23   Scoggins, Amber, NP  ofloxacin (OCUFLOX) 0.3 % ophthalmic solution Place 1 drop into both eyes 4 (four) times daily.    [provider]  omeprazole  (PRILOSEC) 40 MG capsule Take 1 capsule (40 mg total) by mouth at bedtime. 07/21/23   Scoggins, Amber, NP  phenazopyridine  (PYRIDIUM ) 100 MG tablet Take 1 tablet (100 mg total) by mouth 3 (three) times daily as needed for pain. 11/19/18   Sainani, Vivek J, MD  Suvorexant (BELSOMRA) 10 MG TABS Take 10 mg by mouth at bedtime as needed.    [provider]  topiramate  (TOPAMAX ) 25 MG tablet Take 1 tablet (25 mg total) by mouth at bedtime. 04/25/24 04/25/25  Scoggins, Amber, NP  traZODone  (DESYREL ) 50 MG tablet Take 1 tablet (50 mg total) by mouth  at bedtime. 07/21/23   Scoggins, Amber, NP    Scheduled Meds:  insulin  aspart  0-5 Units Subcutaneous QHS   insulin  aspart  0-9 Units Subcutaneous TID WC   [START ON 09/07/2024] prasugrel  10 mg Oral Daily   Continuous Infusions:  sodium chloride  10 mL/hr (09/06/24 1755)   PRN Meds: nitroGLYCERIN  Allergies:    Allergies  Allergen Reactions   Codeine Anaphylaxis    Throat swells up   Bupropion     Unknown   Cefuroxime Rash   Ciprofloxacin Rash   Fluconazole Itching   Ibuprofen  Itching   Iodine Hives   Ivp Dye [Iodinated Contrast Media] Hives   Metformin And Related Itching   Nizoral [Ketoconazole] Other (See Comments)    Dizzy/ Fainting   Sulfa Antibiotics Hives   Voltaren [Diclofenac Sodium] Diarrhea and Nausea And Vomiting    Social History:   Social History   Socioeconomic History   Marital status: Single    Spouse name: Not on file   Number of children: Not on file   Years of education: Not on file   Highest education level: Not on file  Occupational History    Not on file  Tobacco Use   Smoking status: Every Day    Current packs/day: 0.50    Types: Cigarettes   Smokeless tobacco: Never   Tobacco comments:    0.5 ppd 06/09/2021  Vaping Use   Vaping status: Never Used  Substance and Sexual Activity   Alcohol use: Yes    Alcohol/week: 3.0 - 4.0 standard drinks of alcohol    Types: 3 - 4 Shots of liquor per week    Comment: once a week   Drug use: Not Currently    Types: Marijuana    Comment: monthly   Sexual activity: Yes    Birth control/protection: Surgical  Other Topics Concern   Not on file  Social History Narrative   Not on file   Social Drivers of Health   Financial Resource Strain: Not on file  Food Insecurity: Not on file  Transportation Needs: Not on file  Physical Activity: Not on file  Stress: Not on file  Social Connections: Not on file  Intimate Partner Violence: Not on file    Family History:    Family History  Problem Relation Age of Onset   Diabetes Mother    Diabetes Father    Diabetes Maternal Aunt    Diabetes Maternal Uncle    Breast cancer Cousin    Ovarian cancer Neg Hx    Colon cancer Neg Hx      ROS:  Please see the history of present illness.   All other ROS reviewed and negative.     Physical Exam/Data: Vitals:   09/06/24 1909 09/06/24 1914 09/06/24 1919 09/06/24 1924  BP: 139/65     Pulse: (!) 0 (!) 0 (!) 0 (!) 0  Resp: (!) 24     Temp:      TempSrc:      SpO2:       No intake or output data in the 24 hours ending 09/06/24 1935    06/12/2024    1:43 PM 05/23/2024    1:25 PM 05/13/2024   10:16 PM  Last 3 Weights  Weight (lbs) 182 lb 9.6 oz 186 lb 9.6 oz 190 lb  Weight (kg) 82.827 kg 84.641 kg 86.183 kg     There is no height or weight on file to calculate BMI.  General:  Well  nourished, well developed, in no acute distress HEENT: normal Neck: no JVD Vascular: No carotid bruits; Distal pulses 2+ bilaterally Cardiac:  normal S1, S2; RRR; no murmur  Lungs:  clear to auscultation  bilaterally, no wheezing, rhonchi or rales  Abd: soft, nontender, no hepatomegaly  Ext: no edema Musculoskeletal:  No deformities, BUE and BLE strength normal and equal Skin: warm and dry  Neuro:  CNs 2-12 intact, no focal abnormalities noted Psych:  Normal affect   EKG:  The EKG was personally reviewed and demonstrates: Sinus rhythm with 1 to 2 mm of inferior ST elevation with ST depression in V2. Telemetry:  Telemetry was personally reviewed and demonstrates:    Relevant CV Studies:   Laboratory Data: High Sensitivity Troponin:   Recent Labs  Lab 09/06/24 1746  TROPONINIHS 36*     Chemistry Recent Labs  Lab 09/06/24 1746  NA 139  K 2.8*  CL 102  CO2 27  GLUCOSE 200*  BUN 7*  CREATININE 1.20*  CALCIUM  8.7*  GFRNONAA 51*  ANIONGAP 10    Recent Labs  Lab 09/06/24 1746  PROT 7.1  ALBUMIN 3.3*  AST 19  ALT 12  ALKPHOS 71  BILITOT 0.5   Lipids  Recent Labs  Lab 09/06/24 1746  CHOL 133  TRIG 129  HDL 34*  LDLCALC 73  CHOLHDL 3.9    Hematology Recent Labs  Lab 09/06/24 1746  WBC 10.6*  RBC 4.87  HGB 13.2  HCT 39.9  MCV 81.9  MCH 27.1  MCHC 33.1  RDW 14.8  PLT 411*   Thyroid  No results for input(s): TSH, FREET4 in the last 168 hours.  BNPNo results for input(s): BNP, PROBNP in the last 168 hours.  DDimer No results for input(s): DDIMER in the last 168 hours.  Radiology/Studies:  CARDIAC CATHETERIZATION Result Date: 09/06/2024   Mid Cx to Dist Cx lesion is 100% stenosed.   Prox LAD to Mid LAD lesion is 60% stenosed.   1st Diag lesion is 60% stenosed.   Mid LAD lesion is 20% stenosed.   Prox Cx to Mid Cx lesion is 30% stenosed.   Prox RCA to Mid RCA lesion is 40% stenosed.   Dist RCA lesion is 40% stenosed.   A drug-eluting stent was successfully placed using a STENT ONYX FRONTIER 2.5X12.   Post intervention, there is a 0% residual stenosis.   As long as the patient continues to meet low risk STEMI criteria and in the absence of any other  complications or medical issues, we expect the patient to be ready for discharge on 09/08/2024.   Recommend uninterrupted dual antiplatelet therapy with Aspirin  81mg  daily and Prasugrel 10mg  daily for a minimum of 12 months (ACS-Class I recommendation). 1.  Inferior STEMI due to occluded distal left circumflex.  There is also moderate diffuse LAD and RCA disease. 2.  Left ventricular angiography was not performed.  LVEDP was moderately elevated at 22 mmHg. 3.  Successful angioplasty and drug-eluting stent placement to the distal left circumflex. Recommendations: Blood pressure meds were held due to postperfusion bradycardia and hypotension.  Resume as needed for blood pressure. I requested an echocardiogram to evaluate LV systolic function.  The patient had frequent PVCs and idioventricular rhythm post reperfusion.  Will start a beta-blocker once she is no longer bradycardic.   DG Chest Port 1 View Result Date: 09/06/2024 CLINICAL DATA:  Chest pain. EXAM: PORTABLE CHEST 1 VIEW COMPARISON:  March 06, 2016 FINDINGS: The heart size and mediastinal contours are  within normal limits. Low lung volumes are noted. Very mild atelectatic changes are seen within the lateral aspect of the right lung base. No focal consolidation, pleural effusion or pneumothorax is identified. Postoperative changes are present within the lower cervical spine. The visualized skeletal structures are unremarkable. IMPRESSION: Low lung volumes with very mild right basilar atelectasis. Electronically Signed   By: Suzen Dials M.D.   On: 09/06/2024 18:05     Assessment and Plan: Inferior STEMI: Emergent cardiac catheterization was done via the right radial artery which showed occluded distal left circumflex which was the culprit.  This was treated successfully with PCI and 1 drug-eluting stent placement.  Recommend aspirin  indefinitely and prasugrel 10 mg daily for 1 year.  She does have moderate diffuse disease affecting the LAD and RCA  that will be treated medically for now.   Possible heart failure due to myocardial infarction: I requested an echocardiogram.  LVEDP was moderately elevated.  Will only diurese if needed.  Will start carvedilol once she is no longer bradycardic. Essential hypertension: She is on multiple antihypertensive medications which were held.  Due to a quick drop in blood pressure post revascularization.  Resume as needed but avoid diltiazem . Hyperlipidemia: Continue high-dose atorvastatin . Diabetes mellitus: Management per internal medicine. PVCs and idioventricular rhythm: This is postperfusion and should improve with time.  Will start a beta-blocker once she is no longer bradycardic.   Risk Assessment/Risk Scores:    TIMI Risk Score for ST  Elevation MI:   The patient's TIMI risk score is 1, which indicates a 1.6% risk of all cause mortality at 30 days.{        For questions or updates, please contact  [facility] Please consult [email or web address] for contact info under      Signed, Deatrice Cage, MD  09/06/2024 7:35 PM

## 2024-09-06 NOTE — H&P (Addendum)
 [place]   PATIENT NAME: Laura Camacho    MR#:  [id number]  DATE OF BIRTH:  09-03-1961  DATE OF ADMISSION:  09/06/2024  PRIMARY CARE PHYSICIAN: Carin Gauze, NP   Patient is coming from: Home  REQUESTING/REFERRING PHYSICIAN: Darron Grass, MD  CHIEF COMPLAINT:   Chief Complaint  Patient presents with   Chest Pain    HISTORY OF PRESENT ILLNESS:  Laura Camacho is a 63 y.o. African-American female with medical history significant for type II reasonableness, dyslipidemia, fibromyalgia, GERD, hypertension and dyslipidemia as well as ongoing tobacco abuse, who presented to the emergency room pain felt with associated, nausea and vomiting as well as diaphoresis without radiation.  She denied any cough or wheezing . No dysuria, oliguria or hematuria or flank pain.  No fever or chills.  No paresthesias or focal muscle weakness.  Her pain started 2 hours prior to presentation after she ate dinner.  She had associated dyspnea with it.  Initial EKG showed ST segment elevation in inferior leads with minor ST segment depression in V2 and therefore code STEMI was called. Will ED Course: When she came to the ER BP was 157/65 with heart rate of 54 and otherwise normal vital signs.  Labs revealed hypokalemia of 2.8 and creatinine 1.2 with albumin 3.3 and CBC showed WBCs of 10.6.  Coag profile was normal.  Hemoglobin A1c was 7.5.  High sensitive troponin I was 1 24,000.  Lactic acid was 3 and later 2.3.  Hypokalemia EKG as reviewed by me : EKG showed above findings with a rate of 50. Imaging: Portable chest x-ray showed low lung volumes with very mild right basal atelectasis.  The patient was given 125 mg of IV Solu-Medrol , 25 mg of IV Benadryl  and 324 mg of aspirin  with IV heparin.  Dr. Darron was contacted by the patient and the patient was taken to the Cath Lab and underwent PCI with LCA stent placement.  She was admitted to an ICU bed for further evaluation and  management. PAST MEDICAL HISTORY:   Past Medical History:  Diagnosis Date   Complication of anesthesia    STATES WAS TOLD NOT TO TAKE UNKNOWN ANESTHESIA AFTER NECKFUSION OR HYSTERECTOMY   Diabetes mellitus without complication (HCC)    Dyslipidemia 05/05/2017   Epilepsy (HCC)    Fibromyalgia    GERD (gastroesophageal reflux disease)    Glaucoma (increased eye pressure)    History of Glaucoma but no drops   Headache    Hyperlipidemia    Hypertension    Neurofibromatosis (HCC)    Pyelonephritis     PAST SURGICAL HISTORY:   Past Surgical History:  Procedure Laterality Date   ABDOMINAL HYSTERECTOMY     has both ovaries- [place]   Bilateral Arm Surgery Bilateral    'tumors' removed   BREAST BIOPSY Left 11/05/2018   us  core venus clip  BIOPSY: GRANULAR CELL TUMOR, 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   BREAST BIOPSY Left 11/05/2018   us  core axilla hydro marker  NEGATIVE FOR METASTATIC CARCINOMA   CERVICAL FUSION     COLON SURGERY     COLONOSCOPY N/A 07/27/2020   Procedure: COLONOSCOPY;  Surgeon: Maryruth Ole DASEN, MD;  Location: [facility] ENDOSCOPY;  Service: Endoscopy;  Laterality: N/A;   COLONOSCOPY WITH PROPOFOL  N/A 02/12/2016   Procedure: COLONOSCOPY WITH PROPOFOL ;  Surgeon: Donnice Vaughn Manes, MD;  Location: [facility] ENDOSCOPY;  Service: Endoscopy;  Laterality: N/A;   COLONOSCOPY WITH PROPOFOL  N/A 06/30/2020   Procedure: COLONOSCOPY  WITH PROPOFOL ;  Surgeon: Maryruth Ole DASEN, MD;  Location: [facility] ENDOSCOPY;  Service: Endoscopy;  Laterality: N/A;   ESOPHAGOGASTRODUODENOSCOPY (EGD) WITH PROPOFOL  N/A 02/12/2016   Procedure: ESOPHAGOGASTRODUODENOSCOPY (EGD) WITH PROPOFOL ;  Surgeon: Donnice Vaughn Manes, MD;  Location: [facility] ENDOSCOPY;  Service: Endoscopy;  Laterality: N/A;   ESOPHAGOGASTRODUODENOSCOPY (EGD) WITH PROPOFOL  N/A 06/30/2020   Procedure: ESOPHAGOGASTRODUODENOSCOPY (EGD) WITH PROPOFOL ;  Surgeon: Maryruth Ole DASEN, MD;  Location: [facility] ENDOSCOPY;  Service: Endoscopy;  Laterality: N/A;    VULVECTOMY N/A 06/26/2017   Procedure: WIDE EXCISION VULVECTOMY;  Surgeon: Kathe Gladis LABOR, MD;  Location: [facility] ORS;  Service: Gynecology;  Laterality: N/A;    SOCIAL HISTORY:   Social History   Tobacco Use   Smoking status: Every Day    Current packs/day: 0.50    Types: Cigarettes   Smokeless tobacco: Never   Tobacco comments:    0.5 ppd 06/09/2021  Substance Use Topics   Alcohol use: Yes    Alcohol/week: 3.0 - 4.0 standard drinks of alcohol    Types: 3 - 4 Shots of liquor per week    Comment: once a week    FAMILY HISTORY:   Family History  Problem Relation Age of Onset   Diabetes Mother    Diabetes Father    Diabetes Maternal Aunt    Diabetes Maternal Uncle    Breast cancer Cousin    Ovarian cancer Neg Hx    Colon cancer Neg Hx     DRUG ALLERGIES:   Allergies  Allergen Reactions   Codeine Anaphylaxis    Throat swells up   Bupropion     Unknown   Cefuroxime Rash   Ciprofloxacin Rash   Fluconazole Itching   Ibuprofen  Itching   Iodine Hives   Ivp Dye [Iodinated Contrast Media] Hives   Metformin And Related Itching   Nizoral [Ketoconazole] Other (See Comments)    Dizzy/ Fainting   Sulfa Antibiotics Hives   Voltaren [Diclofenac Sodium] Diarrhea and Nausea And Vomiting    REVIEW OF SYSTEMS:   ROS As per history of present illness. All pertinent systems were reviewed above. Constitutional, HEENT, cardiovascular, respiratory, GI, GU, musculoskeletal, neuro, psychiatric, endocrine, integumentary and hematologic systems were reviewed and are otherwise negative/unremarkable except for positive findings mentioned above in the HPI.   MEDICATIONS AT HOME:   Prior to Admission medications   Medication Sig Start Date End Date Taking? Authorizing Provider  acetaminophen  (TYLENOL ) 650 MG CR tablet Take 650 mg by mouth every 8 (eight) hours as needed for pain.    [provider]  amLODipine  (NORVASC ) 10 MG tablet Take 1 tablet (10 mg total) by mouth  daily. 05/23/24   Scoggins, Amber, NP  aspirin  EC 81 MG tablet Take 1 tablet (81 mg total) by mouth daily. 07/21/23   Scoggins, Amber, NP  atorvastatin  (LIPITOR) 80 MG tablet Take 1 tablet (80 mg total) by mouth daily. 07/21/23   Scoggins, Amber, NP  Blood Glucose Calibration (TRUE METRIX LEVEL 1) Low SOLN  04/22/21   [provider]  Budeson-Glycopyrrol-Formoterol (BREZTRI  AEROSPHERE) 160-9-4.8 MCG/ACT AERO Inhale 2 puffs into the lungs in the morning and at bedtime. 07/24/23   Scoggins, Amber, NP  chlorthalidone  (HYGROTON ) 25 MG tablet Take 1 tablet (25 mg total) by mouth daily. 07/21/23   Scoggins, Amber, NP  citalopram  (CELEXA ) 10 MG tablet Take 1 tablet (10 mg total) by mouth daily. 07/21/23   Scoggins, Amber, NP  clopidogrel  (PLAVIX ) 75 MG tablet Take 1 tablet (75 mg total) by mouth daily.  07/21/23   Scoggins, Amber, NP  dapagliflozin  propanediol (FARXIGA ) 10 MG TABS tablet Take 1 tablet (10 mg total) by mouth daily. 07/21/23   Scoggins, Amber, NP  dexlansoprazole  (DEXILANT ) 60 MG capsule Take 1 capsule (60 mg total) by mouth daily. 07/21/23   Scoggins, Amber, NP  diltiazem  (DILACOR XR ) 180 MG 24 hr capsule Take 2 capsules (360 mg total) by mouth at bedtime. 07/21/23   Scoggins, Amber, NP  docusate sodium  (STOOL SOFTENER) 100 MG capsule Take 1 capsule (100 mg total) by mouth daily. 07/21/23   Scoggins, Amber, NP  dorzolamide-timolol (COSOPT) 22.3-6.8 MG/ML ophthalmic solution Place 1 drop into both eyes 2 (two) times daily.    [provider]  DULoxetine  (CYMBALTA ) 60 MG capsule Take 1 capsule (60 mg total) by mouth daily. 07/21/23   Scoggins, Amber, NP  ergocalciferol  (VITAMIN D2) 1.25 MG (50000 UT) capsule Take 1 capsule (50,000 Units total) by mouth once a week. 07/21/23   Scoggins, Amber, NP  famotidine  (PEPCID ) 40 MG tablet Take 1 tablet (40 mg total) by mouth daily. 07/21/23   Scoggins, Amber, NP  fluticasone  (FLONASE ) 50 MCG/ACT nasal spray PLACE 1 SPRAY IN EACH NOSTRIL ONCE DAILYAS  NEEDED FOR ALLERGIES 09/26/23   Scoggins, Hospital Doctor, NP  gabapentin  (NEURONTIN ) 300 MG capsule Take 1-2 capsules (300-600 mg total) by mouth at bedtime as needed (pain-pt takes 1-2 capsules at bedtime PRN). 07/21/23   Scoggins, Amber, NP  glucose blood (PRODIGY NO CODING BLOOD GLUC) test strip Use as instructed 03/27/23   Fernand Fredy RAMAN, MD  LANTUS  SOLOSTAR 100 UNIT/ML Solostar Pen INJECT 35 UNITS SUBCUTANEOUSLY AT BEDTIME 09/04/23   Scoggins, Amber, NP  losartan  (COZAAR ) 100 MG tablet Take 1 tablet (100 mg total) by mouth daily. 07/21/23   Scoggins, Amber, NP  Nebivolol  HCl 20 MG TABS Take 1 tablet (20 mg total) by mouth daily. 07/21/23   Scoggins, Amber, NP  ofloxacin (OCUFLOX) 0.3 % ophthalmic solution Place 1 drop into both eyes 4 (four) times daily.    [provider]  omeprazole  (PRILOSEC) 40 MG capsule Take 1 capsule (40 mg total) by mouth at bedtime. 07/21/23   Scoggins, Amber, NP  phenazopyridine  (PYRIDIUM ) 100 MG tablet Take 1 tablet (100 mg total) by mouth 3 (three) times daily as needed for pain. 11/19/18   Sainani, Vivek J, MD  Suvorexant (BELSOMRA) 10 MG TABS Take 10 mg by mouth at bedtime as needed.    [provider]  topiramate  (TOPAMAX ) 25 MG tablet Take 1 tablet (25 mg total) by mouth at bedtime. 04/25/24 04/25/25  Scoggins, Amber, NP  traZODone  (DESYREL ) 50 MG tablet Take 1 tablet (50 mg total) by mouth at bedtime. 07/21/23   Scoggins, Hospital Doctor, NP      VITAL SIGNS:  Blood pressure (!) 146/74, pulse (!) 57, temperature 98.2 F (36.8 C), temperature source Oral, resp. rate 15, height 5' 1 (1.549 m), weight 87.4 kg, SpO2 100%.  PHYSICAL EXAMINATION:  Physical Exam  GENERAL:  63 y.o.-year-old African-American female patient lying in the bed with no acute distress.  EYES: Pupils equal, round, reactive to light and accommodation. No scleral icterus. Extraocular muscles intact.  HEENT: Head atraumatic, normocephalic. Oropharynx and nasopharynx clear.  NECK:  Supple, no  jugular venous distention. No thyroid  enlargement, no tenderness.  LUNGS: Normal breath sounds bilaterally, no wheezing, rales,rhonchi or crepitation. No use of accessory muscles of respiration.  CARDIOVASCULAR: Regular rate and rhythm, S1, S2 normal. No murmurs, rubs, or gallops.  ABDOMEN: Soft, nondistended, nontender. Bowel  sounds present. No organomegaly or mass.  EXTREMITIES: No pedal edema, cyanosis, or clubbing.  NEUROLOGIC: Cranial nerves II through XII are intact. Muscle strength 5/5 in all extremities. Sensation intact. Gait not checked.  PSYCHIATRIC: The patient is alert and oriented x 3.  Normal affect and good eye contact. SKIN: No obvious rash, lesion, or ulcer.   LABORATORY PANEL:   CBC Recent Labs  Lab 09/06/24 2045  WBC 9.1  HGB 13.4  HCT 39.5  PLT 379   ------------------------------------------------------------------------------------------------------------------  Chemistries  Recent Labs  Lab 09/06/24 1746 09/06/24 2045  NA 139  --   K 2.8*  --   CL 102  --   CO2 27  --   GLUCOSE 200*  --   BUN 7*  --   CREATININE 1.20* 1.15*  CALCIUM  8.7*  --   AST 19  --   ALT 12  --   ALKPHOS 71  --   BILITOT 0.5  --    ------------------------------------------------------------------------------------------------------------------  Cardiac Enzymes No results for input(s): TROPONINI in the last 168 hours. ------------------------------------------------------------------------------------------------------------------  RADIOLOGY:  CARDIAC CATHETERIZATION Result Date: 09/06/2024   Mid Cx to Dist Cx lesion is 100% stenosed.   Prox LAD to Mid LAD lesion is 60% stenosed.   1st Diag lesion is 60% stenosed.   Mid LAD lesion is 20% stenosed.   Prox Cx to Mid Cx lesion is 30% stenosed.   Prox RCA to Mid RCA lesion is 40% stenosed.   Dist RCA lesion is 40% stenosed.   A drug-eluting stent was successfully placed using a STENT ONYX FRONTIER 2.5X12.   Post  intervention, there is a 0% residual stenosis.   As long as the patient continues to meet low risk STEMI criteria and in the absence of any other complications or medical issues, we expect the patient to be ready for discharge on 09/08/2024.   Recommend uninterrupted dual antiplatelet therapy with Aspirin  81mg  daily and Prasugrel 10mg  daily for a minimum of 12 months (ACS-Class I recommendation). 1.  Inferior STEMI due to occluded distal left circumflex.  There is also moderate diffuse LAD and RCA disease. 2.  Left ventricular angiography was not performed.  LVEDP was moderately elevated at 22 mmHg. 3.  Successful angioplasty and drug-eluting stent placement to the distal left circumflex. Recommendations: Blood pressure meds were held due to postperfusion bradycardia and hypotension.  Resume as needed for blood pressure. I requested an echocardiogram to evaluate LV systolic function.  The patient had frequent PVCs and idioventricular rhythm post reperfusion.  Will start a beta-blocker once she is no longer bradycardic.   DG Chest Port 1 View Result Date: 09/06/2024 CLINICAL DATA:  Chest pain. EXAM: PORTABLE CHEST 1 VIEW COMPARISON:  March 06, 2016 FINDINGS: The heart size and mediastinal contours are within normal limits. Low lung volumes are noted. Very mild atelectatic changes are seen within the lateral aspect of the right lung base. No focal consolidation, pleural effusion or pneumothorax is identified. Postoperative changes are present within the lower cervical spine. The visualized skeletal structures are unremarkable. IMPRESSION: Low lung volumes with very mild right basilar atelectasis. Electronically Signed   By: Suzen Dials M.D.   On: 09/06/2024 18:05      IMPRESSION AND PLAN:  Assessment and Plan: * Acute ST elevation myocardial infarction (STEMI) of inferior wall (HCC) - The patient underwent PCI and LCA stent placement. - She is admitted to the ICU. - She will be placed on aspirin  as  well as as needed  sublingual nitroglycerin and morphine  sulfate for pain.  Plavix  will be continued - High-dose statin therapy will be provided. - Given her borderline BP during her procedure her antihypertensives were held off.  Dyslipidemia - She will be continued on high-dose statin therapy as mentioned above.  Type 2 diabetes mellitus with peripheral neuropathy (HCC) - The patient will be placed on supplemental coverage with NovoLog . - Will continue her basal coverage. - Will continue Farxiga  and Neurontin .  Hypokalemia - Potassium will be replaced and magnesium  level will be checked.  Essential hypertension - Will be placed on as needed IV hydralazine and labetalol pending improvement of her blood pressure then her antihypertensives can be resumed from this morning.  Depression - Will continue Celexa .   DVT prophylaxis: Lovenox . Advanced Care Planning:  Code Status: full code. Family Communication:  The plan of care was discussed in details with the patient (and family). I answered all questions. The patient agreed to proceed with the above mentioned plan. Further management will depend upon hospital course. Disposition Plan: Back to previous home environment Consults called: Cardiology. All the records are reviewed and case discussed with ED provider.  Status is: Inpatient  At the time of the admission, it appears that the appropriate admission status for this patient is inpatient.  This is judged to be reasonable and necessary in order to provide the required intensity of service to ensure the patient's safety given the presenting symptoms, physical exam findings and initial radiographic and laboratory data in the context of comorbid conditions.  The patient requires inpatient status due to high intensity of service, high risk of further deterioration and high frequency of surveillance required.  I certify that at the time of admission, it is my clinical judgment that the  patient will require inpatient hospital care extending more than 2 midnights.                            Dispo: The patient is from: Home              Anticipated d/c is to: Home              Patient currently is not medically stable to d/c.              Difficult to place patient: No  Madison DELENA Peaches M.D on 09/07/2024 at 4:32 AM  [facility]   From 7 PM-7 AM, contact night-coverage [email or web address]  CC: Primary care physician; Carin Gauze, NP

## 2024-09-07 ENCOUNTER — Inpatient Hospital Stay (HOSPITAL_COMMUNITY)
Admit: 2024-09-07 | Discharge: 2024-09-07 | Disposition: A | Discharge status: Home / Self Care | Attending: Cardiovascular Disease | Admitting: Cardiovascular Disease

## 2024-09-07 DIAGNOSIS — I493 Ventricular premature depolarization: Secondary | ICD-10-CM | POA: Diagnosis not present

## 2024-09-07 DIAGNOSIS — Z794 Long term (current) use of insulin: Secondary | ICD-10-CM

## 2024-09-07 DIAGNOSIS — I2119 ST elevation (STEMI) myocardial infarction involving other coronary artery of inferior wall: Secondary | ICD-10-CM

## 2024-09-07 DIAGNOSIS — I517 Cardiomegaly: Secondary | ICD-10-CM

## 2024-09-07 DIAGNOSIS — E872 Acidosis, unspecified: Secondary | ICD-10-CM

## 2024-09-07 DIAGNOSIS — I152 Hypertension secondary to endocrine disorders: Secondary | ICD-10-CM | POA: Insufficient documentation

## 2024-09-07 DIAGNOSIS — E785 Hyperlipidemia, unspecified: Secondary | ICD-10-CM

## 2024-09-07 DIAGNOSIS — E1142 Type 2 diabetes mellitus with diabetic polyneuropathy: Secondary | ICD-10-CM

## 2024-09-07 DIAGNOSIS — I10 Essential (primary) hypertension: Secondary | ICD-10-CM | POA: Insufficient documentation

## 2024-09-07 DIAGNOSIS — F32A Depression, unspecified: Secondary | ICD-10-CM | POA: Insufficient documentation

## 2024-09-07 DIAGNOSIS — E876 Hypokalemia: Secondary | ICD-10-CM | POA: Insufficient documentation

## 2024-09-07 DIAGNOSIS — E118 Type 2 diabetes mellitus with unspecified complications: Secondary | ICD-10-CM

## 2024-09-07 LAB — CBC
HCT: 41.6 % (ref 36.0–46.0)
Hemoglobin: 14.4 g/dL (ref 12.0–15.0)
MCH: 27.5 pg (ref 26.0–34.0)
MCHC: 34.6 g/dL (ref 30.0–36.0)
MCV: 79.4 fL — ABNORMAL LOW (ref 80.0–100.0)
Platelets: 398 K/uL (ref 150–400)
RBC: 5.24 MIL/uL — ABNORMAL HIGH (ref 3.87–5.11)
RDW: 14.9 % (ref 11.5–15.5)
WBC: 11 K/uL — ABNORMAL HIGH (ref 4.0–10.5)
nRBC: 0 % (ref 0.0–0.2)

## 2024-09-07 LAB — BASIC METABOLIC PANEL WITH GFR
Anion gap: 9 (ref 5–15)
BUN: 10 mg/dL (ref 8–23)
CO2: 23 mmol/L (ref 22–32)
Calcium: 8.8 mg/dL — ABNORMAL LOW (ref 8.9–10.3)
Chloride: 105 mmol/L (ref 98–111)
Creatinine, Ser: 1.1 mg/dL — ABNORMAL HIGH (ref 0.44–1.00)
GFR, Estimated: 56 mL/min — ABNORMAL LOW (ref >60)
Glucose, Bld: 241 mg/dL — ABNORMAL HIGH (ref 70–99)
Potassium: 3.6 mmol/L (ref 3.5–5.1)
Sodium: 137 mmol/L (ref 135–145)

## 2024-09-07 LAB — ECHOCARDIOGRAM COMPLETE
AR max vel: 2.26 cm2
AV Peak grad: 7 mmHg
Ao pk vel: 1.32 m/s
Area-P 1/2: 2.74 cm2
Height: 61 in
S' Lateral: 3 cm
Weight: 3082.91 [oz_av]

## 2024-09-07 LAB — MAGNESIUM: Magnesium: 1.9 mg/dL (ref 1.7–2.4)

## 2024-09-07 LAB — LACTIC ACID, PLASMA
Lactic Acid, Venous: 2.3 mmol/L (ref 0.5–1.9)
Lactic Acid, Venous: 2.5 mmol/L (ref 0.5–1.9)

## 2024-09-07 LAB — GLUCOSE, CAPILLARY
Glucose-Capillary: 194 mg/dL — ABNORMAL HIGH (ref 70–99)
Glucose-Capillary: 186 mg/dL — ABNORMAL HIGH (ref 70–99)
Glucose-Capillary: 151 mg/dL — ABNORMAL HIGH (ref 70–99)
Glucose-Capillary: 183 mg/dL — ABNORMAL HIGH (ref 70–99)

## 2024-09-07 MED ORDER — POTASSIUM CHLORIDE 20 MEQ PO PACK
40.0000 meq | PACK | Freq: Once | ORAL | Status: AC
  Administered 2024-09-07: 40 meq via ORAL
  Filled 2024-09-07: qty 2

## 2024-09-07 MED ORDER — DIAZEPAM 5 MG/ML IJ SOLN
2.5000 mg | Freq: Four times a day (QID) | INTRAMUSCULAR | Status: DC | PRN
  Administered 2024-09-07 (×2): 2.5 mg via INTRAVENOUS
  Administered 2024-09-08: 5 mg via INTRAVENOUS
  Filled 2024-09-07 (×3): qty 2

## 2024-09-07 MED ORDER — CALCIUM CARBONATE ANTACID 500 MG PO CHEW
2.0000 | CHEWABLE_TABLET | Freq: Three times a day (TID) | ORAL | Status: DC | PRN
  Administered 2024-09-07 (×2): 400 mg via ORAL
  Filled 2024-09-07 (×2): qty 2

## 2024-09-07 MED ORDER — LOSARTAN POTASSIUM 50 MG PO TABS
25.0000 mg | ORAL_TABLET | Freq: Every day | ORAL | Status: DC
  Administered 2024-09-07 – 2024-09-08 (×2): 25 mg via ORAL
  Filled 2024-09-07 (×2): qty 1

## 2024-09-07 MED ORDER — METOPROLOL TARTRATE 25 MG PO TABS
12.5000 mg | ORAL_TABLET | Freq: Two times a day (BID) | ORAL | Status: DC
  Administered 2024-09-07 – 2024-09-08 (×4): 12.5 mg via ORAL
  Filled 2024-09-07 (×4): qty 1

## 2024-09-07 NOTE — Plan of Care (Signed)
   Problem: Education: Goal: Knowledge of General Education information will improve Description Including pain rating scale, medication(s)/side effects and non-pharmacologic comfort measures Outcome: Progressing   Problem: Health Behavior/Discharge Planning: Goal: Ability to manage health-related needs will improve Outcome: Progressing

## 2024-09-07 NOTE — Assessment & Plan Note (Signed)
-   Potassium will be replaced and magnesium level will be checked. ?

## 2024-09-07 NOTE — Assessment & Plan Note (Signed)
-   She will be continued on high-dose statin therapy as mentioned above.

## 2024-09-07 NOTE — Progress Notes (Signed)
  Echocardiogram 2D Echocardiogram has been performed.  Thedora GORMAN Louder 09/07/2024, 8:37 AM

## 2024-09-07 NOTE — Assessment & Plan Note (Addendum)
-   The patient underwent PCI and LCA stent placement. - She is admitted to the ICU. - She will be placed on aspirin  as well as as needed sublingual nitroglycerin and morphine  sulfate for pain.  Plavix  will be continued - High-dose statin therapy will be provided. - Given her borderline BP during her procedure her antihypertensives were held off.

## 2024-09-07 NOTE — Assessment & Plan Note (Addendum)
-   The patient will be placed on supplemental coverage with NovoLog . - Will continue her basal coverage. - Will continue Farxiga  and Neurontin .

## 2024-09-07 NOTE — Hospital Course (Addendum)
 Hospital course / significant events:   Laura Camacho is a 63 y.o. African-American female with medical history significant for type II reasonableness, dyslipidemia, fibromyalgia, GERD, hypertension and dyslipidemia as well as ongoing tobacco abuse, who presented to the emergency room pain felt with associated, nausea and vomiting as well as diaphoresis without radiation.  She denied any cough or wheezing . No dysuria, oliguria or hematuria or flank pain.  No fever or chills.  No paresthesias or focal muscle weakness.  Her pain started 2 hours prior to presentation after she ate dinner.  She had associated dyspnea with it.  Initial EKG showed ST segment elevation in inferior leads with minor ST segment depression in V2 and therefore code STEMI was called.  ED Course: When she came to the ER BP was 157/65 with heart rate of 54 and otherwise normal vital signs.  Labs revealed hypokalemia of 2.8 and creatinine 1.2 with albumin 3.3 and CBC showed WBCs of 10.6.  Coag profile was normal.  Hemoglobin A1c was 7.5.  High sensitive troponin I was 1 24,000.  Lactic acid was 3 and later 2.3.  Hypokalemia EKG as reviewed by me : EKG showed above findings with a rate of 50. Imaging: Portable chest x-ray showed low lung volumes with very mild right basal atelectasis.   The patient was given 125 mg of IV Solu-Medrol , 25 mg of IV Benadryl  and 324 mg of aspirin  with IV heparin.  Dr. Darron was contacted by the patient and the patient was taken to the Cath Lab and underwent PCI with LCA stent placement.  She was admitted to an ICU bed for further evaluation and management.  Cardiology following - meds adjusting today     Consultants:  cardiology  Procedures/Surgeries: 10/13: Cardiac cath w DES      ASSESSMENT & PLAN:   Acute ST elevation myocardial infarction (STEMI) of inferior wall  S/p cath w/ stent placement  Cardiology following  aspirin  long term, prasugrel for 1 year. Was on aspirin  and  clopidogrel  prior to admission (per chart), though inconsistent adherence  Low dose beta blocker   Dyslipidemia-  Goal LDL <55 given DM2 atorvastatin  80 mg daily Cardiology will further discuss adding ezetimibe prior to discharge vs. [id number].   intermittent PVCs and some NSVT/IVR overnight low dose beta blocker   HFpEF - Echo w/ preserved LVEF and strain, with mild focal hypokinesis of basal inferolateral wall. Right side not well visualized, cannot see IVC to assess RAP, but E/e' suggests elevated LVEDP and there is grade 2 diastolic dysfunction, suggests component of diastolic heart failure  Cardiology following  No diuretic needed at this time    HTN Losartan  Beta blocker as above Monitor and titrate as needed Avoid diltiazem   Dc nebivolol  (home med)  Prefer spironolactone over home chlorthalidone , consider add this   Type 2 diabetes mellitus with peripheral neuropathy Farxiga  and Neurontin . SSI   Hypokalemia Replace as needed Monitor BMP   Depression Will continue Celexa .  Social stressors  See TOC note DSS report made    Class 2 obesity based on BMI: Body mass index is 36.41 kg/m.SABRA Significantly low or high BMI is associated with higher medical risk.  Underweight - under 18  overweight - 25 to 29 obese - 30 or more Class 1 obesity: BMI of 30.0 to 34 Class 2 obesity: BMI of 35.0 to 39 Class 3 obesity: BMI of 40.0 to 49 Super Morbid Obesity: BMI 50-59 Super-super Morbid Obesity: BMI 60+ Healthy nutrition and physical  activity advised as adjunct to other disease management and risk reduction treatments    DVT prophylaxis: lovenox  IV fluids: no continuous IV fluids  Nutrition: cardaic/diabetic Central lines / other devices: none  Code Status: FULL CODE ACP documentation reviewed:  none on file in [facility]  TOC needs: HH, SW Medical barriers to dispo: cardiology monitoring. Expected medical readiness for discharge tomorrow / pend cardiology clearance .

## 2024-09-07 NOTE — TOC Initial Note (Signed)
 Transition of Care [facility]) - Initial/Assessment Note    Patient Details  Name: Laura  [name] MRN: [id number] Date of Birth: 07-20-1961  [facility]) CM/SW Contact:    Samra Pesch L Jevante Hollibaugh, LCSW Phone Number: 09/07/2024, 10:01 AM  Clinical Narrative:                  [facility] consult received for SDOH and transportation needs. Resources were uploaded onto the AVS. RN was provided information for patient to call Medicaid Transportation.   Secure chat sent to attending regarding HHA. CSW advised that a SW can be provided if there were additional needs re: AIDE, PT/OT, RN.        Patient Goals and CMS Choice            Expected Discharge Plan and Services                                              Prior Living Arrangements/Services                       Activities of Daily Living      Permission Sought/Granted                  Emotional Assessment              Admission diagnosis:  Acute ST elevation myocardial infarction (STEMI) of inferior wall (HCC) [I21.19] Acute myocardial infarction of inferior wall (HCC) [I21.19] Patient Active Problem List   Diagnosis Date Noted   Dyslipidemia 09/07/2024   Depression 09/07/2024   Type 2 diabetes mellitus with peripheral neuropathy (HCC) 09/07/2024   Essential hypertension 09/07/2024   Hypokalemia 09/07/2024   Acute ST elevation myocardial infarction (STEMI) of inferior wall (HCC) 09/06/2024   Acute myocardial infarction of inferior wall (HCC) 09/06/2024   Type II or unspecified type diabetes mellitus without mention of complication, not stated as uncontrolled 07/15/2022   Carotid stenosis 07/15/2022   Gait difficulty 07/15/2021   Low back pain 07/15/2021   Neurofibromatosis (HCC) 05/31/2021   Whole body pain 05/31/2021   Other hydronephrosis    Acute pyelonephritis 11/16/2018   Vulvar mass 06/26/2017   Monilial vulvovaginitis 05/23/2017   Essential hypertension, benign  05/05/2017   Hyperlipidemia 05/05/2017   GERD (gastroesophageal reflux disease) 05/05/2017   Chronic pain syndrome 05/05/2017   Obesity 05/05/2017   Status post hysterectomy 05/05/2017   Menopause 05/05/2017   Increased BMI 05/05/2017   Insulin  dependent type 2 diabetes mellitus (HCC) 12/29/2006   Glaucoma 12/29/2006   PCP:  Carin Gauze, NP Pharmacy:   [facility] [address] ([address]), [place] - [address] [place] ([place]) [place] [address] Phone: [phone] Fax: [phone]  [id number] - [place], [place] - [address] [place] [address] Phone: [phone] Fax: [phone]  [facility] - [place], [place] - [address] [place] [address] Phone: [phone] Fax: [phone]  [facility] [address] [place] [address] Phone: [phone] Fax: [phone]     Social Drivers of Health (SDOH) Social History: SDOH Screenings   Food Insecurity: Food Insecurity Present (09/07/2024)  Housing: Unknown (09/07/2024)  Transportation Needs: Unmet Transportation Needs (09/07/2024)  Utilities: Not At Risk (09/07/2024)  Tobacco Use: High Risk (09/06/2024)  SDOH Interventions:     Readmission Risk Interventions     No data to display

## 2024-09-07 NOTE — Progress Notes (Signed)
 Pt to be transferred to 2A-[id number]. Report given to Ayoodeji RN. All questions answered. Vital signs stable at this time.

## 2024-09-07 NOTE — TOC Progression Note (Addendum)
 Transition of Care [facility]) - Progression Note    Patient Details  Name: Laura Camacho MRN: [id number] Date of Birth: [birth year]  [facility] CM/SW Contact  Latoy Labriola L Emmilee [place], [place] Phone Number: 09/07/2024, 11:59 AM  Clinical Narrative:     Brief assessment completed. Patient resides alone. She reported that her grand daughter is on the lease but she does not live there. SDOH reviewed. Patient stated that her grand daughter receives FNS but she uses the money to pay for her personal car. Patient advised that she tells her grand daughter to stop bringing the children to the home for her, patient, to babysit but doesn't listen.   CSW expressed concerns that patient is not medically nor physically stable to serve as a babysitter. CSW reiterated to patient that CSW is a mandated reporter. DSS may be able to assist the grand daughter with services.   HHA discussed. Patient agreeable to Home Health being set up for RN, SW and other services needed. She provided no preference for service provider.   Regarding food, patient advised that she has food in the home but she is feeding her great grand children. She stated that she can't get into the kitchen because there is a baby gate to prevent the two year old from getting in. She advised of an incident where one of the children tried to make her and himself something to eat and he did not know that he couldn't put a can in the microwave. Patient noted some serious safety concerns.   Patient advised that she keeps a bat by her side when she is babysitting because otherwise she wouldn't be able to control them.   Transportation discussed. Patient was provided with contact information to schedule Medicaid transportation. Patient advised that she has family that can pick her up once discharged but she also advised that they can be unreliable.   A report to APS and CPS is needed. CSW alerted DSS to have the on call SW to call. CSW awaiting  response.     1:30pm: APS and CPS report made. CSW received a call from [name], on call DSS SW with [facility] DSS.    2:35pm: Call received from Denasia, ACDSS. Request to delay discharge until Monday. Denasia advised that DSS is accepting the report with a 72 hour timeframe. She advised that they'd prefer to meet with patient at [facility] where her symptoms are being managed by medical professionals.                    Expected Discharge Plan and Services                                               Social Drivers of Health (SDOH) Interventions SDOH Screenings   Food Insecurity: Food Insecurity Present (09/07/2024)  Housing: Unknown (09/07/2024)  Transportation Needs: Unmet Transportation Needs (09/07/2024)  Utilities: Not At Risk (09/07/2024)  Tobacco Use: High Risk (09/06/2024)    Readmission Risk Interventions     No data to display

## 2024-09-07 NOTE — Plan of Care (Signed)

## 2024-09-07 NOTE — Progress Notes (Signed)
 PROGRESS NOTE    Laura Camacho   FMW:[id number] DOB: December 19, 1960  DOA: 09/06/2024 Date of Service: 09/07/24 which is hospital day 1  PCP: Carin Gauze, NP    Hospital course / significant events:   Laura Camacho is a 63 y.o. African-American female with medical history significant for type II reasonableness, dyslipidemia, fibromyalgia, GERD, hypertension and dyslipidemia as well as ongoing tobacco abuse, who presented to the emergency room pain felt with associated, nausea and vomiting as well as diaphoresis without radiation.  She denied any cough or wheezing . No dysuria, oliguria or hematuria or flank pain.  No fever or chills.  No paresthesias or focal muscle weakness.  Her pain started 2 hours prior to presentation after she ate dinner.  She had associated dyspnea with it.  Initial EKG showed ST segment elevation in inferior leads with minor ST segment depression in V2 and therefore code STEMI was called.  ED Course: When she came to the ER BP was 157/65 with heart rate of 54 and otherwise normal vital signs.  Labs revealed hypokalemia of 2.8 and creatinine 1.2 with albumin 3.3 and CBC showed WBCs of 10.6.  Coag profile was normal.  Hemoglobin A1c was 7.5.  High sensitive troponin I was 1 24,000.  Lactic acid was 3 and later 2.3.  Hypokalemia EKG as reviewed by me : EKG showed above findings with a rate of 50. Imaging: Portable chest x-ray showed low lung volumes with very mild right basal atelectasis.   The patient was given 125 mg of IV Solu-Medrol , 25 mg of IV Benadryl  and 324 mg of aspirin  with IV heparin.  Dr. Darron was contacted by the patient and the patient was taken to the Cath Lab and underwent PCI with LCA stent placement.  She was admitted to an ICU bed for further evaluation and management.  Cardiology following - meds adjusting today     Consultants:  cardiology  Procedures/Surgeries: 10/13: Cardiac cath w DES      ASSESSMENT & PLAN:   Acute ST  elevation myocardial infarction (STEMI) of inferior wall  S/p cath w/ stent placement  Cardiology following  aspirin  long term, prasugrel for 1 year. Was on aspirin  and clopidogrel  prior to admission (per chart), though inconsistent adherence  Low dose beta blocker   Dyslipidemia-  Goal LDL <55 given DM2 atorvastatin  80 mg daily Cardiology will further discuss adding ezetimibe prior to discharge vs. PCSK9i.   intermittent PVCs and some NSVT/IVR overnight low dose beta blocker   HFpEF - Echo w/ preserved LVEF and strain, with mild focal hypokinesis of basal inferolateral wall. Right side not well visualized, cannot see IVC to assess RAP, but E/e' suggests elevated LVEDP and there is grade 2 diastolic dysfunction, suggests component of diastolic heart failure  Cardiology following  No diuretic needed at this time    HTN Losartan  Beta blocker as above Monitor and titrate as needed Avoid diltiazem   Dc nebivolol  (home med)  Prefer spironolactone over home chlorthalidone , consider add this   Type 2 diabetes mellitus with peripheral neuropathy Farxiga  and Neurontin . SSI   Hypokalemia Replace as needed Monitor BMP   Depression Will continue Celexa .  Social stressors  See TOC note DSS report made    Class 2 obesity based on BMI: Body mass index is 36.41 kg/m.SABRA Significantly low or high BMI is associated with higher medical risk.  Underweight - under 18  overweight - 25 to 29 obese - 30 or more Class 1 obesity: BMI of  30.0 to 34 Class 2 obesity: BMI of 35.0 to 39 Class 3 obesity: BMI of 40.0 to 49 Super Morbid Obesity: BMI 50-59 Super-super Morbid Obesity: BMI 60+ Healthy nutrition and physical activity advised as adjunct to other disease management and risk reduction treatments    DVT prophylaxis: lovenox  IV fluids: no continuous IV fluids  Nutrition: cardaic/diabetic Central lines / other devices: none  Code Status: FULL CODE ACP documentation reviewed:  none on  file in [facility]  TOC needs: HH, SW Medical barriers to dispo: cardiology monitoring. Expected medical readiness for discharge tomorrow / pend cardiology clearance .              Subjective / Brief ROS:  Patient reports severe stress over her home situation w/ babysitting her grandkids, see TOC note Denies CP/SOB.  Pain controlled.  Denies new weakness.  Tolerating diet.  Reports no concerns w/ urination/defecation.   Family Communication: none at this time    Objective Findings:  Vitals:   09/07/24 1000 09/07/24 1100 09/07/24 1200 09/07/24 1216  BP: (!) 164/72 121/61 131/85 (!) 150/80  Pulse: (!) 53 69 70 74  Resp: 20 (!) 25 16 18   Temp:   98.1 F (36.7 C) 98.2 F (36.8 C)  TempSrc:   Oral Oral  SpO2: 100% 100% 100% 100%  Weight:      Height:        Intake/Output Summary (Last 24 hours) at 09/07/2024 1434 Last data filed at 09/07/2024 1231 Gross per 24 hour  Intake 650.83 ml  Output 1750 ml  Net -1099.17 ml   Filed Weights   09/06/24 1945  Weight: 87.4 kg    Examination:  Physical Exam Constitutional:      General: She is not in acute distress. Cardiovascular:     Rate and Rhythm: Regular rhythm. Tachycardia present.  Pulmonary:     Breath sounds: Normal breath sounds.  Musculoskeletal:     Right lower leg: No edema.     Left lower leg: No edema.  Neurological:     Mental Status: She is alert and oriented to person, place, and time.  Psychiatric:        Mood and Affect: Mood is anxious (on interview/exam patient was yelling out very anxious about her home situation, she waws able to calm herself).          Scheduled Medications:   aspirin  EC  81 mg Oral Daily   atorvastatin   80 mg Oral QHS   Chlorhexidine  Gluconate Cloth  6 each Topical Daily   docusate sodium   100 mg Oral Daily   enoxaparin  (LOVENOX ) injection  40 mg Subcutaneous Q24H   insulin  aspart  0-5 Units Subcutaneous QHS   insulin  aspart  0-9 Units Subcutaneous TID WC    losartan   25 mg Oral Daily   metoprolol tartrate  12.5 mg Oral BID   pantoprazole   40 mg Oral Daily   prasugrel  10 mg Oral Daily   sodium chloride  flush  3 mL Intravenous Q12H   topiramate   25 mg Oral QHS    Continuous Infusions:  sodium chloride  10 mL/hr at 09/07/24 0600   sodium chloride       PRN Medications:  sodium chloride , acetaminophen , calcium  carbonate, diazepam, hydrALAZINE, nitroGLYCERIN, ondansetron  (ZOFRAN ) IV, sodium chloride  flush  Antimicrobials from admission:  Anti-infectives (From admission, onward)    None           Data Reviewed:  I have personally reviewed the following...  CBC: Recent Labs  Lab 09/06/24 1746 09/06/24 2045 09/07/24 0440  WBC 10.6* 9.1 11.0*  NEUTROABS 3.9  --   --   HGB 13.2 13.4 14.4  HCT 39.9 39.5 41.6  MCV 81.9 80.3 79.4*  PLT 411* 379 398   Basic Metabolic Panel: Recent Labs  Lab 09/06/24 1746 09/06/24 2045 09/07/24 0440  NA 139  --  137  K 2.8*  --  3.6  CL 102  --  105  CO2 27  --  23  GLUCOSE 200*  --  241*  BUN 7*  --  10  CREATININE 1.20* 1.15* 1.10*  CALCIUM  8.7*  --  8.8*  MG  --   --  1.9   GFR: Estimated Creatinine Clearance: 51.9 mL/min (A) (by C-G formula based on SCr of 1.1 mg/dL (H)). Liver Function Tests: Recent Labs  Lab 09/06/24 1746  AST 19  ALT 12  ALKPHOS 71  BILITOT 0.5  PROT 7.1  ALBUMIN 3.3*   No results for input(s): LIPASE, AMYLASE in the last 168 hours. No results for input(s): AMMONIA in the last 168 hours. Coagulation Profile: Recent Labs  Lab 09/06/24 1746  INR 1.0   Cardiac Enzymes: No results for input(s): CKTOTAL, CKMB, CKMBINDEX, TROPONINI in the last 168 hours. BNP (last 3 results) No results for input(s): PROBNP in the last 8760 hours. HbA1C: Recent Labs    09/06/24 1746  HGBA1C 7.5*   CBG: Recent Labs  Lab 09/06/24 2027 09/06/24 2319 09/07/24 0724 09/07/24 1127  GLUCAP 243* 266* 194* 186*   Lipid Profile: Recent Labs     09/06/24 1746  CHOL 133  HDL 34*  LDLCALC 73  TRIG 870  CHOLHDL 3.9   Thyroid  Function Tests: No results for input(s): TSH, T4TOTAL, FREET4, T3FREE, THYROIDAB in the last 72 hours. Anemia Panel: No results for input(s): VITAMINB12, FOLATE, FERRITIN, TIBC, IRON, RETICCTPCT in the last 72 hours. Most Recent Urinalysis On File:     Component Value Date/Time   COLORURINE YELLOW (A) 05/14/2024 0315   APPEARANCEUR HAZY (A) 05/14/2024 0315   LABSPEC 1.004 (L) 05/14/2024 0315   PHURINE 5.0 05/14/2024 0315   GLUCOSEU 50 (A) 05/14/2024 0315   HGBUR SMALL (A) 05/14/2024 0315   BILIRUBINUR NEGATIVE 05/14/2024 0315   KETONESUR NEGATIVE 05/14/2024 0315   PROTEINUR >=300 (A) 05/14/2024 0315   NITRITE NEGATIVE 05/14/2024 0315   LEUKOCYTESUR NEGATIVE 05/14/2024 0315   Sepsis Labs: @LABRCNTIP (procalcitonin:4,lacticidven:4) Microbiology: Recent Results (from the past 240 hours)  MRSA Next Gen by PCR, Nasal     Status: None   Collection Time: 09/06/24  7:51 PM   Specimen: Nasal Mucosa; Nasal Swab  Result Value Ref Range Status   MRSA by PCR Next Gen NOT DETECTED NOT DETECTED Final    Comment: (NOTE) The GeneXpert MRSA Assay (FDA approved for NASAL specimens only), is one component of a comprehensive MRSA colonization surveillance program. It is not intended to diagnose MRSA infection nor to guide or monitor treatment for MRSA infections. Test performance is not FDA approved in patients less than 22 years old. Performed at [facility], [address], [place], [place] [address]       Radiology Studies last 3 days: ECHOCARDIOGRAM COMPLETE Result Date: 09/07/2024    ECHOCARDIOGRAM REPORT   Patient Name:   Laura Camacho Date of Exam: 09/07/2024 Medical Rec #:  [id number]           Height:       61.0 in Accession #:    [id number]  Weight:       192.7 lb Date of Birth:  05-Jul-1961           BSA:          1.859 m Patient Age:    63 years             BP:           157/68 mmHg Patient Gender: F                   HR:           54 bpm. Exam Location:  [facility] Procedure: 2D Echo, Cardiac Doppler, Color Doppler and Strain Analysis (Both            Spectral and Color Flow Doppler were utilized during procedure). Indications:     Acute myocardial infarction I21.9  History:         Patient has no prior history of Echocardiogram examinations.  Sonographer:     Thedora Louder RDCS, FASE Referring Phys:  [id number] FLYJFFJI A ARIDA Diagnosing Phys: Shelda Bruckner MD  Sonographer Comments: Global longitudinal strain was attempted. IMPRESSIONS  1. Left ventricular ejection fraction, by estimation, is 55 to 60%. The left ventricle has normal function. The left ventricle demonstrates regional wall motion abnormalities (see scoring diagram/findings for description). There is moderate concentric left ventricular hypertrophy. Left ventricular diastolic parameters are consistent with Grade II diastolic dysfunction (pseudonormalization). Elevated left ventricular end-diastolic pressure. There is hypokinesis of the left ventricular, basal inferolateral wall. The average left ventricular global longitudinal strain is -18.8 %. The global longitudinal strain is normal.  2. Right ventricular systolic function was not well visualized. The right ventricular size is not well visualized. Tricuspid regurgitation signal is inadequate for assessing PA pressure.  3. The mitral valve is grossly normal. Trivial mitral valve regurgitation. No evidence of mitral stenosis.  4. The aortic valve was not well visualized. There is mild calcification of the aortic valve. Aortic valve regurgitation is not visualized. No aortic stenosis is present. Comparison(s): No prior Echocardiogram. Conclusion(s)/Recommendation(s): Technically limited windows. RV not well seen. Overall normal LVEF and normal strain, moderate LVH. There is mild hypokinesis of the basal inferolateral wall. Grade 2 diastolic dysfunction  with elevated E/e' suggesting elevated LVEDP. IVC not seen to assess RAP. FINDINGS  Left Ventricle: Left ventricular ejection fraction, by estimation, is 55 to 60%. The left ventricle has normal function. The left ventricle demonstrates regional wall motion abnormalities. The average left ventricular global longitudinal strain is -18.8  %. Strain was performed and the global longitudinal strain is normal. The left ventricular internal cavity size was normal in size. There is moderate concentric left ventricular hypertrophy. Left ventricular diastolic parameters are consistent with Grade II diastolic dysfunction (pseudonormalization). Elevated left ventricular end-diastolic pressure. Right Ventricle: The right ventricular size is not well visualized. Right vetricular wall thickness was not well visualized. Right ventricular systolic function was not well visualized. Tricuspid regurgitation signal is inadequate for assessing PA pressure. Left Atrium: Left atrial size was normal in size. Right Atrium: Right atrial size was not well visualized. Pericardium: There is no evidence of pericardial effusion. Mitral Valve: The mitral valve is grossly normal. Trivial mitral valve regurgitation. No evidence of mitral valve stenosis. Tricuspid Valve: The tricuspid valve is not well visualized. Tricuspid valve regurgitation is trivial. No evidence of tricuspid stenosis. Aortic Valve: The aortic valve was not well visualized. There is mild calcification of the aortic valve. Aortic valve regurgitation is not visualized. No aortic  stenosis is present. Aortic valve peak gradient measures 7.0 mmHg. Pulmonic Valve: The pulmonic valve was not well visualized. Pulmonic valve regurgitation is not visualized. No evidence of pulmonic stenosis. Aorta: The ascending aorta was not well visualized and the aortic root was not well visualized. Venous: The inferior vena cava was not well visualized. IAS/Shunts: The interatrial septum was not well  visualized.  LEFT VENTRICLE PLAX 2D LVIDd:         4.20 cm   Diastology LVIDs:         3.00 cm   LV e' medial:    6.09 cm/s LV PW:         1.60 cm   LV E/e' medial:  15.3 LV IVS:        1.40 cm   LV e' lateral:   5.55 cm/s LVOT diam:     1.80 cm   LV E/e' lateral: 16.8 LV SV:         70 LV SV Index:   38        2D Longitudinal Strain LVOT Area:     2.54 cm  2D Strain GLS (A4C):   -18.3 % LV IVRT:       135 msec  2D Strain GLS (A3C):   -15.5 %                          2D Strain GLS (A2C):   -22.5 %                          2D Strain GLS Avg:     -18.8 %  PULMONARY VEINS Diastolic Velocity: 41.10 cm/s S/D Velocity:       1.60 Systolic Velocity:  65.00 cm/s LEFT ATRIUM             Index        RIGHT ATRIUM           Index LA diam:        4.30 cm 2.31 cm/m   RA Area:     14.30 cm LA Vol (A2C):   55.4 ml 29.80 ml/m  RA Volume:   37.40 ml  20.12 ml/m LA Vol (A4C):   36.0 ml 19.36 ml/m LA Biplane Vol: 45.0 ml 24.21 ml/m  AORTIC VALVE                 PULMONIC VALVE AV Area (Vmax): 2.26 cm     PV Vmax:        0.77 m/s AV Vmax:        132.00 cm/s  PV Peak grad:   2.4 mmHg AV Peak Grad:   7.0 mmHg     RVOT Peak grad: 2 mmHg LVOT Vmax:      117.00 cm/s LVOT Vmean:     68.900 cm/s LVOT VTI:       0.274 m  AORTA Ao Sinus diam: 3.50 cm MITRAL VALVE MV Area (PHT): 2.74 cm    SHUNTS MV Decel Time: 277 msec    Systemic VTI:  0.27 m MV E velocity: 93.00 cm/s  Systemic Diam: 1.80 cm MV A velocity: 84.00 cm/s MV E/A ratio:  1.11 Shelda Bruckner MD Electronically signed by Shelda Bruckner MD Signature Date/Time: 09/07/2024/10:12:37 AM    Final    CARDIAC CATHETERIZATION Result Date: 09/06/2024   Mid Cx to Dist Cx lesion is 100% stenosed.   Prox LAD to Mid LAD lesion  is 60% stenosed.   1st Diag lesion is 60% stenosed.   Mid LAD lesion is 20% stenosed.   Prox Cx to Mid Cx lesion is 30% stenosed.   Prox RCA to Mid RCA lesion is 40% stenosed.   Dist RCA lesion is 40% stenosed.   A drug-eluting stent was successfully  placed using a STENT ONYX FRONTIER 2.5X12.   Post intervention, there is a 0% residual stenosis.   As long as the patient continues to meet low risk STEMI criteria and in the absence of any other complications or medical issues, we expect the patient to be ready for discharge on 09/08/2024.   Recommend uninterrupted dual antiplatelet therapy with Aspirin  81mg  daily and Prasugrel 10mg  daily for a minimum of 12 months (ACS-Class I recommendation). 1.  Inferior STEMI due to occluded distal left circumflex.  There is also moderate diffuse LAD and RCA disease. 2.  Left ventricular angiography was not performed.  LVEDP was moderately elevated at 22 mmHg. 3.  Successful angioplasty and drug-eluting stent placement to the distal left circumflex. Recommendations: Blood pressure meds were held due to postperfusion bradycardia and hypotension.  Resume as needed for blood pressure. I requested an echocardiogram to evaluate LV systolic function.  The patient had frequent PVCs and idioventricular rhythm post reperfusion.  Will start a beta-blocker once she is no longer bradycardic.   DG Chest Port 1 View Result Date: 09/06/2024 CLINICAL DATA:  Chest pain. EXAM: PORTABLE CHEST 1 VIEW COMPARISON:  March 06, 2016 FINDINGS: The heart size and mediastinal contours are within normal limits. Low lung volumes are noted. Very mild atelectatic changes are seen within the lateral aspect of the right lung base. No focal consolidation, pleural effusion or pneumothorax is identified. Postoperative changes are present within the lower cervical spine. The visualized skeletal structures are unremarkable. IMPRESSION: Low lung volumes with very mild right basilar atelectasis. Electronically Signed   By: Suzen Dials M.D.   On: 09/06/2024 18:05       Time spent: 50 min    Sameerah Nachtigal, DO [facility] 09/07/2024, 2:34 PM    Dictation software may have been used to generate the above note. Typos may occur and escape  review in typed/dictated notes. Please contact Dr Marsa directly for clarity if needed.  Staff may message me via secure chat in Epic  but this may not receive an immediate response,  please page me for urgent matters!  If 7PM-7AM, please contact night coverage [email or web address]

## 2024-09-07 NOTE — Assessment & Plan Note (Signed)
-   Will be placed on as needed IV hydralazine and labetalol pending improvement of her blood pressure then her antihypertensives can be resumed from this morning.

## 2024-09-07 NOTE — Progress Notes (Signed)
 Rounding Note    Patient Name: Laura  [name] Date of Encounter: 09/07/2024  [facility] Cardiologist: Deatrice Cage, MD   Subjective   Came in as STEMI overnight last night, s/p DES to distal LCX. Images reviewed and discussed with patient. We had a long discussion about her social stressors. She has family that live with her, but they do not help her. She has difficulty opening her pill bottles, often ends up skipping medications because of this. It sounds as though often her blood pressure is elevated at appointments, so she is prescribed more medication, but it is not because the medication she is on is ineffective but rather that she is only taking it intermittently. I attempted to do a medication reconciliation with her, but she does not know the names of her medications.  We discussed ways that we could help improve medication adherence at home. Both having home health fill a pill box or using a pill pack service sounded like good options to the patient.  She has not had any more chest pain or shortness of breath. Reviewed plan for medications, see below.  Inpatient Medications    Scheduled Meds:  aspirin  EC  81 mg Oral Daily   atorvastatin   80 mg Oral QHS   Chlorhexidine  Gluconate Cloth  6 each Topical Daily   dapagliflozin  propanediol  10 mg Oral Daily   docusate sodium   100 mg Oral Daily   enoxaparin  (LOVENOX ) injection  40 mg Subcutaneous Q24H   insulin  aspart  0-5 Units Subcutaneous QHS   insulin  aspart  0-9 Units Subcutaneous TID WC   pantoprazole   40 mg Oral Daily   prasugrel  10 mg Oral Daily   sodium chloride  flush  3 mL Intravenous Q12H   topiramate   25 mg Oral QHS   Continuous Infusions:  sodium chloride  10 mL/hr at 09/07/24 0600   sodium chloride      PRN Meds: sodium chloride , acetaminophen , hydrALAZINE, LORazepam , nitroGLYCERIN, ondansetron  (ZOFRAN ) IV, sodium chloride  flush   Vital Signs    Vitals:   09/07/24 0616 09/07/24 0700  09/07/24 0800 09/07/24 0900  BP: (!) 158/72 (!) 157/68 (!) 112/95 (!) 161/69  Pulse: 61 (!) 58 (!) 55 (!) 53  Resp: 19 (!) 21 (!) 23 15  Temp:   (!) 97.5 F (36.4 C)   TempSrc:   Oral   SpO2: 100% 100% 100% 100%  Weight:      Height:        Intake/Output Summary (Last 24 hours) at 09/07/2024 1020 Last data filed at 09/07/2024 0600 Gross per 24 hour  Intake 620.83 ml  Output 1100 ml  Net -479.17 ml      09/06/2024    7:45 PM 06/12/2024    1:43 PM 05/23/2024    1:25 PM  Last 3 Weights  Weight (lbs) 192 lb 10.9 oz 182 lb 9.6 oz 186 lb 9.6 oz  Weight (kg) 87.4 kg 82.827 kg 84.641 kg      Telemetry    Sinus with intermittent PVCs, NSVT about 6:30 AM. There is a pattern around 10:53 AM that is concerning for frequent PVCs/NSVT, but on speaking to nurse and patient, she was moving around and very emotional while discussing her social stressors, likely both PVCs and artifact contributing to pattern - Personally Reviewed  Physical Exam   GEN: No acute distress.   Neck: No JVD Cardiac: RRR, no murmurs, rubs, or gallops.  Respiratory: Clear to auscultation bilaterally. GI: Soft, nontender, non-distended  MS:  No edema; No deformity. Neuro:  Nonfocal  Psych: Normal affect   New pertinent results (labs, ECG, imaging, cardiac studies)    Cath 09/06/24   Mid Cx to Dist Cx lesion is 100% stenosed.   Prox LAD to Mid LAD lesion is 60% stenosed.   1st Diag lesion is 60% stenosed.   Mid LAD lesion is 20% stenosed.   Prox Cx to Mid Cx lesion is 30% stenosed.   Prox RCA to Mid RCA lesion is 40% stenosed.   Dist RCA lesion is 40% stenosed.   A drug-eluting stent was successfully placed using a STENT ONYX FRONTIER 2.5X12.   Post intervention, there is a 0% residual stenosis.   As long as the patient continues to meet low risk STEMI criteria and in the absence of any other complications or medical issues, we expect the patient to be ready for discharge on 09/08/2024.   Recommend  uninterrupted dual antiplatelet therapy with Aspirin  81mg  daily and Prasugrel 10mg  daily for a minimum of 12 months (ACS-Class I recommendation).   1.  Inferior STEMI due to occluded distal left circumflex.  There is also moderate diffuse LAD and RCA disease. 2.  Left ventricular angiography was not performed.  LVEDP was moderately elevated at 22 mmHg. 3.  Successful angioplasty and drug-eluting stent placement to the distal left circumflex.   Recommendations: Blood pressure meds were held due to postperfusion bradycardia and hypotension.  Resume as needed for blood pressure. I requested an echocardiogram to evaluate LV systolic function.   The patient had frequent PVCs and idioventricular rhythm post reperfusion.  Will start a beta-blocker once she is no longer bradycardic.  Echo 09/07/24 Technically limited windows. RV not well  seen. Overall normal LVEF and normal strain, moderate LVH. There is mild  hypokinesis of the basal inferolateral wall. Grade 2 diastolic dysfunction  with elevated E/e' suggesting  elevated LVEDP. IVC not seen to assess RAP.   Assessment & Plan    STEMI 09/06/24 s/p PCI to distal LCx CAD Carotid disease Hypercholesterolemia Acute vs. Chronic diastolic heart failure PVCs -peak hsTn >24,000 -aspirin  long term, prasugrel for 1 year. Was on aspirin  and clopidogrel  prior to admission (per chart), though see below re: adherence -cath also noted elevated LVEDP, PVCs/idioventricular rhythm post perfusion. Hypokalemic on presentation as well, now improving -lp(a) pending -lipids with Tchol 133, TG 129, HDL 34, LDL 73. Goal LDL <55 given diabetes. Currently on atorvastatin  80 mg daily. Will need to discuss adding ezetimibe prior to discharge vs. PCSK9i. -personally read echo. Overall preserved LVEF and strain, with mild focal hypokinesis of basal inferolateral wall. Right side not well visualized, cannot see IVC to assess RAP, but E/e' suggests elevated LVEDP and there  is grade 2 diastolic dysfunction, suggests component of diastolic heart failure (unclear if acute or chronic, no prior imaging) -heart rate now largely above 50 with intermittent PVCs and some NSVT/IVR overnight. Will start low dose beta blocker and monitor -she is net negative about 500 cc without diuretic. Appears euvolemic. No diuretic at this time.  Hypertension -medications held post procedure due to hypotension -home medications were amlodipine  10 mg daily, chlorthalidone  25 mg daily, diltiazem  360 mg daily, nebivolol  20 mg daily, losartan  100 mg daily -should not be on two calcium  channel blockers (amlodipine  and diltiazem ) -blood pressures were elevated. Has received hydralazine IV x2 overnight. However, this AM has not had any recent meds and BP is 121/61. This agrees with her discussion with me that she is likely over-prescribed  due to medication nonadherence. Will start with one medication at a time -will restart losartan  today at a lower dose, upitrate as able. -would not restart diltiazem . Will replace nebivolol  with metoprolol for now. Will add back ARB first. Would prefer spironolactone over chlorthalidone  if renal function/potassium allow and additional medication needed. Can use amlodipine  in the future if needed, but would try to uptitrate ARB, MRA first  Type II diabetes, on long term insulin  -A1c 7.5 -management per primary team -patient listed as being on farxiga  as outpatient, but per nursing not taking. She currently has elevated lactate. Will hold on this for now and restart once lactate cleared  Lactic acidemia -has been persistent overnight. Normal LVEF, warm on exam. Normal renal and liver function. Unclear etiology. With elevated filling pressures, would be cautious with IV fluids.  Total time of encounter: I spent 55 minutes dedicated to the care of this patient on the date of this encounter to include review of records, face-to-face time with the patient discussing  conditions above, and clinical documentation with the electronic health record. We specifically spent time today discussing her test results, social stressors, barriers to medication adherence, and management of her conditions above      Signed, Shelda Bruckner, MD  09/07/2024, 10:20 AM

## 2024-09-07 NOTE — Assessment & Plan Note (Signed)
Will continue Celexa

## 2024-09-07 NOTE — Discharge Instructions (Addendum)
 Medicaid Transport: Schedule transportation for your medical appointments.   [facility] [id number] [facility] Complete Health - [facility] [id number] [facility] [place] [facility] [phone] [facility] - [facility] [phone] [facility] - One Call [phone]   Food Resources  Agency Name: [facility] Agency Address: [address], [place], [place] [address] Phone: [phone] Website: [email or web address] Service(s) Offered: Housing services, self-sufficiency, congregate meal program, weatherization program, event organiser program, emergency food assistance,  housing counseling, home ownership program, wheels - to work program.  [employer] free for 60 and older at various locations from usaa, Monday-Friday:  [employer], [address] [place], [phone] -[facility], [address] [place] [phone]  -[facility], [address] [place] [phone]  -[address], [address] [place], [phone]  Agency Name: [facility] on [facility] Address: [phone] [address], [place], [place] [address] Phone: [phone] Website: [email or web address] Service(s) Offered: Home delivered hot, frozen, and emergency  meals. Grocery assistance program which matches  volunteers one-on-one with seniors unable to grocery shop  for themselves. Must be 60 years and older; less than 20  hours of in-home aide service, limited or no driving ability;  live alone or with someone with a disability; live in  [place].  Agency Name: [facility] Address: [address], [place], [place] [address] Phone: [phone] Service(s) Offered: Food is served to shut-ins, homeless, elderly, and low income people in the community every Saturday (11:30 am-12:30 pm) and Sunday (12:30 pm-1:30pm). Volunteers also offer help and encouragement in  seeking employment,  and spiritual guidance.  Agency Name: Department [facility] Social Services Address: [address] [place], [place] [address] Phone: [phone] Service(s) Offered: Child support services; child welfare services; food stamps; Medicaid; work first family assistance; and aid with fuel,  rent, food and medicine.  Agency Name: [employer]itian Address: [address] [place], [place] Phone: [phone] Website: [email or web address] Services Offered: Monday 10:00am-12:00, 8:00pm-9:00pm, and Friday 10:00am-12:00.  Agency Name: [employer] [facility] [place] Address: [address] [place], [place] [address] Phone: [phone] Website: [email or web address] Service(s) Offered: Serves weekday meals, open from 11:30 am- 1:00 pm., and 6:30-7:30pm, Monday-Wednesday-Friday distributes food 3:30-6pm, Monday-Wednesday-Friday.  Agency Name: [facility] Address: [address], [place], [place] Phone: [phone] Website: [email or web address] Services Offered: Distributes food the 4th Saturday of the month, starting at 8:00 am  Agency Name: [facility] Address: [phone] [address] [place], [place] [address] Phone: [phone] Website: [email or web address] Service(s) Offered: Bread of life, weekly food pantry. Open Wednesdays from 10:00am-noon.  Agency Name: [facility] [employer] Address: [address] [place], [place], [place] Phone: [phone] Services Offered: Distributes food 9am-1pm, Monday-Thursday. Call for details.  Agency Name: [facility] Address: [address], [place], [place] [address] Phone: [phone] Website: [email or web address] Service(s) Offered: Games Developer. Call for assistance.  Agency Name: [facility] Address: [address] [place], [place] [address] Phone: [phone] Service Offered: Emergency Food Pantry. Call  for appointment.  Agency Name: [facility] Address: [address], [place], [place] [address] Phone: [phone] Website: [email or web address] Services Offered: Games Developer. Call for details  Agency Name: [facility] Address: [address] [place], [place] Phone: [phone] Website: [email or web address] Service(s) Offered: Emergency Food Pantry. Call for details.  Agency Name: Holiday Representative Address: [address] [place], [place] [address] Phone: [phone] or [phone] Website: [email or web address] Service(s) Offered: Distribute food 9am-11:30 am, Tuesday-Friday, and  1-3:30pm, Monday-Friday. Food pantry Monday-Friday 1pm-3pm, fresh items, Mon.-Wed.-Fri.  Agency Name: [facility] Address: [address] [place], [place] [address] Phone: [phone] Website: [email or web address] Services Offered: Distribute food Tues and Sats from 9:00am-noon. Closed 1st Saturday of each month. Call for details  Agency Name: [employer] Address: Fayrene Boatman [facility] [address] [place] [address] Phone: [phone]  Services Offered: Delivers meals every Thursday     Transportation Resources for [employer] Area  Agency Name: [facility] Agency Address: [address] [place], [place] [address] Phone: [phone] Email: [email or web address] Website: [email or web address] Service(s) Offered: Housing services, self-sufficiency, congregate meal program, weatherization program, field seismologist program, emergency food assistance,  housing counseling, home ownership program, wheels-[place]ork program.  Agency Name: [facility] [employer] [phone]) Address: [address] [place], [place] [address] Phone: [phone] Website: [email or web address] Service(s) Offered: Transportation for bluelinx, subscription and demand response;  Dial-a-Ride for citizens 21 years of age or older.  Agency Name: Department of Social Services Address: [address] [place], [place] [address] Phone: [phone] Service(s) Offered: Child support services; child welfare services; food stamps; Medicaid; work first family assistance; and aid with fuel,  rent, food and medicine, transportation assistance.  Agency Name: Disabled [employer] (DAV) Transportation  Network Phone: [phone] Service(s) Offered: Transports veterans to the [facility] medical center. Call  forty-eight hours in advance and leave the name, telephone  number, date, and time of appointment. Veteran will be  contacted by the driver the day before the appointment to  arrange a pick up point    [employer] currently provides door to door services. ACTA connects with PART daily for services to [facility]. ACTA also performs contract services to [employer] operates 27 vehicles, all but 3 mini-vans are equipped with lifts for special needs as well as the general public. ACTA drivers are each CDL certified and trained in First Aid and CPR. ACT[employer] was established in 2002 by [employer]. An independent Industrial/product Designer. ACTA operates via cytogeneticist with required local 10% match funding from [place]. ACTA provides over 80,000 passenger trips each year, including [facility] and [employer] sites.  Call at least by 11 AM one business day prior to needing transportation  [employer].                      [place], [place] [address]     Office Hours: Monday-Friday  8 AM - 5 PM

## 2024-09-08 DIAGNOSIS — I10 Essential (primary) hypertension: Secondary | ICD-10-CM | POA: Diagnosis not present

## 2024-09-08 DIAGNOSIS — E876 Hypokalemia: Secondary | ICD-10-CM | POA: Diagnosis not present

## 2024-09-08 DIAGNOSIS — I2119 ST elevation (STEMI) myocardial infarction involving other coronary artery of inferior wall: Secondary | ICD-10-CM | POA: Diagnosis not present

## 2024-09-08 DIAGNOSIS — E7800 Pure hypercholesterolemia, unspecified: Secondary | ICD-10-CM

## 2024-09-08 DIAGNOSIS — I493 Ventricular premature depolarization: Secondary | ICD-10-CM | POA: Diagnosis not present

## 2024-09-08 LAB — BASIC METABOLIC PANEL WITH GFR
Anion gap: 13 (ref 5–15)
BUN: 15 mg/dL (ref 8–23)
CO2: 23 mmol/L (ref 22–32)
Calcium: 9.1 mg/dL (ref 8.9–10.3)
Chloride: 109 mmol/L (ref 98–111)
Creatinine, Ser: 1.37 mg/dL — ABNORMAL HIGH (ref 0.44–1.00)
GFR, Estimated: 43 mL/min — ABNORMAL LOW (ref >60)
Glucose, Bld: 132 mg/dL — ABNORMAL HIGH (ref 70–99)
Potassium: 3.1 mmol/L — ABNORMAL LOW (ref 3.5–5.1)
Sodium: 143 mmol/L (ref 135–145)

## 2024-09-08 LAB — GLUCOSE, CAPILLARY
Glucose-Capillary: 220 mg/dL — ABNORMAL HIGH (ref 70–99)
Glucose-Capillary: 137 mg/dL — ABNORMAL HIGH (ref 70–99)
Glucose-Capillary: 132 mg/dL — ABNORMAL HIGH (ref 70–99)
Glucose-Capillary: 186 mg/dL — ABNORMAL HIGH (ref 70–99)
Glucose-Capillary: 199 mg/dL — ABNORMAL HIGH (ref 70–99)

## 2024-09-08 MED ORDER — LOSARTAN POTASSIUM 25 MG PO TABS
25.0000 mg | ORAL_TABLET | Freq: Once | ORAL | Status: AC
  Administered 2024-09-08: 25 mg via ORAL
  Filled 2024-09-08: qty 1

## 2024-09-08 MED ORDER — LOSARTAN POTASSIUM 50 MG PO TABS
50.0000 mg | ORAL_TABLET | Freq: Every day | ORAL | Status: DC
  Administered 2024-09-09: 50 mg via ORAL
  Filled 2024-09-08: qty 1

## 2024-09-08 MED ORDER — POTASSIUM CHLORIDE CRYS ER 20 MEQ PO TBCR
40.0000 meq | EXTENDED_RELEASE_TABLET | ORAL | Status: AC
  Administered 2024-09-08 (×2): 40 meq via ORAL
  Filled 2024-09-08 (×2): qty 2

## 2024-09-08 NOTE — Progress Notes (Signed)
 PROGRESS NOTE    Laura  A Camacho   FMW:[id number] DOB: August 29, 1961  DOA: 09/06/2024 Date of Service: 09/08/24 which is hospital day 2  PCP: Carin Gauze, NP    Hospital course / significant events:   Laura  A Camacho is a 63 y.o. African-American female with medical history significant for type II DM, HLD, fibromyalgia, GERD, HTN, ongoing tobacco abuse, who presented to the emergency room pain felt with associated, nausea and vomiting as well as diaphoresis without radiation.   10/31: EKG showed ST segment elevation in inferior leads with minor ST segment depression in V2 and therefore code STEMI was called. Cath, underwent PCI with LCA stent placement.   11/01: Cardiology following - meds adjusting today. DSS in progress, see TOC note  11/02: PVC persist, cardiology adjusting medications and continue to monitor     Consultants:  cardiology  Procedures/Surgeries: 10/13: Cardiac cath w DES      ASSESSMENT & PLAN:   Acute ST elevation myocardial infarction (STEMI) of inferior wall  S/p cath w/ stent placement  Cardiology following  aspirin  long term, prasugrel for 1 year. Was on aspirin  and clopidogrel  prior to admission (per chart), though inconsistent adherence  Low dose beta blocker   Dyslipidemia-  Goal LDL <55 given DM2 atorvastatin  80 mg daily Cardiology will further discuss adding ezetimibe prior to discharge vs. PCSK9i.   intermittent PVCs and some NSVT/IVR overnight low dose beta blocker   HFpEF - Echo w/ preserved LVEF and strain, with mild focal hypokinesis of basal inferolateral wall. Right side not well visualized, cannot see IVC to assess RAP, but E/e' suggests elevated LVEDP and there is grade 2 diastolic dysfunction, suggests component of diastolic heart failure  Cardiology following  No diuretic needed at this time    HTN Losartan  Beta blocker as above Monitor and titrate as needed Avoid diltiazem   Dc nebivolol  (home med)  Prefer  spironolactone over home chlorthalidone , consider add this   Type 2 diabetes mellitus with peripheral neuropathy Farxiga  and Neurontin . SSI   Hypokalemia Replace as needed Monitor BMP   Depression Will continue Celexa .  Social stressors  See TOC note DSS report made    Class 2 obesity based on BMI: Body mass index is 36.41 kg/m.SABRA Significantly low or high BMI is associated with higher medical risk.  Underweight - under 18  overweight - 25 to 29 obese - 30 or more Class 1 obesity: BMI of 30.0 to 34 Class 2 obesity: BMI of 35.0 to 39 Class 3 obesity: BMI of 40.0 to 49 Super Morbid Obesity: BMI 50-59 Super-super Morbid Obesity: BMI 60+ Healthy nutrition and physical activity advised as adjunct to other disease management and risk reduction treatments    DVT prophylaxis: lovenox  IV fluids: no continuous IV fluids  Nutrition: cardaic/diabetic Central lines / other devices: none  Code Status: FULL CODE ACP documentation reviewed:  none on file in [facility]  TOC needs: HH, SW Medical barriers to dispo: cardiology monitoring. Expected medical readiness for discharge tomorrow / pend cardiology clearance .              Subjective / Brief ROS:  Patient reports feelin gbetter today  Denies CP/SOB.  Pain controlled.  Denies new weakness.  Tolerating diet.  Reports no concerns w/ urination/defecation.   Family Communication: none at this time    Objective Findings:  Vitals:   09/08/24 0746 09/08/24 1151 09/08/24 1520 09/08/24 1555  BP: (!) 151/60 (!) 142/60 (!) 142/76 117/77  Pulse: 83 (!) 52  65  Resp:  18  18  Temp: 98.3 F (36.8 C) 98.4 F (36.9 C)  98.5 F (36.9 C)  TempSrc: Oral     SpO2: 98% 99%  94%  Weight:      Height:        Intake/Output Summary (Last 24 hours) at 09/08/2024 1740 Last data filed at 09/08/2024 1010 Gross per 24 hour  Intake 1080 ml  Output --  Net 1080 ml   Filed Weights   09/06/24 1945  Weight: 87.4 kg     Examination:  Physical Exam Constitutional:      General: She is not in acute distress. Cardiovascular:     Rate and Rhythm: Regular rhythm. Tachycardia present.  Pulmonary:     Breath sounds: Normal breath sounds.  Musculoskeletal:     Right lower leg: No edema.     Left lower leg: No edema.  Neurological:     Mental Status: She is alert and oriented to person, place, and time.  Psychiatric:        Mood and Affect: Mood is anxious (on interview/exam patient was yelling out very anxious about her home situation, she waws able to calm herself).          Scheduled Medications:   aspirin  EC  81 mg Oral Daily   atorvastatin   80 mg Oral QHS   docusate sodium   100 mg Oral Daily   enoxaparin  (LOVENOX ) injection  40 mg Subcutaneous Q24H   insulin  aspart  0-5 Units Subcutaneous QHS   insulin  aspart  0-9 Units Subcutaneous TID WC   [START ON 09/09/2024] losartan   50 mg Oral Daily   metoprolol tartrate  12.5 mg Oral BID   pantoprazole   40 mg Oral Daily   potassium chloride   40 mEq Oral Q4H   prasugrel  10 mg Oral Daily   sodium chloride  flush  3 mL Intravenous Q12H   topiramate   25 mg Oral QHS    Continuous Infusions:    PRN Medications:  acetaminophen , calcium  carbonate, diazepam, hydrALAZINE, nitroGLYCERIN, ondansetron  (ZOFRAN ) IV, sodium chloride  flush  Antimicrobials from admission:  Anti-infectives (From admission, onward)    None           Data Reviewed:  I have personally reviewed the following...  CBC: Recent Labs  Lab 09/06/24 1746 09/06/24 2045 09/07/24 0440  WBC 10.6* 9.1 11.0*  NEUTROABS 3.9  --   --   HGB 13.2 13.4 14.4  HCT 39.9 39.5 41.6  MCV 81.9 80.3 79.4*  PLT 411* 379 398   Basic Metabolic Panel: Recent Labs  Lab 09/06/24 1746 09/06/24 2045 09/07/24 0440 09/08/24 0609  NA 139  --  137 143  K 2.8*  --  3.6 3.1*  CL 102  --  105 109  CO2 27  --  23 23  GLUCOSE 200*  --  241* 132*  BUN 7*  --  10 15  CREATININE 1.20*  1.15* 1.10* 1.37*  CALCIUM  8.7*  --  8.8* 9.1  MG  --   --  1.9  --    GFR: Estimated Creatinine Clearance: 41.7 mL/min (A) (by C-G formula based on SCr of 1.37 mg/dL (H)). Liver Function Tests: Recent Labs  Lab 09/06/24 1746  AST 19  ALT 12  ALKPHOS 71  BILITOT 0.5  PROT 7.1  ALBUMIN 3.3*   No results for input(s): LIPASE, AMYLASE in the last 168 hours. No results for input(s): AMMONIA in the last 168 hours. Coagulation Profile: Recent Labs  Lab 09/06/24 1746  INR 1.0   Cardiac Enzymes: No results for input(s): CKTOTAL, CKMB, CKMBINDEX, TROPONINI in the last 168 hours. BNP (last 3 results) No results for input(s): PROBNP in the last 8760 hours. HbA1C: Recent Labs    09/06/24 1746  HGBA1C 7.5*   CBG: Recent Labs  Lab 09/07/24 1722 09/07/24 2114 09/08/24 0809 09/08/24 1152 09/08/24 1557  GLUCAP 151* 183* 137* 132* 186*   Lipid Profile: Recent Labs    09/06/24 1746  CHOL 133  HDL 34*  LDLCALC 73  TRIG 870  CHOLHDL 3.9   Thyroid  Function Tests: No results for input(s): TSH, T4TOTAL, FREET4, T3FREE, THYROIDAB in the last 72 hours. Anemia Panel: No results for input(s): VITAMINB12, FOLATE, FERRITIN, TIBC, IRON, RETICCTPCT in the last 72 hours. Most Recent Urinalysis On File:     Component Value Date/Time   COLORURINE YELLOW (A) 05/14/2024 0315   APPEARANCEUR HAZY (A) 05/14/2024 0315   LABSPEC 1.004 (L) 05/14/2024 0315   PHURINE 5.0 05/14/2024 0315   GLUCOSEU 50 (A) 05/14/2024 0315   HGBUR SMALL (A) 05/14/2024 0315   BILIRUBINUR NEGATIVE 05/14/2024 0315   KETONESUR NEGATIVE 05/14/2024 0315   PROTEINUR >=300 (A) 05/14/2024 0315   NITRITE NEGATIVE 05/14/2024 0315   LEUKOCYTESUR NEGATIVE 05/14/2024 0315   Sepsis Labs: @LABRCNTIP (procalcitonin:4,lacticidven:4) Microbiology: Recent Results (from the past 240 hours)  MRSA Next Gen by PCR, Nasal     Status: None   Collection Time: 09/06/24  7:51 PM   Specimen:  Nasal Mucosa; Nasal Swab  Result Value Ref Range Status   MRSA by PCR Next Gen NOT DETECTED NOT DETECTED Final    Comment: (NOTE) The GeneXpert MRSA Assay (FDA approved for NASAL specimens only), is one component of a comprehensive MRSA colonization surveillance program. It is not intended to diagnose MRSA infection nor to guide or monitor treatment for MRSA infections. Test performance is not FDA approved in patients less than 50 years old. Performed at [facility], [address] [place], [place] [address]       Radiology Studies last 3 days: ECHOCARDIOGRAM COMPLETE Result Date: 09/07/2024    ECHOCARDIOGRAM REPORT   Patient Name:   Laura Camacho Date of Exam: 09/07/2024 Medical Rec #:  [id number]           Height:       61.0 in Accession #:    [id number]          Weight:       192.7 lb Date of Birth:  11/21/1960           BSA:          1.859 m Patient Age:    63 years            BP:           157/68 mmHg Patient Gender: F                   HR:           54 bpm. Exam Location:  [facility] Procedure: 2D Echo, Cardiac Doppler, Color Doppler and Strain Analysis (Both            Spectral and Color Flow Doppler were utilized during procedure). Indications:     Acute myocardial infarction I21.9  History:         Patient has no prior history of Echocardiogram examinations.  Sonographer:     Thedora Louder RDCS, FASE Referring Phys:  [id number] FLYJFFJI A ARIDA Diagnosing Phys:  Shelda Bruckner MD  Sonographer Comments: Global longitudinal strain was attempted. IMPRESSIONS  1. Left ventricular ejection fraction, by estimation, is 55 to 60%. The left ventricle has normal function. The left ventricle demonstrates regional wall motion abnormalities (see scoring diagram/findings for description). There is moderate concentric left ventricular hypertrophy. Left ventricular diastolic parameters are consistent with Grade II diastolic dysfunction (pseudonormalization). Elevated left ventricular  end-diastolic pressure. There is hypokinesis of the left ventricular, basal inferolateral wall. The average left ventricular global longitudinal strain is -18.8 %. The global longitudinal strain is normal.  2. Right ventricular systolic function was not well visualized. The right ventricular size is not well visualized. Tricuspid regurgitation signal is inadequate for assessing PA pressure.  3. The mitral valve is grossly normal. Trivial mitral valve regurgitation. No evidence of mitral stenosis.  4. The aortic valve was not well visualized. There is mild calcification of the aortic valve. Aortic valve regurgitation is not visualized. No aortic stenosis is present. Comparison(s): No prior Echocardiogram. Conclusion(s)/Recommendation(s): Technically limited windows. RV not well seen. Overall normal LVEF and normal strain, moderate LVH. There is mild hypokinesis of the basal inferolateral wall. Grade 2 diastolic dysfunction with elevated E/e' suggesting elevated LVEDP. IVC not seen to assess RAP. FINDINGS  Left Ventricle: Left ventricular ejection fraction, by estimation, is 55 to 60%. The left ventricle has normal function. The left ventricle demonstrates regional wall motion abnormalities. The average left ventricular global longitudinal strain is -18.8  %. Strain was performed and the global longitudinal strain is normal. The left ventricular internal cavity size was normal in size. There is moderate concentric left ventricular hypertrophy. Left ventricular diastolic parameters are consistent with Grade II diastolic dysfunction (pseudonormalization). Elevated left ventricular end-diastolic pressure. Right Ventricle: The right ventricular size is not well visualized. Right vetricular wall thickness was not well visualized. Right ventricular systolic function was not well visualized. Tricuspid regurgitation signal is inadequate for assessing PA pressure. Left Atrium: Left atrial size was normal in size. Right Atrium:  Right atrial size was not well visualized. Pericardium: There is no evidence of pericardial effusion. Mitral Valve: The mitral valve is grossly normal. Trivial mitral valve regurgitation. No evidence of mitral valve stenosis. Tricuspid Valve: The tricuspid valve is not well visualized. Tricuspid valve regurgitation is trivial. No evidence of tricuspid stenosis. Aortic Valve: The aortic valve was not well visualized. There is mild calcification of the aortic valve. Aortic valve regurgitation is not visualized. No aortic stenosis is present. Aortic valve peak gradient measures 7.0 mmHg. Pulmonic Valve: The pulmonic valve was not well visualized. Pulmonic valve regurgitation is not visualized. No evidence of pulmonic stenosis. Aorta: The ascending aorta was not well visualized and the aortic root was not well visualized. Venous: The inferior vena cava was not well visualized. IAS/Shunts: The interatrial septum was not well visualized.  LEFT VENTRICLE PLAX 2D LVIDd:         4.20 cm   Diastology LVIDs:         3.00 cm   LV e' medial:    6.09 cm/s LV PW:         1.60 cm   LV E/e' medial:  15.3 LV IVS:        1.40 cm   LV e' lateral:   5.55 cm/s LVOT diam:     1.80 cm   LV E/e' lateral: 16.8 LV SV:         70 LV SV Index:   38        2D Longitudinal  Strain LVOT Area:     2.54 cm  2D Strain GLS (A4C):   -18.3 % LV IVRT:       135 msec  2D Strain GLS (A3C):   -15.5 %                          2D Strain GLS (A2C):   -22.5 %                          2D Strain GLS Avg:     -18.8 %  PULMONARY VEINS Diastolic Velocity: 41.10 cm/s S/D Velocity:       1.60 Systolic Velocity:  65.00 cm/s LEFT ATRIUM             Index        RIGHT ATRIUM           Index LA diam:        4.30 cm 2.31 cm/m   RA Area:     14.30 cm LA Vol (A2C):   55.4 ml 29.80 ml/m  RA Volume:   37.40 ml  20.12 ml/m LA Vol (A4C):   36.0 ml 19.36 ml/m LA Biplane Vol: 45.0 ml 24.21 ml/m  AORTIC VALVE                 PULMONIC VALVE AV Area (Vmax): 2.26 cm     PV  Vmax:        0.77 m/s AV Vmax:        132.00 cm/s  PV Peak grad:   2.4 mmHg AV Peak Grad:   7.0 mmHg     RVOT Peak grad: 2 mmHg LVOT Vmax:      117.00 cm/s LVOT Vmean:     68.900 cm/s LVOT VTI:       0.274 m  AORTA Ao Sinus diam: 3.50 cm MITRAL VALVE MV Area (PHT): 2.74 cm    SHUNTS MV Decel Time: 277 msec    Systemic VTI:  0.27 m MV E velocity: 93.00 cm/s  Systemic Diam: 1.80 cm MV A velocity: 84.00 cm/s MV E/A ratio:  1.11 Shelda Bruckner MD Electronically signed by Shelda Bruckner MD Signature Date/Time: 09/07/2024/10:12:37 AM    Final    CARDIAC CATHETERIZATION Result Date: 09/06/2024   Mid Cx to Dist Cx lesion is 100% stenosed.   Prox LAD to Mid LAD lesion is 60% stenosed.   1st Diag lesion is 60% stenosed.   Mid LAD lesion is 20% stenosed.   Prox Cx to Mid Cx lesion is 30% stenosed.   Prox RCA to Mid RCA lesion is 40% stenosed.   Dist RCA lesion is 40% stenosed.   A drug-eluting stent was successfully placed using a STENT ONYX FRONTIER 2.5X12.   Post intervention, there is a 0% residual stenosis.   As long as the patient continues to meet low risk STEMI criteria and in the absence of any other complications or medical issues, we expect the patient to be ready for discharge on 09/08/2024.   Recommend uninterrupted dual antiplatelet therapy with Aspirin  81mg  daily and Prasugrel 10mg  daily for a minimum of 12 months (ACS-Class I recommendation). 1.  Inferior STEMI due to occluded distal left circumflex.  There is also moderate diffuse LAD and RCA disease. 2.  Left ventricular angiography was not performed.  LVEDP was moderately elevated at 22 mmHg. 3.  Successful angioplasty and drug-eluting stent placement to the distal left circumflex.  Recommendations: Blood pressure meds were held due to postperfusion bradycardia and hypotension.  Resume as needed for blood pressure. I requested an echocardiogram to evaluate LV systolic function.  The patient had frequent PVCs and idioventricular rhythm post  reperfusion.  Will start a beta-blocker once she is no longer bradycardic.   DG Chest Port 1 View Result Date: 09/06/2024 CLINICAL DATA:  Chest pain. EXAM: PORTABLE CHEST 1 VIEW COMPARISON:  March 06, 2016 FINDINGS: The heart size and mediastinal contours are within normal limits. Low lung volumes are noted. Very mild atelectatic changes are seen within the lateral aspect of the right lung base. No focal consolidation, pleural effusion or pneumothorax is identified. Postoperative changes are present within the lower cervical spine. The visualized skeletal structures are unremarkable. IMPRESSION: Low lung volumes with very mild right basilar atelectasis. Electronically Signed   By: Suzen Dials M.D.   On: 09/06/2024 18:05       Time spent: 50 min    Jemario Poitras, DO [facility] 09/08/2024, 5:40 PM    Dictation software may have been used to generate the above note. Typos may occur and escape review in typed/dictated notes. Please contact Dr Marsa directly for clarity if needed.  Staff may message me via secure chat in Epic  but this may not receive an immediate response,  please page me for urgent matters!  If 7PM-7AM, please contact night coverage [email or web address]

## 2024-09-08 NOTE — Plan of Care (Signed)

## 2024-09-08 NOTE — Progress Notes (Signed)
 Rounding Note    Patient Name: Laura Camacho  DELENA Mainland Date of Encounter: 09/08/2024  [facility] Cardiologist: Deatrice Cage, MD   Subjective   Came in as STEMI overnight last night, s/p DES to distal LCX. Images reviewed and discussed with patient. We had a long discussion about her social stressors. She has family that live with her, but they do not help her. She has difficulty opening her pill bottles, often ends up skipping medications because of this. It sounds as though often her blood pressure is elevated at appointments, so she is prescribed more medication, but it is not because the medication she is on is ineffective but rather that she is only taking it intermittently. I attempted to do a medication reconciliation with her, but she does not know the names of her medications.  We discussed ways that we could help improve medication adherence at home. Both having home health fill a pill box or using a pill pack service sounded like good options to the patient.  She has not had any more chest pain or shortness of breath. Reviewed plan for medications, see below.  Inpatient Medications    Scheduled Meds:  aspirin  EC  81 mg Oral Daily   atorvastatin   80 mg Oral QHS   docusate sodium   100 mg Oral Daily   enoxaparin  (LOVENOX ) injection  40 mg Subcutaneous Q24H   insulin  aspart  0-5 Units Subcutaneous QHS   insulin  aspart  0-9 Units Subcutaneous TID WC   losartan   25 mg Oral Daily   metoprolol tartrate  12.5 mg Oral BID   pantoprazole   40 mg Oral Daily   prasugrel  10 mg Oral Daily   sodium chloride  flush  3 mL Intravenous Q12H   topiramate   25 mg Oral QHS   Continuous Infusions:   PRN Meds: acetaminophen , calcium  carbonate, diazepam, hydrALAZINE, nitroGLYCERIN, ondansetron  (ZOFRAN ) IV, sodium chloride  flush   Vital Signs    Vitals:   09/08/24 0042 09/08/24 0343 09/08/24 0746 09/08/24 1151  BP: (!) 112/53 (!) 108/50 (!) 151/60 (!) 142/60  Pulse: (!) 52 (!) 51  83 (!) 52  Resp: 18 18  18   Temp: 99.6 F (37.6 C) 99.2 F (37.3 C) 98.3 F (36.8 C) 98.4 F (36.9 C)  TempSrc: Oral Oral Oral   SpO2: 100% 97% 98% 99%  Weight:      Height:        Intake/Output Summary (Last 24 hours) at 09/08/2024 1334 Last data filed at 09/08/2024 1010 Gross per 24 hour  Intake 1080 ml  Output --  Net 1080 ml      09/06/2024    7:45 PM 06/12/2024    1:43 PM 05/23/2024    1:25 PM  Last 3 Weights  Weight (lbs) 192 lb 10.9 oz 182 lb 9.6 oz 186 lb 9.6 oz  Weight (kg) 87.4 kg 82.827 kg 84.641 kg      Telemetry    Sinus bradycardia with frequent PVCs and brief NSVT- Personally Reviewed  Physical Exam   GEN: Well nourished, well developed in no acute distress NECK: No JVD CARDIAC: regular rhythm, normal S1 and S2, no rubs or gallops. No murmur. VASCULAR: Radial pulses 2+ bilaterally.  RESPIRATORY:  Clear to auscultation without rales, wheezing or rhonchi  ABDOMEN: Soft, non-tender, non-distended MUSCULOSKELETAL:  Moves all 4 limbs independently SKIN: Warm and dry, no edema NEUROLOGIC:  No focal neuro deficits noted. PSYCHIATRIC:  Normal affect    New pertinent results (labs, ECG, imaging, cardiac  studies)    Cath 09/06/24   Mid Cx to Dist Cx lesion is 100% stenosed.   Prox LAD to Mid LAD lesion is 60% stenosed.   1st Diag lesion is 60% stenosed.   Mid LAD lesion is 20% stenosed.   Prox Cx to Mid Cx lesion is 30% stenosed.   Prox RCA to Mid RCA lesion is 40% stenosed.   Dist RCA lesion is 40% stenosed.   A drug-eluting stent was successfully placed using a STENT ONYX FRONTIER 2.5X12.   Post intervention, there is a 0% residual stenosis.   As long as the patient continues to meet low risk STEMI criteria and in the absence of any other complications or medical issues, we expect the patient to be ready for discharge on 09/08/2024.   Recommend uninterrupted dual antiplatelet therapy with Aspirin  81mg  daily and Prasugrel 10mg  daily for a minimum of 12  months (ACS-Class I recommendation).   1.  Inferior STEMI due to occluded distal left circumflex.  There is also moderate diffuse LAD and RCA disease. 2.  Left ventricular angiography was not performed.  LVEDP was moderately elevated at 22 mmHg. 3.  Successful angioplasty and drug-eluting stent placement to the distal left circumflex.   Recommendations: Blood pressure meds were held due to postperfusion bradycardia and hypotension.  Resume as needed for blood pressure. I requested an echocardiogram to evaluate LV systolic function.   The patient had frequent PVCs and idioventricular rhythm post reperfusion.  Will start a beta-blocker once she is no longer bradycardic.  Echo 09/07/24 Technically limited windows. RV not well  seen. Overall normal LVEF and normal strain, moderate LVH. There is mild  hypokinesis of the basal inferolateral wall. Grade 2 diastolic dysfunction  with elevated E/e' suggesting  elevated LVEDP. IVC not seen to assess RAP.   Assessment & Plan    STEMI 09/06/24 s/p PCI to distal LCx CAD Carotid disease Hypercholesterolemia Chronic diastolic heart failure PVCs -peak hsTn >24,000 -aspirin  long term, prasugrel for 1 year. Was on aspirin  and clopidogrel  prior to admission (per chart), though see below re: adherence -lp(a) pending -lipids with Tchol 133, TG 129, HDL 34, LDL 73. Goal LDL <55 given diabetes. Currently on atorvastatin  80 mg daily. Consider adding ezetimibe prior to discharge vs. PCSK9i, though given issues with adherence it is also reasonable to work on optimizing daily dosing of statin and then rechecking cholesterol as an outpatient. -Echo with overall preserved LVEF and strain, with mild focal hypokinesis of basal inferolateral wall. Right side not well visualized, cannot see IVC to assess RAP, but E/e' suggests elevated LVEDP and there is grade 2 diastolic dysfunction, suggests component of diastolic heart failure (unclear if acute or chronic, no prior  imaging) -Started on low dose beta blocker. Asymptomatic but most heart rates in the low 50s. Still having intermittent PVCs. She is asymptomatic. No room to increase beta blocker.  -she is hypokalemic to 3.1, I will replete with 80 mEq K, monitor to see if this improves PVCs -Appears euvolemic. No diuretic at this time.  Hypertension -medications held post procedure due to hypotension -home medications were listed amlodipine  10 mg daily, chlorthalidone  25 mg daily, diltiazem  360 mg daily, nebivolol  20 mg daily, losartan  100 mg daily, though was not taking routinely (see below) -should not be on two calcium  channel blockers (amlodipine  and diltiazem ) -Based on blood pressure and response to meds, as well as discussion about adherence, she is likely over-prescribed. Would start with one medication at a time, titrating  losartan  today. Need to be cautious as her blood pressure dips overnight, but is then elevated when awake. -would not restart diltiazem . Will replace nebivolol  with metoprolol for now. Would prefer spironolactone over chlorthalidone  if renal function/potassium allow and additional medication needed. Can use amlodipine  in the future if needed, but would try to uptitrate ARB, MRA first  Type II diabetes, on long term insulin  -A1c 7.5 -management per primary team -patient listed as being on farxiga  as outpatient, but per nursing not taking. Would restart when renal function stable/normalized  See my note from yesterday: there are social stressors at home, and she has also had issues with medication adherence as she cannot open bottles easily. Appreciate social work team working on ways to assist her.  Signed, Shelda Bruckner, MD  09/08/2024, 1:34 PM

## 2024-09-08 NOTE — Care Management Important Message (Signed)
 Important Message  Patient Details  Name: Laura Camacho MRN: [id number] Date of Birth: Mar 16, 1961   Important Message Given:  Yes - Medicare IM     Laura Camacho 09/08/2024, 6:00 PM

## 2024-09-09 ENCOUNTER — Other Ambulatory Visit: Payer: Self-pay

## 2024-09-09 ENCOUNTER — Encounter: Payer: Self-pay | Admitting: Cardiovascular Disease

## 2024-09-09 DIAGNOSIS — I2119 ST elevation (STEMI) myocardial infarction involving other coronary artery of inferior wall: Secondary | ICD-10-CM | POA: Diagnosis not present

## 2024-09-09 LAB — BASIC METABOLIC PANEL WITH GFR
Anion gap: 9 (ref 5–15)
BUN: 14 mg/dL (ref 8–23)
CO2: 22 mmol/L (ref 22–32)
Calcium: 8.7 mg/dL — ABNORMAL LOW (ref 8.9–10.3)
Chloride: 109 mmol/L (ref 98–111)
Creatinine, Ser: 1.5 mg/dL — ABNORMAL HIGH (ref 0.44–1.00)
GFR, Estimated: 39 mL/min — ABNORMAL LOW (ref >60)
Glucose, Bld: 181 mg/dL — ABNORMAL HIGH (ref 70–99)
Potassium: 3.7 mmol/L (ref 3.5–5.1)
Sodium: 140 mmol/L (ref 135–145)

## 2024-09-09 LAB — GLUCOSE, CAPILLARY: Glucose-Capillary: 150 mg/dL — ABNORMAL HIGH (ref 70–99)

## 2024-09-09 LAB — CG4 I-STAT (LACTIC ACID): Lactic Acid, Venous: 0.9 mmol/L (ref 0.5–1.9)

## 2024-09-09 LAB — LIPOPROTEIN A (LPA): Lipoprotein (a): 224.8 nmol/L — ABNORMAL HIGH (ref <75.0)

## 2024-09-09 LAB — MAGNESIUM: Magnesium: 1.9 mg/dL (ref 1.7–2.4)

## 2024-09-09 MED ORDER — LOSARTAN POTASSIUM 50 MG PO TABS
50.0000 mg | ORAL_TABLET | Freq: Every day | ORAL | 0 refills | Status: DC
  Filled 2024-09-09: qty 30, 30d supply, fill #0

## 2024-09-09 MED ORDER — CARVEDILOL 3.125 MG PO TABS
3.1250 mg | ORAL_TABLET | Freq: Two times a day (BID) | ORAL | 0 refills | Status: DC
  Filled 2024-09-09: qty 60, 30d supply, fill #0

## 2024-09-09 MED ORDER — CARVEDILOL 3.125 MG PO TABS: 3.1250 mg | ORAL_TABLET | Freq: Two times a day (BID) | ORAL | Status: DC

## 2024-09-09 MED ORDER — PRASUGREL HCL 10 MG PO TABS
10.0000 mg | ORAL_TABLET | Freq: Every day | ORAL | 0 refills | Status: DC
  Filled 2024-09-09: qty 90, 90d supply, fill #0

## 2024-09-09 MED ORDER — ASPIRIN 81 MG PO TBEC
81.0000 mg | DELAYED_RELEASE_TABLET | Freq: Every day | ORAL | 0 refills | Status: AC
  Filled 2024-09-09: qty 90, 90d supply, fill #0

## 2024-09-09 MED ORDER — ATORVASTATIN CALCIUM 80 MG PO TABS
80.0000 mg | ORAL_TABLET | Freq: Every day | ORAL | 0 refills | Status: DC
  Filled 2024-09-09: qty 90, 90d supply, fill #0

## 2024-09-09 MED ORDER — NITROGLYCERIN 0.4 MG SL SUBL
0.4000 mg | SUBLINGUAL_TABLET | SUBLINGUAL | 0 refills | Status: DC | PRN
  Filled 2024-09-09: qty 25, 8d supply, fill #0

## 2024-09-09 NOTE — TOC Progression Note (Signed)
 Transition of Care [facility]) - Progression Note    Patient Details  Name: Laura  [name] MRN: [id number] Date of Birth: January 25, 1961  Transition of Care [facility]) CM/SW Contact  [name], LCSW Phone Number: 09/09/2024, 9:13 AM  Clinical Narrative:  CSW called Sari with [facility]. She spoke to [place] who was the on-call social worker at DSS this weekend. Patient can discharge today and they will follow up with her at home. Attending physician is aware.   Expected Discharge Plan and Services                                               Social Drivers of Health (SDOH) Interventions SDOH Screenings   Food Insecurity: Food Insecurity Present (09/07/2024)  Housing: Unknown (09/07/2024)  Transportation Needs: Unmet Transportation Needs (09/07/2024)  Utilities: Not At Risk (09/07/2024)  Tobacco Use: High Risk (09/06/2024)    Readmission Risk Interventions     No data to display

## 2024-09-09 NOTE — Discharge Summary (Signed)
 Physician Discharge Summary   Patient: Laura Camacho MRN: [id number]  DOB: [birth year]   Admit:     Date of Admission: 09/06/2024 Admitted from: home   Discharge: Date of discharge: 09/09/2024 Disposition: Home Condition at discharge: good  CODE STATUS: FULL CODE     Discharge Physician: Laneta Blunt, DO [facility]     PCP: Carin Gauze, NP  Recommendations for Outpatient Follow-up:  Follow up with PCP Scoggins, Amber, NP in 1-2 weeks Follow as directed /w cardiogly and cardiac rehab     Discharge Instructions     AMB Referral to Cardiac Rehabilitation - Phase II   Complete by: As directed    Diagnosis:  STEMI Coronary Stents     After initial evaluation and assessments completed: Virtual Based Care may be provided alone or in conjunction with Phase 2 Cardiac Rehab based on patient barriers.: Yes   Intensive Cardiac Rehabilitation (ICR) [facility] location only OR Traditional Cardiac Rehabilitation (TCR) *If criteria for ICR are not met will enroll in TCR [facility] only): Yes   Diet - low sodium heart healthy   Complete by: As directed    Increase activity slowly   Complete by: As directed          Discharge Diagnoses: Principal Problem:   Acute ST elevation myocardial infarction (STEMI) of inferior wall (HCC) Active Problems:   Dyslipidemia   Type 2 diabetes mellitus with peripheral neuropathy (HCC)   Acute myocardial infarction of inferior wall (HCC)   Depression   Essential hypertension   Hypokalemia       Hospital course / significant events:   Laura Camacho is a 63 y.o. African-American female with medical history significant for type II DM, HLD, fibromyalgia, GERD, HTN, ongoing tobacco abuse, who presented to the emergency room pain felt with associated, nausea and vomiting as well as diaphoresis without radiation.   10/31: EKG showed ST segment elevation in inferior leads with minor ST segment depression in V2 and therefore  code STEMI was called. Cath, underwent PCI with LCA stent placement.   11/01: Cardiology following - meds adjusting today. DSS in progress, see TOC note  11/02: PVC persist, cardiology adjusting medications and continue to monitor     Consultants:  cardiology  Procedures/Surgeries: 10/13: Cardiac cath w DES      ASSESSMENT & PLAN:   Acute ST elevation myocardial infarction (STEMI) of inferior wall  S/p cath w/ stent placement  Cardiology following  aspirin  long term, prasugrel for 1 year. Was on aspirin  and clopidogrel  prior to admission (per chart), though inconsistent adherence  Low dose beta blocker   Dyslipidemia-  Goal LDL <55 given DM2 atorvastatin  80 mg daily Cardiology will further discuss adding ezetimibe prior to discharge vs. PCSK9i.   intermittent PVCs and some NSVT/IVR overnight low dose beta blocker   HFpEF - Echo w/ preserved LVEF and strain, with mild focal hypokinesis of basal inferolateral wall. Right side not well visualized, cannot see IVC to assess RAP, but E/e' suggests elevated LVEDP and there is grade 2 diastolic dysfunction, suggests component of diastolic heart failure  Cardiology following  No diuretic needed at this time    HTN Losartan  Beta blocker as above Monitor and titrate as needed Avoid diltiazem   Dc nebivolol  (home med)  Prefer spironolactone over home chlorthalidone , consider add this   Type 2 diabetes mellitus with peripheral neuropathy Farxiga  and Neurontin . SSI   Hypokalemia Replace as needed Monitor BMP   Depression Will continue Celexa .  Social stressors  See TOC note DSS report made    Class 2 obesity based on BMI: Body mass index is 36.41 kg/m.SABRA Significantly low or high BMI is associated with higher medical risk.  Underweight - under 18  overweight - 25 to 29 obese - 30 or more Class 1 obesity: BMI of 30.0 to 34 Class 2 obesity: BMI of 35.0 to 39 Class 3 obesity: BMI of 40.0 to 49 Super Morbid Obesity:  BMI 50-59 Super-super Morbid Obesity: BMI 60+ Healthy nutrition and physical activity advised as adjunct to other disease management and risk reduction treatments           Discharge Instructions  Allergies as of 09/09/2024       Reactions   Codeine Anaphylaxis   Throat swells up   Bupropion    Unknown   Cefuroxime Rash   Ciprofloxacin Rash   Fluconazole Itching   Ibuprofen  Itching   Iodine Hives   Ivp Dye [iodinated Contrast Media] Hives   Metformin And Related Itching   Nizoral [ketoconazole] Other (See Comments)   Dizzy/ Fainting   Sulfa Antibiotics Hives   Voltaren [diclofenac Sodium] Diarrhea, Nausea And Vomiting        Medication List     STOP taking these medications    amLODipine  10 MG tablet Commonly known as: NORVASC    Belsomra 10 MG Tabs Generic drug: Suvorexant   chlorthalidone  25 MG tablet Commonly known as: HYGROTON    citalopram  10 MG tablet Commonly known as: CELEXA    clopidogrel  75 MG tablet Commonly known as: PLAVIX    diltiazem  180 MG 24 hr capsule Commonly known as: DILACOR XR    Nebivolol  HCl 20 MG Tabs   ofloxacin 0.3 % ophthalmic solution Commonly known as: OCUFLOX   omeprazole  40 MG capsule Commonly known as: PRILOSEC   phenazopyridine  100 MG tablet Commonly known as: PYRIDIUM        TAKE these medications    acetaminophen  650 MG CR tablet Commonly known as: TYLENOL  Take 650 mg by mouth every 8 (eight) hours as needed for pain.   aspirin  EC 81 MG tablet Take 1 tablet (81 mg total) by mouth daily.   atorvastatin  80 MG tablet Commonly known as: LIPITOR Take 1 tablet (80 mg total) by mouth daily.   Breztri  Aerosphere 160-9-4.8 MCG/ACT Aero inhaler Generic drug: budesonide-glycopyrrolate-formoterol Inhale 2 puffs into the lungs in the morning and at bedtime.   carvedilol 3.125 MG tablet Commonly known as: COREG Take 1 tablet (3.125 mg total) by mouth 2 (two) times daily with a meal.   dapagliflozin   propanediol 10 MG Tabs tablet Commonly known as: FARXIGA  Take 1 tablet (10 mg total) by mouth daily.   dexlansoprazole  60 MG capsule Commonly known as: DEXILANT  Take 1 capsule (60 mg total) by mouth daily.   docusate sodium  100 MG capsule Commonly known as: Stool Softener Take 1 capsule (100 mg total) by mouth daily.   dorzolamide-timolol 2-0.5 % ophthalmic solution Commonly known as: COSOPT Place 1 drop into both eyes 2 (two) times daily.   DULoxetine  60 MG capsule Commonly known as: CYMBALTA  Take 1 capsule (60 mg total) by mouth daily.   ergocalciferol  1.25 MG (50000 UT) capsule Commonly known as: VITAMIN D2 Take 1 capsule (50,000 Units total) by mouth once a week.   famotidine  40 MG tablet Commonly known as: PEPCID  Take 1 tablet (40 mg total) by mouth daily.   fluticasone  50 MCG/ACT nasal spray Commonly known as: FLONASE  PLACE 1 SPRAY IN EACH NOSTRIL ONCE DAILYAS  NEEDED FOR ALLERGIES   gabapentin  300 MG capsule Commonly known as: NEURONTIN  Take 1-2 capsules (300-600 mg total) by mouth at bedtime as needed (pain-pt takes 1-2 capsules at bedtime PRN).   Lantus  SoloStar 100 UNIT/ML Solostar Pen Generic drug: insulin  glargine INJECT 35 UNITS SUBCUTANEOUSLY AT BEDTIME   losartan  50 MG tablet Commonly known as: COZAAR  Take 1 tablet (50 mg total) by mouth daily. Start taking on: September 10, 2024 What changed:  medication strength how much to take   nitroGLYCERIN 0.4 MG SL tablet Commonly known as: NITROSTAT Place 1 tablet (0.4 mg total) under the tongue every 5 (five) minutes as needed for chest pain.   prasugrel 10 MG Tabs tablet Commonly known as: EFFIENT Take 1 tablet (10 mg total) by mouth daily. Start taking on: September 10, 2024   Prodigy No Coding Blood Gluc test strip Generic drug: glucose blood Use as instructed   topiramate  25 MG tablet Commonly known as: Topamax  Take 1 tablet (25 mg total) by mouth at bedtime.   traZODone  50 MG tablet Commonly  known as: DESYREL  Take 1 tablet (50 mg total) by mouth at bedtime.   True Metrix Level 1 Low Soln         Follow-up Information     [facility]. Go on 09/19/2024.   Specialty: Cardiology Why: Hospital Follow-Up 09/19/2024 @ 9:30 AM Please bring all medications to follow-up appointment [facility], [address]' Free Valet Parking at the [employer] information: [address] [place]  [address] [phone]                Allergies  Allergen Reactions   Codeine Anaphylaxis    Throat swells up   Bupropion     Unknown   Cefuroxime Rash   Ciprofloxacin Rash   Fluconazole Itching   Ibuprofen  Itching   Iodine Hives   Ivp Dye [Iodinated Contrast Media] Hives   Metformin And Related Itching   Nizoral [Ketoconazole] Other (See Comments)    Dizzy/ Fainting   Sulfa Antibiotics Hives   Voltaren [Diclofenac Sodium] Diarrhea and Nausea And Vomiting     Subjective: pt feeling well and eager for dc home today. Denies CP/SOB, ambulating independently, tolerating diet    Discharge Exam: BP (!) 166/60 (BP Location: Left Arm)   Pulse (!) 51   Temp 98.5 F (36.9 C)   Resp 17   Ht 5' 1 (1.549 m)   Wt 87.4 kg   SpO2 97%   BMI 36.41 kg/m  General: Pt is alert, awake, not in acute distress Cardiovascular: RRR, S1/S2 +, no rubs, no gallops Respiratory: CTA bilaterally, no wheezing, no rhonchi Abdominal: Soft, NT, ND, bowel sounds + Extremities: no edema, no cyanosis     The results of significant diagnostics from this hospitalization (including imaging, microbiology, ancillary and laboratory) are listed below for reference.     Microbiology: Recent Results (from the past 240 hours)  MRSA Next Gen by PCR, Nasal     Status: None   Collection Time: 09/06/24  7:51 PM   Specimen: Nasal Mucosa; Nasal Swab  Result Value Ref Range Status   MRSA by PCR Next Gen NOT  DETECTED NOT DETECTED Final    Comment: (NOTE) The GeneXpert MRSA Assay (FDA approved for NASAL specimens only), is one component of a comprehensive MRSA colonization surveillance program. It is not intended to diagnose MRSA infection nor to guide or monitor treatment for MRSA infections.  Test performance is not FDA approved in patients less than 81 years old. Performed at [facility], [address] [place], [place] [address]      Labs: BNP (last 3 results) No results for input(s): BNP in the last 8760 hours. Basic Metabolic Panel: Recent Labs  Lab 09/06/24 1746 09/06/24 2045 09/07/24 0440 09/08/24 0609 09/09/24 0446  NA 139  --  137 143 140  K 2.8*  --  3.6 3.1* 3.7  CL 102  --  105 109 109  CO2 27  --  23 23 22   GLUCOSE 200*  --  241* 132* 181*  BUN 7*  --  10 15 14   CREATININE 1.20* 1.15* 1.10* 1.37* 1.50*  CALCIUM  8.7*  --  8.8* 9.1 8.7*  MG  --   --  1.9  --  1.9   Liver Function Tests: Recent Labs  Lab 09/06/24 1746  AST 19  ALT 12  ALKPHOS 71  BILITOT 0.5  PROT 7.1  ALBUMIN 3.3*   No results for input(s): LIPASE, AMYLASE in the last 168 hours. No results for input(s): AMMONIA in the last 168 hours. CBC: Recent Labs  Lab 09/06/24 1746 09/06/24 2045 09/07/24 0440  WBC 10.6* 9.1 11.0*  NEUTROABS 3.9  --   --   HGB 13.2 13.4 14.4  HCT 39.9 39.5 41.6  MCV 81.9 80.3 79.4*  PLT 411* 379 398   Cardiac Enzymes: No results for input(s): CKTOTAL, CKMB, CKMBINDEX, TROPONINI in the last 168 hours. BNP: Invalid input(s): POCBNP CBG: Recent Labs  Lab 09/08/24 0809 09/08/24 1152 09/08/24 1557 09/08/24 2026 09/09/24 0742  GLUCAP 137* 132* 186* 199* 150*   D-Dimer No results for input(s): DDIMER in the last 72 hours. Hgb A1c No results for input(s): HGBA1C in the last 72 hours. Lipid Profile No results for input(s): CHOL, HDL, LDLCALC, TRIG, CHOLHDL, LDLDIRECT in the last 72 hours. Thyroid  function  studies No results for input(s): TSH, T4TOTAL, T3FREE, THYROIDAB in the last 72 hours.  Invalid input(s): FREET3 Anemia work up No results for input(s): VITAMINB12, FOLATE, FERRITIN, TIBC, IRON, RETICCTPCT in the last 72 hours. Urinalysis    Component Value Date/Time   COLORURINE YELLOW (A) 05/14/2024 0315   APPEARANCEUR HAZY (A) 05/14/2024 0315   LABSPEC 1.004 (L) 05/14/2024 0315   PHURINE 5.0 05/14/2024 0315   GLUCOSEU 50 (A) 05/14/2024 0315   HGBUR SMALL (A) 05/14/2024 0315   BILIRUBINUR NEGATIVE 05/14/2024 0315   KETONESUR NEGATIVE 05/14/2024 0315   PROTEINUR >=300 (A) 05/14/2024 0315   NITRITE NEGATIVE 05/14/2024 0315   LEUKOCYTESUR NEGATIVE 05/14/2024 0315   Sepsis Labs Recent Labs  Lab 09/06/24 1746 09/06/24 2045 09/07/24 0440  WBC 10.6* 9.1 11.0*   Microbiology Recent Results (from the past 240 hours)  MRSA Next Gen by PCR, Nasal     Status: None   Collection Time: 09/06/24  7:51 PM   Specimen: Nasal Mucosa; Nasal Swab  Result Value Ref Range Status   MRSA by PCR Next Gen NOT DETECTED NOT DETECTED Final    Comment: (NOTE) The GeneXpert MRSA Assay (FDA approved for NASAL specimens only), is one component of a comprehensive MRSA colonization surveillance program. It is not intended to diagnose MRSA infection nor to guide or monitor treatment for MRSA infections. Test performance is not FDA approved in patients less than 69 years old. Performed at [facility], [address], [place], [place] [address]    Imaging ECHOCARDIOGRAM COMPLETE Result Date: 09/07/2024    ECHOCARDIOGRAM  REPORT   Patient Name:   Laura Camacho Date of Exam: 09/07/2024 Medical Rec #:  [id number]           Height:       61.0 in Accession #:    [id number]          Weight:       192.7 lb Date of Birth:  Jan 04, 1961           BSA:          1.859 m Patient Age:    63 years            BP:           157/68 mmHg Patient Gender: F                   HR:           54  bpm. Exam Location:  [facility] Procedure: 2D Echo, Cardiac Doppler, Color Doppler and Strain Analysis (Both            Spectral and Color Flow Doppler were utilized during procedure). Indications:     Acute myocardial infarction I21.9  History:         Patient has no prior history of Echocardiogram examinations.  Sonographer:     Thedora Louder RDCS, FASE Referring Phys:  [id number] FLYJFFJI A ARIDA Diagnosing Phys: Shelda Bruckner MD  Sonographer Comments: Global longitudinal strain was attempted. IMPRESSIONS  1. Left ventricular ejection fraction, by estimation, is 55 to 60%. The left ventricle has normal function. The left ventricle demonstrates regional wall motion abnormalities (see scoring diagram/findings for description). There is moderate concentric left ventricular hypertrophy. Left ventricular diastolic parameters are consistent with Grade II diastolic dysfunction (pseudonormalization). Elevated left ventricular end-diastolic pressure. There is hypokinesis of the left ventricular, basal inferolateral wall. The average left ventricular global longitudinal strain is -18.8 %. The global longitudinal strain is normal.  2. Right ventricular systolic function was not well visualized. The right ventricular size is not well visualized. Tricuspid regurgitation signal is inadequate for assessing PA pressure.  3. The mitral valve is grossly normal. Trivial mitral valve regurgitation. No evidence of mitral stenosis.  4. The aortic valve was not well visualized. There is mild calcification of the aortic valve. Aortic valve regurgitation is not visualized. No aortic stenosis is present. Comparison(s): No prior Echocardiogram. Conclusion(s)/Recommendation(s): Technically limited windows. RV not well seen. Overall normal LVEF and normal strain, moderate LVH. There is mild hypokinesis of the basal inferolateral wall. Grade 2 diastolic dysfunction with elevated E/e' suggesting elevated LVEDP. IVC not seen to assess RAP.  FINDINGS  Left Ventricle: Left ventricular ejection fraction, by estimation, is 55 to 60%. The left ventricle has normal function. The left ventricle demonstrates regional wall motion abnormalities. The average left ventricular global longitudinal strain is -18.8  %. Strain was performed and the global longitudinal strain is normal. The left ventricular internal cavity size was normal in size. There is moderate concentric left ventricular hypertrophy. Left ventricular diastolic parameters are consistent with Grade II diastolic dysfunction (pseudonormalization). Elevated left ventricular end-diastolic pressure. Right Ventricle: The right ventricular size is not well visualized. Right vetricular wall thickness was not well visualized. Right ventricular systolic function was not well visualized. Tricuspid regurgitation signal is inadequate for assessing PA pressure. Left Atrium: Left atrial size was normal in size. Right Atrium: Right atrial size was not well visualized. Pericardium: There is no evidence of pericardial effusion. Mitral Valve: The mitral valve is grossly normal. Trivial  mitral valve regurgitation. No evidence of mitral valve stenosis. Tricuspid Valve: The tricuspid valve is not well visualized. Tricuspid valve regurgitation is trivial. No evidence of tricuspid stenosis. Aortic Valve: The aortic valve was not well visualized. There is mild calcification of the aortic valve. Aortic valve regurgitation is not visualized. No aortic stenosis is present. Aortic valve peak gradient measures 7.0 mmHg. Pulmonic Valve: The pulmonic valve was not well visualized. Pulmonic valve regurgitation is not visualized. No evidence of pulmonic stenosis. Aorta: The ascending aorta was not well visualized and the aortic root was not well visualized. Venous: The inferior vena cava was not well visualized. IAS/Shunts: The interatrial septum was not well visualized.  LEFT VENTRICLE PLAX 2D LVIDd:         4.20 cm   Diastology  LVIDs:         3.00 cm   LV e' medial:    6.09 cm/s LV PW:         1.60 cm   LV E/e' medial:  15.3 LV IVS:        1.40 cm   LV e' lateral:   5.55 cm/s LVOT diam:     1.80 cm   LV E/e' lateral: 16.8 LV SV:         70 LV SV Index:   38        2D Longitudinal Strain LVOT Area:     2.54 cm  2D Strain GLS (A4C):   -18.3 % LV IVRT:       135 msec  2D Strain GLS (A3C):   -15.5 %                          2D Strain GLS (A2C):   -22.5 %                          2D Strain GLS Avg:     -18.8 %  PULMONARY VEINS Diastolic Velocity: 41.10 cm/s S/D Velocity:       1.60 Systolic Velocity:  65.00 cm/s LEFT ATRIUM             Index        RIGHT ATRIUM           Index LA diam:        4.30 cm 2.31 cm/m   RA Area:     14.30 cm LA Vol (A2C):   55.4 ml 29.80 ml/m  RA Volume:   37.40 ml  20.12 ml/m LA Vol (A4C):   36.0 ml 19.36 ml/m LA Biplane Vol: 45.0 ml 24.21 ml/m  AORTIC VALVE                 PULMONIC VALVE AV Area (Vmax): 2.26 cm     PV Vmax:        0.77 m/s AV Vmax:        132.00 cm/s  PV Peak grad:   2.4 mmHg AV Peak Grad:   7.0 mmHg     RVOT Peak grad: 2 mmHg LVOT Vmax:      117.00 cm/s LVOT Vmean:     68.900 cm/s LVOT VTI:       0.274 m  AORTA Ao Sinus diam: 3.50 cm MITRAL VALVE MV Area (PHT): 2.74 cm    SHUNTS MV Decel Time: 277 msec    Systemic VTI:  0.27 m MV E velocity: 93.00 cm/s  Systemic Diam: 1.80 cm MV  A velocity: 84.00 cm/s MV E/A ratio:  1.11 Shelda Bruckner MD Electronically signed by Shelda Bruckner MD Signature Date/Time: 09/07/2024/10:12:37 AM    Final       Time coordinating discharge: over 30 minutes  SIGNED:  Laneta Blunt DO [facility]

## 2024-09-09 NOTE — Progress Notes (Signed)
 Rounding Note    Patient Name: Laura Camacho  [name] Date of Encounter: 09/09/2024  [facility] Cardiologist: Deatrice Cage, MD   Subjective   No chest pain or shortness of breath.  Inpatient Medications    Scheduled Meds:  aspirin  EC  81 mg Oral Daily   atorvastatin   80 mg Oral QHS   carvedilol  3.125 mg Oral BID WC   docusate sodium   100 mg Oral Daily   enoxaparin  (LOVENOX ) injection  40 mg Subcutaneous Q24H   insulin  aspart  0-5 Units Subcutaneous QHS   insulin  aspart  0-9 Units Subcutaneous TID WC   losartan   50 mg Oral Daily   pantoprazole   40 mg Oral Daily   prasugrel  10 mg Oral Daily   sodium chloride  flush  3 mL Intravenous Q12H   topiramate   25 mg Oral QHS   Continuous Infusions:   PRN Meds: acetaminophen , calcium  carbonate, diazepam, hydrALAZINE, nitroGLYCERIN, ondansetron  (ZOFRAN ) IV, sodium chloride  flush   Vital Signs    Vitals:   09/08/24 2321 09/09/24 0345 09/09/24 0746 09/09/24 1109  BP: (!) 121/56 (!) 130/52 (!) 135/48 (!) 166/60  Pulse: 65 97 (!) 29 (!) 51  Resp: 17 17    Temp: 98.5 F (36.9 C) 97.6 F (36.4 C) 98.4 F (36.9 C) 98.5 F (36.9 C)  TempSrc:      SpO2: 98% 99% 100% 97%  Weight:      Height:        Intake/Output Summary (Last 24 hours) at 09/09/2024 1354 Last data filed at 09/09/2024 0400 Gross per 24 hour  Intake 900 ml  Output --  Net 900 ml      09/06/2024    7:45 PM 06/12/2024    1:43 PM 05/23/2024    1:25 PM  Last 3 Weights  Weight (lbs) 192 lb 10.9 oz 182 lb 9.6 oz 186 lb 9.6 oz  Weight (kg) 87.4 kg 82.827 kg 84.641 kg      Telemetry    Sinus bradycardia with frequent PVCs and brief NSVT- Personally Reviewed  Physical Exam   GEN: Well nourished, well developed in no acute distress NECK: No JVD CARDIAC: regular rhythm, normal S1 and S2, no rubs or gallops. No murmur. VASCULAR: Radial pulses 2+ bilaterally.  RESPIRATORY:  Clear to auscultation without rales, wheezing or rhonchi  ABDOMEN: Soft,  non-tender, non-distended MUSCULOSKELETAL:  Moves all 4 limbs independently SKIN: Warm and dry, no edema NEUROLOGIC:  No focal neuro deficits noted. PSYCHIATRIC:  Normal affect    New pertinent results (labs, ECG, imaging, cardiac studies)    Cath 09/06/24   Mid Cx to Dist Cx lesion is 100% stenosed.   Prox LAD to Mid LAD lesion is 60% stenosed.   1st Diag lesion is 60% stenosed.   Mid LAD lesion is 20% stenosed.   Prox Cx to Mid Cx lesion is 30% stenosed.   Prox RCA to Mid RCA lesion is 40% stenosed.   Dist RCA lesion is 40% stenosed.   A drug-eluting stent was successfully placed using a STENT ONYX FRONTIER 2.5X12.   Post intervention, there is a 0% residual stenosis.   As long as the patient continues to meet low risk STEMI criteria and in the absence of any other complications or medical issues, we expect the patient to be ready for discharge on 09/08/2024.   Recommend uninterrupted dual antiplatelet therapy with Aspirin  81mg  daily and Prasugrel 10mg  daily for a minimum of 12 months (ACS-Class I recommendation).  1.  Inferior STEMI due to occluded distal left circumflex.  There is also moderate diffuse LAD and RCA disease. 2.  Left ventricular angiography was not performed.  LVEDP was moderately elevated at 22 mmHg. 3.  Successful angioplasty and drug-eluting stent placement to the distal left circumflex.   Recommendations: Blood pressure meds were held due to postperfusion bradycardia and hypotension.  Resume as needed for blood pressure. I requested an echocardiogram to evaluate LV systolic function.   The patient had frequent PVCs and idioventricular rhythm post reperfusion.  Will start a beta-blocker once she is no longer bradycardic.  Echo 09/07/24 Technically limited windows. RV not well  seen. Overall normal LVEF and normal strain, moderate LVH. There is mild  hypokinesis of the basal inferolateral wall. Grade 2 diastolic dysfunction  with elevated E/e' suggesting   elevated LVEDP. IVC not seen to assess RAP.   Assessment & Plan    1.  STEMI 09/06/24 s/p PCI to distal LCx She does have residual moderate disease involving the LAD and RCA which will be treated medically for now.  Recommend aggressive treatment of risk factors and cardiac rehab.  I discussed this with the patient today. -peak hsTn >24,000 Continue aspirin  indefinitely and prasugrel for at least 12 months.  I discussed with her the importance of compliance.  2.  Frequent PVCs and idioventricular rhythm post revascularization: She is only able to tolerate small dose beta-blocker due to baseline bradycardia.  I elected to switch small dose Toprol to carvedilol 3.125 mg twice daily.  3.  Essential hypertension: Blood pressure seems to be reasonably controlled on current medications including losartan  50 mg once daily.  4.  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  She appears to be euvolemic now.  The patient is stable for discharge from a cardiac standpoint.  Will arrange follow-up in our office in 1 to 2 weeks.    Signed,  Deatrice Cage, MD  09/09/2024, 1:54 PM

## 2024-09-09 NOTE — Plan of Care (Signed)

## 2024-09-09 NOTE — TOC Transition Note (Signed)
 Transition of Care [facility]) - Discharge Note   Patient Details  Name: Laura  FRITZI Camacho MRN: [id number] Date of Birth: May 23, 1961  [facility] of [facility]) CM/SW Contact:  [name], LCSW Phone Number: 09/09/2024, 11:38 AM   Clinical Narrative:  Patient has orders to discharge home today. Family member is at bedside for transport. No further concerns. CSW signing off.   Final next level of care: Home/Self Care Barriers to Discharge: Barriers Resolved   Patient Goals and CMS Choice            Discharge Placement                Patient to be transferred to facility by: Family member at bedside   Patient and family notified of of transfer: 09/09/24  Discharge Plan and Services Additional resources added to the After Visit Summary for                                       Social Drivers of Health (SDOH) Interventions SDOH Screenings   Food Insecurity: Food Insecurity Present (09/07/2024)  Housing: Unknown (09/07/2024)  Transportation Needs: Unmet Transportation Needs (09/07/2024)  Utilities: Not At Risk (09/07/2024)  Tobacco Use: High Risk (09/06/2024)     Readmission Risk Interventions     No data to display

## 2024-09-09 NOTE — Progress Notes (Signed)
 Heart Failure Nurse Navigator Progress Note  PCP: Carin Gauze, NP PCP-Cardiologist: Deatrice Cage, MD Admission Diagnosis: ST elevation myocardial infarction (STEMI), unspecified artery (HCC) Chest pain of uncertain etiology Secondary hypertension  Contrast media allergy  Admitted from: Home  Presentation:   Laura Camacho is a 63 year old female. Patient has a history of HTN and Diabetes.  She presented to the ED with chest pain for 1 hour.  Pain was constant and crampy and per notes nothing like she had ever experienced pain like that before.  She also had shortness of breath and nausea with no history of heart problems. BP 180/70, Pulse 57, 99% RA. HS-Troponin 36, and Peak was >24,000 on recheck. Lactic Acid 3.0. Chest x-ray: Low lung volumes with very mild right basilar atelectasis.   ECHO/ LVEF: 55-60% Grade II Diastolic Dysfunction (pseudonormalization) Heart Cath 09/06/2024  Clinical Course:  Past Medical History:  Diagnosis Date   Complication of anesthesia    STATES WAS TOLD NOT TO TAKE UNKNOWN ANESTHESIA AFTER NECKFUSION OR HYSTERECTOMY   Diabetes mellitus without complication (HCC)    Dyslipidemia 05/05/2017   Epilepsy (HCC)    Fibromyalgia    GERD (gastroesophageal reflux disease)    Glaucoma (increased eye pressure)    History of Glaucoma but no drops   Headache    Hyperlipidemia    Hypertension    Neurofibromatosis (HCC)    Pyelonephritis      Social History   Socioeconomic History   Marital status: Single    Spouse name: Not on file   Number of children: Not on file   Years of education: Not on file   Highest education level: Not on file  Occupational History   Not on file  Tobacco Use   Smoking status: Every Day    Current packs/day: 0.50    Types: Cigarettes   Smokeless tobacco: Never   Tobacco comments:    0.5 ppd 06/09/2021  Vaping Use   Vaping status: Never Used  Substance and Sexual Activity   Alcohol use: Yes    Alcohol/week: 3.0  - 4.0 standard drinks of alcohol    Types: 3 - 4 Shots of liquor per week    Comment: once a week   Drug use: Not Currently    Types: Marijuana    Comment: monthly   Sexual activity: Yes    Birth control/protection: Surgical  Other Topics Concern   Not on file  Social History Narrative   Not on file   Social Drivers of Health   Financial Resource Strain: Not on file  Food Insecurity: Food Insecurity Present (09/07/2024)   Hunger Vital Sign    Worried About Running Out of Food in the Last Year: Often true    Ran Out of Food in the Last Year: Often true  Transportation Needs: Unmet Transportation Needs (09/07/2024)   PRAPARE - Administrator, Civil Service (Medical): Yes    Lack of Transportation (Non-Medical): Yes  Physical Activity: Not on file  Stress: Not on file  Social Connections: Not on file   Education Assessment and Provision:  Detailed education and instructions provided on heart failure disease management including the following:  Signs and symptoms of Heart Failure When to call the physician Importance of daily weights Low sodium diet Fluid restriction Medication management Anticipated future follow-up appointments  Patient education given on each of the above topics.  Patient acknowledges understanding via teach back method and acceptance of all instructions.  Education Materials:  Living Better With Heart Failure Booklet, HF zone tool, & Daily Weight Tracker Tool.  Patient has scale at home: No.  I provided her with one and she was able to see the numbers on the screen. Patient has pill box at home: No.  She was provided with one before discharge.     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    Demonstrates medication adherence: No.  Do to inability to prepare her medications properly .  Primary Language: English Literacy level: Reading, Writing, and Comprehension  Barriers of Care:   Daily Weights-patient was provided with a new scale to perform daily weights.  Verified that patient was able to see the numbers on the screen when I dropped it off. 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  Considerations/Referrals:  Referral made to Heart Failure Pharmacist Stewardship: Yes.  Floor Pharmacist  prepared patients first week of medications in a new bill box for her when d/c medications were sent to the patient for discharge.   Referral made to Heart Failure CSW/NCM TOC: No Referral made to Heart & Vascular TOC clinic: New TOC 09/19/2024 @ 9:30  Items for Follow-up on DC/TOC: Daily Weights-Scale provided to her and pill box Diet & Fluid Restrictions Medication Non-Compliance Review Heart Failure Education again with patient and family member that brings her to the [facility] appt. Family support needed for medication organization and delivery of medications to patient to help with compliance.  Suggested to the pharmacy at discharge that pre-pill packaging be set up for patient compliance with her next refill.     Charmaine Pines, RN, BSN [facility] Heart Failure Navigator Secure Chat Only

## 2024-09-10 ENCOUNTER — Telehealth: Payer: Self-pay | Admitting: Cardiovascular Disease

## 2024-09-10 NOTE — Telephone Encounter (Signed)
-----   Message from [place] sent at 09/09/2024  2:11 PM EST ----- TOC follow-up needed with me or APP in 1 to 2 weeks post inferior STEMI.

## 2024-09-10 NOTE — Telephone Encounter (Signed)
 LVM to schedule appt

## 2024-09-12 ENCOUNTER — Telehealth: Payer: Self-pay | Admitting: Cardiovascular Disease

## 2024-09-12 NOTE — Telephone Encounter (Signed)
 Left another VM to schedule fu appt

## 2024-09-12 NOTE — Telephone Encounter (Signed)
 Call pt for a follow up appointment per Dr. Darron TOC follow-up needed with me or APP in 1 to 2 weeks post inferior STEMI.

## 2024-09-16 LAB — HELIX PHARMACOGENOMICS (PGX) CLOPIDOGREL TEST

## 2024-09-18 ENCOUNTER — Telehealth: Payer: Self-pay | Admitting: Family
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
## 2024-09-18 NOTE — Telephone Encounter (Signed)
 Called to confirm/remind patient of their appointment at the Advanced Heart Failure Clinic on 09/19/24.   Appointment:   [] Confirmed  [x] Left mess   [] No answer/No voice mail  [] VM Full/unable to leave message  [] Phone not in service  Patient reminded to bring all medications and/or complete list.  Confirmed patient has transportation. Gave directions, instructed to utilize valet parking.

## 2024-09-19 ENCOUNTER — Encounter: Payer: Self-pay | Admitting: Family

## 2024-09-19 ENCOUNTER — Encounter: Payer: Self-pay | Admitting: Medical

## 2024-09-19 ENCOUNTER — Other Ambulatory Visit (HOSPITAL_COMMUNITY): Payer: Self-pay

## 2024-09-19 ENCOUNTER — Other Ambulatory Visit: Payer: Self-pay | Admitting: Cardiology

## 2024-09-19 ENCOUNTER — Telehealth (HOSPITAL_BASED_OUTPATIENT_CLINIC_OR_DEPARTMENT_OTHER): Payer: Self-pay | Admitting: Licensed Clinical Social Worker

## 2024-09-19 ENCOUNTER — Telehealth: Payer: Self-pay | Admitting: Cardiovascular Disease

## 2024-09-19 ENCOUNTER — Ambulatory Visit: Attending: Medical | Admitting: Medical

## 2024-09-19 ENCOUNTER — Inpatient Hospital Stay: Admitting: Family

## 2024-09-19 VITALS — BP 162/88 | HR 68 | Wt 181.4 lb

## 2024-09-19 VITALS — BP 138/80 | HR 59 | Ht 60.0 in | Wt 182.2 lb

## 2024-09-19 DIAGNOSIS — I5032 Chronic diastolic (congestive) heart failure: Secondary | ICD-10-CM | POA: Diagnosis not present

## 2024-09-19 DIAGNOSIS — I2510 Atherosclerotic heart disease of native coronary artery without angina pectoris: Secondary | ICD-10-CM | POA: Diagnosis not present

## 2024-09-19 DIAGNOSIS — E782 Mixed hyperlipidemia: Secondary | ICD-10-CM | POA: Diagnosis not present

## 2024-09-19 DIAGNOSIS — I5022 Chronic systolic (congestive) heart failure: Secondary | ICD-10-CM

## 2024-09-19 DIAGNOSIS — E1142 Type 2 diabetes mellitus with diabetic polyneuropathy: Secondary | ICD-10-CM

## 2024-09-19 DIAGNOSIS — I10 Essential (primary) hypertension: Secondary | ICD-10-CM

## 2024-09-19 DIAGNOSIS — Z720 Tobacco use: Secondary | ICD-10-CM | POA: Diagnosis not present

## 2024-09-19 DIAGNOSIS — Z79899 Other long term (current) drug therapy: Secondary | ICD-10-CM | POA: Diagnosis not present

## 2024-09-19 MED ORDER — ATORVASTATIN CALCIUM 80 MG PO TABS
80.0000 mg | ORAL_TABLET | Freq: Every day | ORAL | 0 refills | Status: DC
  Filled 2024-09-19: qty 90, 90d supply, fill #0

## 2024-09-19 MED ORDER — NITROGLYCERIN 0.4 MG SL SUBL: 0.4000 mg | SUBLINGUAL_TABLET | SUBLINGUAL | 11 refills | Status: AC | PRN

## 2024-09-19 MED ORDER — CARVEDILOL 3.125 MG PO TABS
3.1250 mg | ORAL_TABLET | Freq: Two times a day (BID) | ORAL | 0 refills | Status: DC
  Filled 2024-09-19: qty 60, 30d supply, fill #0

## 2024-09-19 MED ORDER — LOSARTAN POTASSIUM 50 MG PO TABS
50.0000 mg | ORAL_TABLET | Freq: Every day | ORAL | 0 refills | Status: DC
  Filled 2024-09-19: qty 30, 30d supply, fill #0

## 2024-09-19 MED ORDER — PRASUGREL HCL 10 MG PO TABS
10.0000 mg | ORAL_TABLET | Freq: Every day | ORAL | 0 refills | Status: AC
  Filled 2024-09-19: qty 90, 90d supply, fill #0

## 2024-09-19 MED ORDER — DAPAGLIFLOZIN PROPANEDIOL 10 MG PO TABS
10.0000 mg | ORAL_TABLET | Freq: Every day | ORAL | 3 refills | Status: DC
  Filled 2024-09-19 – 2024-09-26 (×2): qty 90, 90d supply, fill #0
  Filled 2024-09-26: qty 30, 30d supply, fill #0

## 2024-09-19 NOTE — Telephone Encounter (Signed)
*  STAT* If patient is at the pharmacy, call can be transferred to refill team.   1. Which medications need to be refilled? (please list name of each medication and dose if known)   nitroGLYCERIN (NITROSTAT) 0.4 MG SL tablet     2. Would you like to learn more about the convenience, safety, & potential cost savings by using the [facility]?      3. Are you open to using the [facility] (Type [facility].    4. Which pharmacy/location (including street and city if local pharmacy) is medication to be sent to? [id number] - [place] ([place]), [place] - [address]     5. Do they need a 30 day or 90 day supply? 1 vial

## 2024-09-19 NOTE — Telephone Encounter (Signed)
 H&V Care Navigation CSW Progress Note  Clinical Social Worker received referral from pharmacist Kristin A regarding possible assistance for medication for pt. Referral states pt doesn't have any money for her medications, was d/c without them. LCSW completed chart review. Her meds should be $0 since she has [employer] and Medicaid.  Noted that cost concerns were discussed with Ellouise, NP, at her HF appt today and sent them to [facility] for delivery. Unsure why it says never dispensed. Cc'd everyone on message for collaboration and reached out to Genevia Bouton, PharmD, with adherence packaging/mail delivery team.   Noted APS/CPS report had been made when pt inpatient due to home environment, note denied any issues with provider today. Once we sort out medication costs/if can be delivered then will f/u with pt regarding next steps.    Patient is participating in a Managed Medicaid Plan:  No, [employer].   SDOH Screenings   Food Insecurity: Food Insecurity Present (09/07/2024)  Housing: Unknown (09/07/2024)  Transportation Needs: Unmet Transportation Needs (09/07/2024)  Utilities: Not At Risk (09/07/2024)  Tobacco Use: High Risk (09/19/2024)     Marit Lark, MSW, LCSW Clinical Social Worker II [facility] Heart/Vascular Care Navigation  [phone]- work cell phone (preferred)

## 2024-09-19 NOTE — Telephone Encounter (Signed)
 Pt's medication was sent to pt's pharmacy as requested. Confirmation received.

## 2024-09-19 NOTE — Progress Notes (Signed)
 Cardiology Office Note   Date:  09/19/2024  ID:  Laura  A Camacho, DOB 1961/06/01, MRN [id number] PCP: Carin Gauze, NP  [facility] Providers Cardiologist:  Deatrice Cage, MD  History of Present Illness Laura Camacho is a 63 y.o. female with STEMI/CAD s/p PCI distal Lcx, frequent PVCs, HTN, HFpEF, DM2, fibromyalgia, GERD, tobacco use who presents for hospital follow-up.   Patient presented 09/06/24 with chest pains found to have STEMI in the inferior leads.  Patient was taken urgently to cath and underwent PCI/DES placement to the left circumflex.  Patient was started on DAPT with aspirin  and Plavix  for 1 year.  She was started on low-dose beta-blocker and Lipitor.  Echo showed EF 55 to 60%, grade 2 diastolic dysfunction.  Today, the patient is overall doing Ok. She has not been taking any heart medications because she was unclear of the directions. She did even go bye the pharmacy. The patient lives with her granddaughter and is unable to driver herself places. She is accompanied by another granddaughter. She denies chest pain. She has minimal SOB. No lower leg edema, lightheadedness or dizziness, palpitations. She is smoking 1 pack every 2 days.  Studies Reviewed EKG Interpretation Date/Time:  Thursday September 19 2024 11:31:47 EST Ventricular Rate:  59 PR Interval:  142 QRS Duration:  86 QT Interval:  450 QTC Calculation: 445 R Axis:   -33  Text Interpretation: Sinus bradycardia Left axis deviation Nonspecific T wave abnormality When compared with ECG of 06-Sep-2024 17:47, QRS axis Shifted left ST depression has replaced ST elevation in Inferior leads Nonspecific T wave abnormality now evident in Inferior leads Nonspecific T wave abnormality has replaced inverted T waves in Lateral leads QT has lengthened Confirmed by Franchester, Paytience Bures ([id number]) on 09/19/2024 11:34:54 AM    Cath 09/06/24   Mid Cx to Dist Cx lesion is 100% stenosed.   Prox LAD to Mid LAD lesion is 60%  stenosed.   1st Diag lesion is 60% stenosed.   Mid LAD lesion is 20% stenosed.   Prox Cx to Mid Cx lesion is 30% stenosed.   Prox RCA to Mid RCA lesion is 40% stenosed.   Dist RCA lesion is 40% stenosed.   A drug-eluting stent was successfully placed using a STENT ONYX FRONTIER 2.5X12.   Post intervention, there is a 0% residual stenosis.   As long as the patient continues to meet low risk STEMI criteria and in the absence of any other complications or medical issues, we expect the patient to be ready for discharge on 09/08/2024.   Recommend uninterrupted dual antiplatelet therapy with Aspirin  81mg  daily and Prasugrel 10mg  daily for a minimum of 12 months (ACS-Class I recommendation).   1.  Inferior STEMI due to occluded distal left circumflex.  There is also moderate diffuse LAD and RCA disease. 2.  Left ventricular angiography was not performed.  LVEDP was moderately elevated at 22 mmHg. 3.  Successful angioplasty and drug-eluting stent placement to the distal left circumflex.   Recommendations: Blood pressure meds were held due to postperfusion bradycardia and hypotension.  Resume as needed for blood pressure. I requested an echocardiogram to evaluate LV systolic function.   The patient had frequent PVCs and idioventricular rhythm post reperfusion.  Will start a beta-blocker once she is no longer bradycardic.   Echo 09/07/24 Technically limited windows. RV not well  seen. Overall normal LVEF and normal strain, moderate LVH. There is mild  hypokinesis of the basal inferolateral wall. Grade 2 diastolic dysfunction  with elevated E/e' suggesting  elevated LVEDP. IVC not seen to assess RAP.   Physical Exam VS:  BP 138/80 (BP Location: Left Arm, Patient Position: Sitting, Cuff Size: Normal)   Pulse (!) 59   Ht 5' (1.524 m)   Wt 182 lb 4 oz (82.7 kg)   SpO2 98%   BMI 35.59 kg/m        Wt Readings from Last 3 Encounters:  09/19/24 182 lb 4 oz (82.7 kg)  09/19/24 181 lb 6.4 oz (82.3  kg)  09/06/24 192 lb 10.9 oz (87.4 kg)    GEN: Well nourished, well developed in no acute distress NECK: No JVD; No carotid bruits CARDIAC: RRR, no murmurs, rubs, gallops RESPIRATORY:  Clear to auscultation without rales, wheezing or rhonchi  ABDOMEN: Soft, non-tender, non-distended EXTREMITIES:  No edema; No deformity   ASSESSMENT AND PLAN  CAD  Recent STEMI treated with PCI/DES to distal Lcx. She was started on ASA and Effient x 12 months. Since the discharge she has not had ANY of her cardiac medications. She says she did not understand the instructions and never went to the pharmacy to ask about her meds. She says she is unable to afford any medications.According the records all her meds were send to [facility] pharmacy.  I will consult RX assistance and social work for medication assistance. May need to change Effient to plavix  at some point. She denies chest pain, but has persistent DOE. I Recommend she go buy and pick up what she can. Continue ASA, Effient, Lipitor, Coreg, Losartan , SL NTG. I will refer to cardiac rehab, hopefully she can get her meds prior to this.   HFmrEF Cath showed mildly reduced LVEF (per Dr. Darron), but normal by echo. She is being followed by CHF team. Patient is not on any of her medications as above. We will assist in getting Jardiance, Coreg and Losartan . She appears euvolemic.   HTN BP is mildly elevated. Continue Coreg and Losartan  once obtained.   HLD LDL 73, goal<73. Continue Lipitor 80mg  daily once obtained.     Cardiac Rehabilitation Eligibility Assessment  The patient is ready to start cardiac rehabilitation from a cardiac standpoint.       Dispo: Follow-up in 1 month  Signed, Elwyn Klosinski VEAR Fishman, PA-C

## 2024-09-19 NOTE — Patient Instructions (Signed)
 Medication Changes:  You medications have been sent to [facility] long outpatient pharmacy. They will put them in a pill pack and ship them to your home.  Lab Work:   Go downstairs to [facility] on [address] to have your blood work completed. WHILE YOU ARE THERE, PLEASE MAKE YOUR FOLLOW UP APPOINTMENT.   We will only call you if the results are abnormal or if the provider would like to make medication changes.  No news is good news.   Special Instructions // Education:  Please bring all of your medication to your next appointment   Follow-Up in: Please follow up with the [facility] in 1 month with Ellouise Class, FNP.   Thank you for choosing [facility].    At the [facility], you and your health needs are our priority. We have a designated team specialized in the treatment of Heart Failure. This Care Team includes your primary Heart Failure Specialized Cardiologist (physician), Advanced Practice Providers (APPs- Physician Assistants and Nurse Practitioners), and Pharmacist who all work together to provide you with the care you need, when you need it.   You may see any of the following providers on your designated Care Team at your next follow up:  Dr. Toribio Fuel Dr. Ezra Shuck Dr. Ria Commander Dr. Morene Brownie Ellouise Class, FNP Jaun Bash, RPH-CPP  Please be sure to bring in all your medications bottles to every appointment.   Need to Contact Us :  If you have any questions or concerns before your next appointment please send us  a message through [place] or call our office at [phone].    TO LEAVE A MESSAGE FOR THE NURSE SELECT OPTION 2, PLEASE LEAVE A MESSAGE INCLUDING: YOUR NAME DATE OF BIRTH CALL BACK NUMBER REASON FOR CALL**this is important as we prioritize the call backs  YOU WILL RECEIVE A CALL BACK THE SAME DAY AS LONG AS YOU CALL BEFORE 4:00 PM

## 2024-09-19 NOTE — Patient Instructions (Signed)
 Medication Instructions:   Your physician recommends that you continue on your current medications as directed. Please refer to the Current Medication list given to you today.    *If you need a refill on your cardiac medications before your next appointment, please call your pharmacy*  Lab Work:  None ordered at this time   If you have labs (blood work) drawn today and your tests are completely normal, you will receive your results only by:  MyChart Message (if you have MyChart) OR  A paper copy in the mail If you have any lab test that is abnormal or we need to change your treatment, we will call you to review the results.  Testing/Procedures:  None ordered at this time   Referrals:  Your cardiologist has referred you to Social Work  We have attached their office location and phone number below.  Please allow them 3-5 business days to reach out to you to make an appointment.  If you have not heard from their office within that time, please call them to schedule your appointment.    Follow-Up:  At [facility], you and your health needs are our priority.  As part of our continuing mission to provide you with exceptional heart care, our providers are all part of one team.  This team includes your primary Cardiologist (physician) and Advanced Practice Providers or APPs (Physician Assistants and Nurse Practitioners) who all work together to provide you with the care you need, when you need it.  Your next appointment:   1 month(s)  Provider:    You may see Deatrice Cage, MD or one of the following Advanced Practice Providers on your designated Care Team:   Lonni Meager, NP Lesley Maffucci, PA-C Bernardino Bring, PA-C Cadence [place], PA-C Tylene Lunch, NP Barnie Hila, NP    We recommend signing up for the patient portal called MyChart.  Sign up information is provided on this After Visit Summary.  MyChart is used to connect with patients for Virtual Visits  (Telemedicine).  Patients are able to view lab/test results, encounter notes, upcoming appointments, etc.  Non-urgent messages can be sent to your provider as well.   To learn more about what you can do with MyChart, go to [email or web address].

## 2024-09-20 ENCOUNTER — Ambulatory Visit: Payer: Self-pay | Admitting: Family

## 2024-09-20 ENCOUNTER — Other Ambulatory Visit: Payer: Self-pay

## 2024-09-20 ENCOUNTER — Telehealth: Payer: Self-pay | Admitting: Licensed Clinical Social Worker

## 2024-09-20 LAB — BASIC METABOLIC PANEL WITH GFR
BUN/Creatinine Ratio: 7 — ABNORMAL LOW (ref 12–28)
BUN: 9 mg/dL (ref 8–27)
CO2: 25 mmol/L (ref 20–29)
Calcium: 9.9 mg/dL (ref 8.7–10.3)
Chloride: 106 mmol/L (ref 96–106)
Creatinine, Ser: 1.22 mg/dL — ABNORMAL HIGH (ref 0.57–1.00)
Glucose: 83 mg/dL (ref 70–99)
Potassium: 4.2 mmol/L (ref 3.5–5.2)
Sodium: 144 mmol/L (ref 134–144)
eGFR: 50 mL/min/1.73 — ABNORMAL LOW (ref >59)

## 2024-09-20 NOTE — Telephone Encounter (Signed)
 H&V Care Navigation CSW Progress Note  Clinical Social Worker contacted patient by phone to f/u on concerns related to finances and medication access. No answer today at [phone]. Left voicemail and encouraged her to reach out to me or adherence pharmacy about medications, current test bill copay is $0 per pharmacist Sushama. Will re-attempt again as able.  Patient is participating in a Managed Medicaid Plan:  No, Laura Camacho and Medicaid  SDOH Screenings   Food Insecurity: Food Insecurity Present (09/07/2024)  Housing: Unknown (09/07/2024)  Transportation Needs: Unmet Transportation Needs (09/07/2024)  Utilities: Not At Risk (09/07/2024)  Tobacco Use: High Risk (09/19/2024)    Laura Camacho, MSW, LCSW Clinical Social Worker II [facility] Heart/Vascular Care Navigation  [phone]- work cell phone (preferred)

## 2024-09-23 ENCOUNTER — Other Ambulatory Visit (HOSPITAL_COMMUNITY): Payer: Self-pay

## 2024-09-23 ENCOUNTER — Telehealth: Payer: Self-pay | Admitting: Licensed Clinical Social Worker

## 2024-09-23 ENCOUNTER — Telehealth: Payer: Self-pay | Admitting: Pharmacy Technician

## 2024-09-23 NOTE — Telephone Encounter (Signed)
    Patient has insurance     Ran test claim for farxiga . For a 90 day supply and the co-pay is 0.00 . PA is not needed at this time.   Effient too soon until 11/16/24 -just got 09/09/24  Losartan  too soon until 10/02/24 just got 09/09/24  Carvedilol too soon NEXT AVAILABLE FILL DATE [id number], LAST FILL DT [id number]  Atorvastatin  too soon NEXT AVAILABLE FILL DATE [id number], LAST FILL DT [id number]    Lmom for patient

## 2024-09-23 NOTE — Telephone Encounter (Signed)
 Received note she needed assistance for medications but:  Ran test claim for farxiga . For a 90 day supply and the co-pay is 0.00 . PA is not needed at this time.   Effient too soon until 11/16/24 -just got 09/09/24  Losartan  too soon until 10/02/24 just got 09/09/24  Carvedilol too soon NEXT AVAILABLE FILL DATE [id number], LAST FILL DT [id number]  Atorvastatin  too soon NEXT AVAILABLE FILL DATE [id number], LAST FILL DT [id number]    Lmom for patient in my encounter

## 2024-09-23 NOTE — Telephone Encounter (Signed)
 H&V Care Navigation CSW Progress Note  Clinical Social Worker contacted patient by phone to f/u on concerns related to finances and medication access. No answer again today at [phone]. Left 2nd voicemail , will re-attempt again as able.   Patient is participating in a Managed Medicaid Plan:  No, Laura Camacho and Medicaid  SDOH Screenings   Food Insecurity: Food Insecurity Present (09/07/2024)  Housing: Unknown (09/07/2024)  Transportation Needs: Unmet Transportation Needs (09/07/2024)  Utilities: Not At Risk (09/07/2024)  Tobacco Use: High Risk (09/19/2024)     Laura Camacho, MSW, LCSW Clinical Social Worker II [facility] Heart/Vascular Care Navigation  [phone]- work cell phone (preferred)

## 2024-09-24 ENCOUNTER — Other Ambulatory Visit (HOSPITAL_COMMUNITY): Payer: Self-pay

## 2024-09-24 NOTE — Telephone Encounter (Signed)
 Hi, I spoke to the granddaughter and she said she set her up with the mon-sun pill box and put all the medications that they gave her at the hospital in there (so she is not out of medication like the patient believes). She said she has tried, the mother has tried, the other sister has tried to explain the medication in the pill box is the right medication but the patient does not believe those are the right medications. They are asking if she can be set up for an appt to bring in her pill box and someone explain the medications she is supposed to take so she will take them. Thank you

## 2024-09-24 NOTE — Telephone Encounter (Signed)
 LMOM with granddaughter.  Melissa can see her at [facility] office at 11 am Thursday Nov 20

## 2024-09-24 NOTE — Telephone Encounter (Signed)
 The patient called back this morning. She said she needed help with not the copays but with what she is supposed to take. She said she has only taken medications once since being out of the hospital. She picked up: aspirin , atorvastatin , carvedilol, losartan , nitro and effient at [facility] on 09/09/24 but she said someone threw all those medications away. She said she still has the medications she had before she went into the hospital but she doesn't know which she is still supposed to be on. She did not get the farxiga  for some reason, even though it is free. I've reached out to [facility] to see if they can get a override for the ones she got on 11/3 and to send everything in a pill pak. She is asking for someone to look over her med list and to let her know what to take.

## 2024-09-24 NOTE — Telephone Encounter (Signed)
 Appointment set

## 2024-09-24 NOTE — Telephone Encounter (Signed)
 Lmom for granddaughter to call back

## 2024-09-25 ENCOUNTER — Telehealth (HOSPITAL_BASED_OUTPATIENT_CLINIC_OR_DEPARTMENT_OTHER): Payer: Self-pay | Admitting: Licensed Clinical Social Worker

## 2024-09-25 NOTE — Telephone Encounter (Signed)
 H&V Care Navigation CSW Progress Note  Clinical Social Worker contacted patient by phone to f/u again on medications and home environment. Noted that pharmacy team spoke with granddaughter and pt, they have set an appt with PharmD tomorrow. I sent f/u message to [facility], PharmD that we remain available as needed for any home needs.  Patient is participating in a Managed Medicaid Plan:  no, Laura Camacho and Medicaid   SDOH Screenings   Food Insecurity: Food Insecurity Present (09/07/2024)  Housing: Unknown (09/07/2024)  Transportation Needs: Unmet Transportation Needs (09/07/2024)  Utilities: Not At Risk (09/07/2024)  Tobacco Use: High Risk (09/19/2024)     Marit Lark, MSW, LCSW Clinical Social Worker II [facility] Health Heart/Vascular Care Navigation  [phone]- work cell phone (preferred)

## 2024-09-26 ENCOUNTER — Telehealth: Payer: Self-pay | Admitting: Licensed Clinical Social Worker

## 2024-09-26 ENCOUNTER — Other Ambulatory Visit (HOSPITAL_COMMUNITY): Payer: Self-pay

## 2024-09-26 ENCOUNTER — Ambulatory Visit: Attending: Cardiology | Admitting: Pharmacist

## 2024-09-26 ENCOUNTER — Other Ambulatory Visit: Payer: Self-pay

## 2024-09-26 VITALS — BP 160/88 | HR 53

## 2024-09-26 DIAGNOSIS — I10 Essential (primary) hypertension: Secondary | ICD-10-CM

## 2024-09-26 DIAGNOSIS — I2510 Atherosclerotic heart disease of native coronary artery without angina pectoris: Secondary | ICD-10-CM | POA: Diagnosis not present

## 2024-09-26 DIAGNOSIS — E1142 Type 2 diabetes mellitus with diabetic polyneuropathy: Secondary | ICD-10-CM

## 2024-09-26 MED ORDER — HYDRALAZINE HCL 10 MG PO TABS: 25.0000 mg | ORAL_TABLET | Freq: Once | ORAL | Status: DC
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
## 2024-09-26 NOTE — Assessment & Plan Note (Signed)
 Assessment: Blood pressure extremely high in clinic today Compliance with medications is questionable Per great granddaughter who makes up medboxes but does not live with patient. Patient does not take medications 3-4 times a week Per patient, her great granddaughter who lives with her does not give her her medications but once a day. Great grandaughter doesn't leave her room until late and doesn't come when she calls her Patient states she can't see well and doesn't feel comfortable taking her medications on her own. We reviewed how she could safely take her medications herself extensively. Patient was able to read when objects were held close to her face Reviewed the importance of taking her medications, the importance of ASA and Effient in keeping stents open and importance of controlled BP  Plan: Patient was instructed to take her AM medications when she got home right away Farxiga  was filled by [facility] pharmacy for patient to pick up on her way out Will continue to have great granddaughter fill med boxes until [facility] can pre-pack them Patient was instructed to make sure she is taking her medications twice a day everyday.  I will see if we can get home health to come out to help patient with taking medications SW will mail blood pressure cuff She has cardiac rehab next week, HF follow up the following week and cards f/u the week after

## 2024-09-26 NOTE — Patient Instructions (Addendum)
 Location Address Hours Phone  [facility] [address] [place], [place] [address] M - F: 8:30 AM - 4:30 PM [phone]  [facility] [address] [place] [address] M-F:  8 AM - 11 AM [phone]  [facility] [address] [place], [place] [address] Tu: 10 AM - 1 PM [phone]  [facility]  [address] [place] [address]   [phone]  [facility]  [address] [place] [address]  W: 9 AM - 10 AM [phone]  [facility]  [address] [place] [address]   Sa & Su: 8 [place] [phone]   [facility] [address] [place] [address] KAE CHRISTELLA GUTTING F : 10 AM [phone]  [facility] [address] [place] [address] M & W: 10 AM - 11:30 AM (Bread/Pastries Only); Sa: 8 AM - until (4th Saturday of each month) [phone]  [facility]  [address] [place] [address]  Tu & Th: 1 PM - 3 PM (Appt. Only) [phone]  [facility]  [address] [place] [address]  W & F: 11 AM- 12 PM (food boxes); Sa: 9 AM - 11 AM (fruit & veggies 2nd Saturday of each month) [phone]  [facility] [address] [place] [address] Tu & 2nd & 4th Sa: 8:30-11 AM [phone]   Make sure you take your medications every morning and at night Please see if you can borrow a blood pressure cuff from someone. We will try to get you a free one If you have increased chest pain please go to the ER
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
## 2024-09-30 ENCOUNTER — Telehealth: Payer: Self-pay | Admitting: Licensed Clinical Social Worker

## 2024-09-30 NOTE — Addendum Note (Signed)
 Addended by: Taraji Mungo D on: 09/30/2024 09:39 AM   Modules accepted: Orders

## 2024-09-30 NOTE — Telephone Encounter (Signed)
 H&V Care Navigation CSW Progress Note  Clinical Social Worker contacted patient by phone to f/u after pharmacy appt and to check on if BP Cuff arrived. Pt answered this morning, [phone]. Does not remember that she had an appt with pharmacy team, I went to a room and talked to two people. Does not think her BP cuff arrived, confirmed home address on file. Will check. Shared that her chest feels funny, like the pressure before you have to burp. Advised her to go to ED for assessment if she notices it worsening or changing and advised her I'd route this to RN Triage team.  Given ongoing SDOH challenges, I have reached out to [facility] APS team member to see if pt being followed by their team. Left message for Lonell Benders. Will also leave message for supervisor since that staff member at APS is part time.   Patient is participating in a Managed Medicaid Plan:  No [employer] Medicare  SDOH Screenings   Food Insecurity: Food Insecurity Present (09/07/2024)  Housing: Unknown (09/07/2024)  Transportation Needs: Unmet Transportation Needs (09/07/2024)  Utilities: Not At Risk (09/07/2024)  Tobacco Use: High Risk (09/19/2024)    Marit Lark, MSW, LCSW Clinical Social Worker II [facility] Health Heart/Vascular Care Navigation  [phone]- work cell phone (preferred)

## 2024-09-30 NOTE — Telephone Encounter (Signed)
 H&V Care Navigation CSW Progress Note  Clinical Social Worker was able to speak with [facility] DSS supervisor Glendia Richters, [phone] to connect with currently assigned caseworker Jerilynn, [phone]. Voicemail left for caseworker to connect on how to best support pt.  Patient is participating in a Managed Medicaid Plan:  No, [employer] Medicare  SDOH Screenings   Food Insecurity: Food Insecurity Present (09/07/2024)  Housing: Unknown (09/07/2024)  Transportation Needs: Unmet Transportation Needs (09/07/2024)  Utilities: Not At Risk (09/07/2024)  Tobacco Use: High Risk (09/19/2024)    Marit Lark, MSW, LCSW Clinical Social Worker II [facility] Health Heart/Vascular Care Navigation  [phone]- work cell phone (preferred)

## 2024-10-01 ENCOUNTER — Encounter: Attending: Cardiovascular Disease | Admitting: *Deleted

## 2024-10-01 VITALS — Ht 64.4 in | Wt 184.6 lb

## 2024-10-01 DIAGNOSIS — I252 Old myocardial infarction: Secondary | ICD-10-CM | POA: Diagnosis not present

## 2024-10-01 DIAGNOSIS — I213 ST elevation (STEMI) myocardial infarction of unspecified site: Secondary | ICD-10-CM

## 2024-10-01 DIAGNOSIS — Z48812 Encounter for surgical aftercare following surgery on the circulatory system: Secondary | ICD-10-CM | POA: Diagnosis not present

## 2024-10-01 DIAGNOSIS — Z955 Presence of coronary angioplasty implant and graft: Secondary | ICD-10-CM | POA: Diagnosis not present

## 2024-10-01 NOTE — Progress Notes (Signed)
 Cardiac Individual Treatment Plan  Patient Details  Name: Laura Camacho MRN: [id number] Date of Birth: 1961/07/26 Referring Provider:   Flowsheet Row Cardiac Rehab from 10/01/2024 in [facility] Cardiac and Pulmonary Rehab  Referring Provider Dr. Virgene Cage    Initial Encounter Date:  Flowsheet Row Cardiac Rehab from 10/01/2024 in [facility] Cardiac and Pulmonary Rehab  Date 10/01/24    Visit Diagnosis: ST elevation myocardial infarction (STEMI), unspecified artery [facility])  Status post coronary artery stent placement  Patient's Home Medications on Admission:  Current Outpatient Medications:    acetaminophen  (TYLENOL ) 650 MG CR tablet, Take 650 mg by mouth every 8 (eight) hours as needed for pain. (Patient not taking: Reported on 09/19/2024), Disp: , Rfl:    aspirin  EC 81 MG tablet, Take 1 tablet (81 mg total) by mouth daily., Disp: 90 tablet, Rfl: 0   atorvastatin  (LIPITOR ) 80 MG tablet, Take 1 tablet (80 mg total) by mouth daily., Disp: 90 tablet, Rfl: 0   Blood Glucose Calibration (TRUE METRIX LEVEL 1) Low SOLN, , Disp: , Rfl:    Budeson-Glycopyrrol-Formoterol (BREZTRI  AEROSPHERE) 160-9-4.8 MCG/ACT AERO, Inhale 2 puffs into the lungs in the morning and at bedtime. (Patient not taking: Reported on 09/26/2024), Disp: 10.7 g, Rfl: 5   carvedilol  (COREG ) 3.125 MG tablet, Take 1 tablet (3.125 mg total) by mouth 2 (two) times daily with a meal., Disp: 60 tablet, Rfl: 0   dapagliflozin  propanediol (FARXIGA ) 10 MG TABS tablet, Take 1 tablet (10 mg total) by mouth daily. (Patient not taking: Reported on 09/26/2024), Disp: 90 tablet, Rfl: 3   docusate sodium  (STOOL SOFTENER) 100 MG capsule, Take 1 capsule (100 mg total) by mouth daily. (Patient not taking: Reported on 09/19/2024), Disp: 90 capsule, Rfl: 3   dorzolamide -timolol  (COSOPT ) 22.3-6.8 MG/ML ophthalmic solution, Place 1 drop into both eyes 2 (two) times daily., Disp: , Rfl:    ergocalciferol  (VITAMIN D2) 1.25 MG (50000 UT) capsule, Take 1  capsule (50,000 Units total) by mouth once a week., Disp: 12 capsule, Rfl: 3   fluticasone  (FLONASE ) 50 MCG/ACT nasal spray, PLACE 1 SPRAY IN EACH NOSTRIL ONCE DAILYAS NEEDED FOR ALLERGIES, Disp: 16 g, Rfl: 3   gabapentin  (NEURONTIN ) 300 MG capsule, Take 1-2 capsules (300-600 mg total) by mouth at bedtime as needed (pain-pt takes 1-2 capsules at bedtime PRN)., Disp: 90 capsule, Rfl: 3   glucose blood (PRODIGY NO CODING BLOOD GLUC) test strip, Use as instructed, Disp: 100 each, Rfl: 2   LANTUS  SOLOSTAR 100 UNIT/ML Solostar Pen, INJECT 35 UNITS SUBCUTANEOUSLY AT BEDTIME, Disp: 15 mL, Rfl: 7   losartan  (COZAAR ) 50 MG tablet, Take 1 tablet (50 mg total) by mouth daily., Disp: 30 tablet, Rfl: 0   nitroGLYCERIN  (NITROSTAT ) 0.4 MG SL tablet, Place 1 tablet (0.4 mg total) under the tongue every 5 (five) minutes as needed for chest pain., Disp: 25 tablet, Rfl: 11   prasugrel  (EFFIENT ) 10 MG TABS tablet, Take 1 tablet (10 mg total) by mouth daily., Disp: 90 tablet, Rfl: 0  Current Facility-Administered Medications:    hydrALAZINE  (APRESOLINE ) tablet 25 mg, 25 mg, Oral, Once, Maccia, Melissa D, RPH-CPP  Past Medical History: Past Medical History:  Diagnosis Date   Complication of anesthesia    STATES WAS TOLD NOT TO TAKE UNKNOWN ANESTHESIA AFTER NECKFUSION OR HYSTERECTOMY   Diabetes mellitus without complication (HCC)    Dyslipidemia 05/05/2017   Epilepsy (HCC)    Fibromyalgia    GERD (gastroesophageal reflux disease)    Glaucoma (increased eye pressure)  History of Glaucoma but no drops   Headache    Hyperlipidemia    Hypertension    Neurofibromatosis (HCC)    Pyelonephritis     Tobacco Use: Social History   Tobacco Use  Smoking Status Every Day   Current packs/day: 0.50   Types: Cigarettes  Smokeless Tobacco Never  Tobacco Comments   0.5 ppd 06/09/2021    Labs: Review Flowsheet  More data may exist      Latest Ref Rng & Units 11/16/2018 05/31/2021 01/31/2023 04/25/2024 09/06/2024   Labs for ITP Cardiac and Pulmonary Rehab  Cholestrol 0 - 200 mg/dL - - 845  790  866   LDL (calc) 0 - 99 mg/dL - - 96  857  73   HDL-C >40 mg/dL - - 34  43  34   Trlycerides <150 mg/dL - - 866  867  870   Hemoglobin A1c 4.8 - 5.6 % 8.0  9.1  6.1  7.5  7.5      Exercise Target Goals: Exercise Program Goal: Individual exercise prescription set using results from initial 6 min walk test and THRR while considering  patient's activity barriers and safety.   Exercise Prescription Goal: Initial exercise prescription builds to 30-45 minutes a day of aerobic activity, 2-3 days per week.  Home exercise guidelines will be given to patient during program as part of exercise prescription that the participant will acknowledge.   Education: Aerobic Exercise: - Group verbal and visual presentation on the components of exercise prescription. Introduces F.I.T.T principle from ACSM for exercise prescriptions.  Reviews F.I.T.T. principles of aerobic exercise including progression. Written material provided at class time.   Education: Resistance Exercise: - Group verbal and visual presentation on the components of exercise prescription. Introduces F.I.T.T principle from ACSM for exercise prescriptions  Reviews F.I.T.T. principles of resistance exercise including progression. Written material provided at class time.    Education: Exercise & Equipment Safety: - Individual verbal instruction and demonstration of equipment use and safety with use of the equipment. Flowsheet Row Cardiac Rehab from 10/01/2024 in [facility] Cardiac and Pulmonary Rehab  Date 10/01/24  Educator [facility]  Instruction Review Code 1- Verbalizes Understanding    Education: Exercise Physiology & General Exercise Guidelines: - Group verbal and written instruction with models to review the exercise physiology of the cardiovascular system and associated critical values. Provides general exercise guidelines with specific guidelines to those with  heart or lung disease. Written material provided at class time.   Education: Flexibility, Balance, Mind/Body Relaxation: - Group verbal and visual presentation with interactive activity on the components of exercise prescription. Introduces F.I.T.T principle from ACSM for exercise prescriptions. Reviews F.I.T.T. principles of flexibility and balance exercise training including progression. Also discusses the mind body connection.  Reviews various relaxation techniques to help reduce and manage stress (i.e. Deep breathing, progressive muscle relaxation, and visualization). Balance handout provided to take home. Written material provided at class time.   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 HR 86 bpm     Max Ex. BP 164/76     2 Minute Post BP 138/74        Oxygen Initial Assessment:   Oxygen Re-Evaluation:   Oxygen Discharge (Final Oxygen Re-Evaluation):   Initial Exercise Prescription:  Initial Exercise Prescription - 10/01/24 1500       Date of Initial Exercise RX and Referring Provider   Date 10/01/24    Referring Provider Dr. Virgene Cage      Oxygen   Maintain Oxygen Saturation 88% or higher      NuStep    Level 2    SPM 80    Minutes 15    METs 2.35      Arm Ergometer   Level 1    Watts 25    RPM 25    Minutes 15    METs 2.35      Track   Laps 24    Minutes 15    METs 2.31      Prescription Details   Duration Progress to 30 minutes of continuous aerobic without signs/symptoms of physical distress      Intensity   THRR 40-80% of Max Heartrate 97-136    Ratings of Perceived Exertion 11-13    Perceived Dyspnea 0-4      Progression   Progression Continue to progress workloads to maintain intensity without signs/symptoms of physical distress.      Resistance Training   Training Prescription Yes    Weight 2lb    Reps 10-15          Perform Capillary Blood Glucose checks as needed.  Exercise Prescription Changes:   Exercise Prescription Changes     Row Name 10/01/24 1600             Response to Exercise   Blood Pressure (Admit) 148/72       Blood Pressure (Exercise) 164/76       Blood Pressure (Exit) 138/74       Heart Rate (Admit) 58 bpm       Heart Rate (Exercise) 86 bpm       Heart Rate (Exit) 56 bpm       Oxygen Saturation (Admit) 97 %       Oxygen Saturation (Exercise) 97 %       Oxygen Saturation (Exit) 97 %       Rating of Perceived Exertion (Exercise) 15       Perceived Dyspnea (Exercise) 1       Symptoms SOB       Comments results          Exercise Comments:   Exercise Goals and Review:   Exercise Goals     Row Name 10/01/24 1601             Exercise Goals   Increase Physical  Activity Yes       Intervention Provide advice, education, support and counseling about physical activity/exercise needs.;Develop an individualized exercise prescription for aerobic and resistive training based on initial evaluation findings, risk stratification, comorbidities and participant's personal goals.       Expected Outcomes Short Term: Attend rehab on a regular basis to increase amount of physical activity.;Long Term: Add in home exercise to make  exercise part of routine and to increase amount of physical activity.;Long Term: Exercising regularly at least 3-5 days a week.       Increase Strength and Stamina Yes       Intervention Provide advice, education, support and counseling about physical activity/exercise needs.;Develop an individualized exercise prescription for aerobic and resistive training based on initial evaluation findings, risk stratification, comorbidities and participant's personal goals.       Expected Outcomes Short Term: Increase workloads from initial exercise prescription for resistance, speed, and METs.;Short Term: Perform resistance training exercises routinely during rehab and add in resistance training at home;Long Term: Improve cardiorespiratory fitness, muscular endurance and strength as measured by increased METs and functional capacity ( )       Able to understand and use rate of perceived exertion (RPE) scale Yes       Intervention Provide education and explanation on how to use RPE scale       Expected Outcomes Short Term: Able to use RPE daily in rehab to express subjective intensity level;Long Term:  Able to use RPE to guide intensity level when exercising independently       Able to understand and use Dyspnea scale Yes       Intervention Provide education and explanation on how to use Dyspnea scale       Expected Outcomes Short Term: Able to use Dyspnea scale daily in rehab to express subjective sense of shortness of breath during exertion;Long Term: Able to use Dyspnea scale to guide intensity level when exercising independently       Knowledge and understanding of Target Heart Rate Range (THRR) Yes       Intervention Provide education and explanation of THRR including how the numbers were predicted and where they are located for reference       Expected Outcomes Long Term: Able to use THRR to govern intensity when exercising independently;Short Term: Able to use daily as guideline for intensity in  rehab;Short Term: Able to state/look up THRR       Able to check pulse independently Yes       Intervention Provide education and demonstration on how to check pulse in carotid and radial arteries.;Review the importance of being able to check your own pulse for safety during independent exercise       Expected Outcomes Short Term: Able to explain why pulse checking is important during independent exercise;Long Term: Able to check pulse independently and accurately       Understanding of Exercise Prescription Yes       Intervention Provide education, explanation, and written materials on patient's individual exercise prescription       Expected Outcomes Short Term: Able to explain program exercise prescription;Long Term: Able to explain home exercise prescription to exercise independently          Exercise Goals Re-Evaluation :   Discharge Exercise Prescription (Final Exercise Prescription Changes):  Exercise Prescription Changes - 10/01/24 1600       Response to Exercise   Blood Pressure (Admit) 148/72    Blood Pressure (Exercise) 164/76  Blood Pressure (Exit) 138/74    Heart Rate (Admit) 58 bpm    Heart Rate (Exercise) 86 bpm    Heart Rate (Exit) 56 bpm    Oxygen Saturation (Admit) 97 %    Oxygen Saturation (Exercise) 97 %    Oxygen Saturation (Exit) 97 %    Rating of Perceived Exertion (Exercise) 15    Perceived Dyspnea (Exercise) 1    Symptoms SOB    Comments results          Nutrition:  Target Goals: Understanding of nutrition guidelines, daily intake of sodium 1500mg , cholesterol 200mg , calories 30% from fat and 7% or less from saturated fats, daily to have 5 or more servings of fruits and vegetables.  Education: Nutrition 1 -Group instruction provided by verbal, written material, interactive activities, discussions, models, and posters to present general guidelines for heart healthy nutrition including macronutrients, label reading, and promoting whole foods  over processed counterparts. Education serves as pensions consultant of discussion of heart healthy eating for all. Written material provided at class time.    Education: Nutrition 2 -Group instruction provided by verbal, written material, interactive activities, discussions, models, and posters to present general guidelines for heart healthy nutrition including sodium, cholesterol, and saturated fat. Providing guidance of habit forming to improve blood pressure, cholesterol, and body weight. Written material provided at class time.     Biometrics:  Pre Biometrics - 10/01/24 1601       Pre Biometrics   Height 5' 4.4 (1.636 m)    Weight 184 lb 9.6 oz (83.7 kg)    Waist Circumference 38 inches    Hip Circumference 44 inches    Waist to Hip Ratio 0.86 %    BMI (Calculated) 31.28    Single Leg Stand 4.5 seconds           Nutrition Therapy Plan and Nutrition Goals:   Nutrition Assessments:  MEDIFICTS Score Key: >=70 Need to make dietary changes  40-70 Heart Healthy Diet <= 40 Therapeutic Level Cholesterol Diet   Picture Your Plate Scores: <59 Unhealthy dietary pattern with much room for improvement. 41-50 Dietary pattern unlikely to meet recommendations for good health and room for improvement. 51-60 More healthful dietary pattern, with some room for improvement.  >60 Healthy dietary pattern, although there may be some specific behaviors that could be improved.    Nutrition Goals Re-Evaluation:   Nutrition Goals Discharge (Final Nutrition Goals Re-Evaluation):   Psychosocial: Target Goals: Acknowledge presence or absence of significant depression and/or stress, maximize coping skills, provide positive support system. Participant is able to verbalize types and ability to use techniques and skills needed for reducing stress and depression.   Education: Stress, Anxiety, and Depression - Group verbal and visual presentation to define topics covered.  Reviews how body is  impacted by stress, anxiety, and depression.  Also discusses healthy ways to reduce stress and to treat/manage anxiety and depression. Written material provided at class time.   Education: Sleep Hygiene -Provides group verbal and written instruction about how sleep can affect your health.  Define sleep hygiene, discuss sleep cycles and impact of sleep habits. Review good sleep hygiene tips.   Initial Review & Psychosocial Screening:  Initial Psych Review & Screening - 10/01/24 1551       Initial Review   Current issues with None Identified      Family Dynamics   Good Support System? Yes      Barriers   Psychosocial barriers to participate in program There are  no identifiable barriers or psychosocial needs.      Screening Interventions   Interventions Encouraged to exercise;Provide feedback about the scores to participant;To provide support and resources with identified psychosocial needs    Expected Outcomes Short Term goal: Utilizing psychosocial counselor, staff and physician to assist with identification of specific Stressors or current issues interfering with healing process. Setting desired goal for each stressor or current issue identified.;Long Term Goal: Stressors or current issues are controlled or eliminated.;Short Term goal: Identification and review with participant of any Quality of Life or Depression concerns found by scoring the questionnaire.;Long Term goal: The participant improves quality of Life and PHQ9 Scores as seen by post scores and/or verbalization of changes          Quality of Life Scores:   Scores of 19 and below usually indicate a poorer quality of life in these areas.  A difference of  2-3 points is a clinically meaningful difference.  A difference of 2-3 points in the total score of the Quality of Life Index has been associated with significant improvement in overall quality of life, self-image, physical symptoms, and general health in studies assessing  change in quality of life.  PHQ-9: Review Flowsheet       10/01/2024 01/16/2018  Depression screen PHQ 2/9  Decreased Interest - 0  Down, Depressed, Hopeless 0 0  PHQ - 2 Score 0 0  Altered sleeping 3 -  Tired, decreased energy 3 -  Change in appetite 3 -  Feeling bad or failure about yourself  0 -  Trouble concentrating 1 -  Moving slowly or fidgety/restless 0 -  Suicidal thoughts 0 -  PHQ-9 Score 10 -  Difficult doing work/chores Somewhat difficult -   Interpretation of Total Score  Total Score Depression Severity:  1-4 = Minimal depression, 5-9 = Mild depression, 10-14 = Moderate depression, 15-19 = Moderately severe depression, 20-27 = Severe depression   Psychosocial Evaluation and Intervention:  Psychosocial Evaluation - 10/01/24 1552       Psychosocial Evaluation & Interventions   Interventions Encouraged to exercise with the program and follow exercise prescription    Comments Ms. Minardi is coming to cardiac rehab post MI and stent. She is accompanied by her granddaughter who is one of her family members who help her. They help manage her medications and appointments. When asked about stress, she states not at this specific time. She mentions her main hobby is playing games on her computer. She is interested in attending the program to hopefully boost stamina and strength    Expected Outcomes Short: attend cardiac rehab for education and exercise Long: develop and maintain positive self care habits    Continue Psychosocial Services  Follow up required by staff          Psychosocial Re-Evaluation:   Psychosocial Discharge (Final Psychosocial Re-Evaluation):   Vocational Rehabilitation: Provide vocational rehab assistance to qualifying candidates.   Vocational Rehab Evaluation & Intervention:  Vocational Rehab - 10/01/24 1459       Initial Vocational Rehab Evaluation & Intervention   Assessment shows need for Vocational Rehabilitation No           Education: Education Goals: Education classes will be provided on a variety of topics geared toward better understanding of heart health and risk factor modification. Participant will state understanding/return demonstration of topics presented as noted by education test scores.  Learning Barriers/Preferences:  Learning Barriers/Preferences - 10/01/24 1459       Learning Barriers/Preferences  Learning Barriers Sight   Blind in Right Eye   Learning Preferences Verbal Instruction          General Cardiac Education Topics:  AED/CPR: - Group verbal and written instruction with the use of models to demonstrate the basic use of the AED with the basic ABC's of resuscitation.   Test and Procedures: - Group verbal and visual presentation and models provide information about basic cardiac anatomy and function. Reviews the testing methods done to diagnose heart disease and the outcomes of the test results. Describes the treatment choices: Medical Management, Angioplasty, or Coronary Bypass Surgery for treating various heart conditions including Myocardial Infarction, Angina, Valve Disease, and Cardiac Arrhythmias. Written material provided at class time.   Medication Safety: - Group verbal and visual instruction to review commonly prescribed medications for heart and lung disease. Reviews the medication, class of the drug, and side effects. Includes the steps to properly store meds and maintain the prescription regimen. Written material provided at class time.   Intimacy: - Group verbal instruction through game format to discuss how heart and lung disease can affect sexual intimacy. Written material provided at class time.   Know Your Numbers and Heart Failure: - Group verbal and visual instruction to discuss disease risk factors for cardiac and pulmonary disease and treatment options.  Reviews associated critical values for Overweight/Obesity, Hypertension, Cholesterol, and Diabetes.   Discusses basics of heart failure: signs/symptoms and treatments.  Introduces Heart Failure Zone chart for action plan for heart failure. Written material provided at class time.   Infection Prevention: - Provides verbal and written material to individual with discussion of infection control including proper hand washing and proper equipment cleaning during exercise session. Flowsheet Row Cardiac Rehab from 10/01/2024 in [facility] Cardiac and Pulmonary Rehab  Date 10/01/24  Educator [facility]  Instruction Review Code 1- Verbalizes Understanding    Falls Prevention: - Provides verbal and written material to individual with discussion of falls prevention and safety. Flowsheet Row Cardiac Rehab from 10/01/2024 in [facility] Cardiac and Pulmonary Rehab  Date 10/01/24  Educator [facility]  Instruction Review Code 1- Verbalizes Understanding    Other: -Provides group and verbal instruction on various topics (see comments)   Knowledge Questionnaire Score:   Core Components/Risk Factors/Patient Goals at Admission:  Personal Goals and Risk Factors at Admission - 10/01/24 1459       Core Components/Risk Factors/Patient Goals on Admission   Diabetes Yes    Intervention Provide education about proper nutrition, including hydration, and aerobic/resistive exercise prescription along with prescribed medications to achieve blood glucose in normal ranges: Fasting glucose 65-99 mg/dL;Provide education about signs/symptoms and action to take for hypo/hyperglycemia.    Expected Outcomes Short Term: Participant verbalizes understanding of the signs/symptoms and immediate care of hyper/hypoglycemia, proper foot care and importance of medication, aerobic/resistive exercise and nutrition plan for blood glucose control.;Long Term: Attainment of HbA1C < 7%.    Hypertension Yes    Intervention Provide education on lifestyle modifcations including regular physical activity/exercise, weight management, moderate sodium restriction and  increased consumption of fresh fruit, vegetables, and low fat dairy, alcohol moderation, and smoking cessation.;Monitor prescription use compliance.    Expected Outcomes Long Term: Maintenance of blood pressure at goal levels.;Short Term: Continued assessment and intervention until BP is < 140/71mm HG in hypertensive participants. < 130/94mm HG in hypertensive participants with diabetes, heart failure or chronic kidney disease.    Lipids Yes    Intervention Provide education and support for participant on nutrition & aerobic/resistive exercise  along with prescribed medications to achieve LDL 70mg , HDL >40mg .    Expected Outcomes Short Term: Participant states understanding of desired cholesterol values and is compliant with medications prescribed. Participant is following exercise prescription and nutrition guidelines.;Long Term: Cholesterol controlled with medications as prescribed, with individualized exercise RX and with personalized nutrition plan. Value goals: LDL < 70mg , HDL > 40 mg.          Education:Diabetes - Individual verbal and written instruction to review signs/symptoms of diabetes, desired ranges of glucose level fasting, after meals and with exercise. Acknowledge that pre and post exercise glucose checks will be done for 3 sessions at entry of program. Flowsheet Row Cardiac Rehab from 10/01/2024 in [facility] Cardiac and Pulmonary Rehab  Date 10/01/24  Educator [facility]  Instruction Review Code 1- Verbalizes Understanding    Core Components/Risk Factors/Patient Goals Review:    Core Components/Risk Factors/Patient Goals at Discharge (Final Review):    ITP Comments:  ITP Comments     Row Name 10/01/24 1550           ITP Comments Completed program orientation and . Initial ITP created and sent for review to Medical Director.          Comments: Initial ITP

## 2024-10-01 NOTE — Patient Instructions (Signed)
 Patient Instructions  Patient Details  Name: Laura Camacho  CIIN BRAZZEL MRN: [id number] Date of Birth: 1961-02-10 Referring Provider:  Darron Deatrice DELENA, MD  Below are your personal goals for exercise, nutrition, and risk factors. Our goal is to help you stay on track towards obtaining and maintaining these goals. We will be discussing your progress on these goals with you throughout the program.  Initial Exercise Prescription:  Initial Exercise Prescription - 10/01/24 1500       Date of Initial Exercise RX and Referring Provider   Date 10/01/24    Referring Provider Dr. Virgene Darron      Oxygen   Maintain Oxygen Saturation 88% or higher      NuStep   Level 2    SPM 80    Minutes 15    METs 2.35      Arm Ergometer   Level 1    Watts 25    RPM 25    Minutes 15    METs 2.35      Track   Laps 24    Minutes 15    METs 2.31      Prescription Details   Duration Progress to 30 minutes of continuous aerobic without signs/symptoms of physical distress      Intensity   THRR 40-80% of Max Heartrate 97-136    Ratings of Perceived Exertion 11-13    Perceived Dyspnea 0-4      Progression   Progression Continue to progress workloads to maintain intensity without signs/symptoms of physical distress.      Resistance Training   Training Prescription Yes    Weight 2lb    Reps 10-15          Exercise Goals: Frequency: Be able to perform aerobic exercise two to three times per week in program working toward 2-5 days per week of home exercise.  Intensity: Work with a perceived exertion of 11 (fairly light) - 15 (hard) while following your exercise prescription.  We will make changes to your prescription with you as you progress through the program.   Duration: Be able to do 30 to 45 minutes of continuous aerobic exercise in addition to a 5 minute warm-up and a 5 minute cool-down routine.   Nutrition Goals: Your personal nutrition goals will be established when you do your  nutrition analysis with the dietician.  The following are general nutrition guidelines to follow: Cholesterol < 200mg /day Sodium < 1500mg /day Fiber: Women over 50 yrs - 21 grams per day  Personal Goals:  Personal Goals and Risk Factors at Admission - 10/01/24 1459       Core Components/Risk Factors/Patient Goals on Admission   Diabetes Yes    Intervention Provide education about proper nutrition, including hydration, and aerobic/resistive exercise prescription along with prescribed medications to achieve blood glucose in normal ranges: Fasting glucose 65-99 mg/dL;Provide education about signs/symptoms and action to take for hypo/hyperglycemia.    Expected Outcomes Short Term: Participant verbalizes understanding of the signs/symptoms and immediate care of hyper/hypoglycemia, proper foot care and importance of medication, aerobic/resistive exercise and nutrition plan for blood glucose control.;Long Term: Attainment of HbA1C < 7%.    Hypertension Yes    Intervention Provide education on lifestyle modifcations including regular physical activity/exercise, weight management, moderate sodium restriction and increased consumption of fresh fruit, vegetables, and low fat dairy, alcohol moderation, and smoking cessation.;Monitor prescription use compliance.    Expected Outcomes Long Term: Maintenance of blood pressure at goal levels.;Short Term: Continued assessment and intervention until BP  is < 140/47mm HG in hypertensive participants. < 130/43mm HG in hypertensive participants with diabetes, heart failure or chronic kidney disease.    Lipids Yes    Intervention Provide education and support for participant on nutrition & aerobic/resistive exercise along with prescribed medications to achieve LDL 70mg , HDL >40mg .    Expected Outcomes Short Term: Participant states understanding of desired cholesterol values and is compliant with medications prescribed. Participant is following exercise prescription and  nutrition guidelines.;Long Term: Cholesterol controlled with medications as prescribed, with individualized exercise RX and with personalized nutrition plan. Value goals: LDL < 70mg , HDL > 40 mg.          Tobacco Use Initial Evaluation: Social History   Tobacco Use  Smoking Status Every Day   Current packs/day: 0.50   Types: Cigarettes  Smokeless Tobacco Never  Tobacco Comments   0.5 ppd 06/09/2021    Exercise Goals and Review:  Exercise Goals     Row Name 10/01/24 1601             Exercise Goals   Increase Physical Activity Yes       Intervention Provide advice, education, support and counseling about physical activity/exercise needs.;Develop an individualized exercise prescription for aerobic and resistive training based on initial evaluation findings, risk stratification, comorbidities and participant's personal goals.       Expected Outcomes Short Term: Attend rehab on a regular basis to increase amount of physical activity.;Long Term: Add in home exercise to make exercise part of routine and to increase amount of physical activity.;Long Term: Exercising regularly at least 3-5 days a week.       Increase Strength and Stamina Yes       Intervention Provide advice, education, support and counseling about physical activity/exercise needs.;Develop an individualized exercise prescription for aerobic and resistive training based on initial evaluation findings, risk stratification, comorbidities and participant's personal goals.       Expected Outcomes Short Term: Increase workloads from initial exercise prescription for resistance, speed, and METs.;Short Term: Perform resistance training exercises routinely during rehab and add in resistance training at home;Long Term: Improve cardiorespiratory fitness, muscular endurance and strength as measured by increased METs and functional capacity ( )       Able to understand and use rate of perceived exertion (RPE) scale Yes        Intervention Provide education and explanation on how to use RPE scale       Expected Outcomes Short Term: Able to use RPE daily in rehab to express subjective intensity level;Long Term:  Able to use RPE to guide intensity level when exercising independently       Able to understand and use Dyspnea scale Yes       Intervention Provide education and explanation on how to use Dyspnea scale       Expected Outcomes Short Term: Able to use Dyspnea scale daily in rehab to express subjective sense of shortness of breath during exertion;Long Term: Able to use Dyspnea scale to guide intensity level when exercising independently       Knowledge and understanding of Target Heart Rate Range (THRR) Yes       Intervention Provide education and explanation of THRR including how the numbers were predicted and where they are located for reference       Expected Outcomes Long Term: Able to use THRR to govern intensity when exercising independently;Short Term: Able to use daily as guideline for intensity in rehab;Short Term: Able to state/look up THRR  Able to check pulse independently Yes       Intervention Provide education and demonstration on how to check pulse in carotid and radial arteries.;Review the importance of being able to check your own pulse for safety during independent exercise       Expected Outcomes Short Term: Able to explain why pulse checking is important during independent exercise;Long Term: Able to check pulse independently and accurately       Understanding of Exercise Prescription Yes       Intervention Provide education, explanation, and written materials on patient's individual exercise prescription       Expected Outcomes Short Term: Able to explain program exercise prescription;Long Term: Able to explain home exercise prescription to exercise independently          Copy of goals given to participant.

## 2024-10-07 ENCOUNTER — Encounter

## 2024-10-07 DIAGNOSIS — I213 ST elevation (STEMI) myocardial infarction of unspecified site: Secondary | ICD-10-CM | POA: Insufficient documentation

## 2024-10-07 DIAGNOSIS — Z955 Presence of coronary angioplasty implant and graft: Secondary | ICD-10-CM | POA: Insufficient documentation

## 2024-10-09 ENCOUNTER — Encounter

## 2024-10-09 ENCOUNTER — Telehealth: Payer: Self-pay | Admitting: Licensed Clinical Social Worker

## 2024-10-09 DIAGNOSIS — I213 ST elevation (STEMI) myocardial infarction of unspecified site: Secondary | ICD-10-CM

## 2024-10-09 DIAGNOSIS — Z955 Presence of coronary angioplasty implant and graft: Secondary | ICD-10-CM

## 2024-10-09 LAB — GLUCOSE, CAPILLARY
Glucose-Capillary: 128 mg/dL — ABNORMAL HIGH (ref 70–99)
Glucose-Capillary: 69 mg/dL — ABNORMAL LOW (ref 70–99)
Glucose-Capillary: 74 mg/dL (ref 70–99)

## 2024-10-09 NOTE — Progress Notes (Signed)
 Daily Session Note  Patient Details  Name: Laura  LAYLYNN Camacho MRN: [id number] Date of Birth: 05-Jun-1961 Referring Provider:   Flowsheet Row Cardiac Rehab from 10/01/2024 in [facility] Cardiac and Pulmonary Rehab  Referring Provider Dr. Virgene Cage    Encounter Date: 10/09/2024  Check In:  Session Check In - 10/09/24 1555       Check-In   Supervising physician immediately available to respond to emergencies See telemetry face sheet for immediately available ER MD    Location [facility]-Cardiac & Pulmonary Rehab    Staff Present Burnard Davenport RN,BSN,MPA;Joseph Rolinda RCP,RRT,BSRT;Laura Cates RN,BSN;Lizmary Nader Dyane BS, ACSM CEP, Exercise Physiologist    Virtual Visit No    Medication changes reported     No    Fall or balance concerns reported    No    Warm-up and Cool-down Performed on first and last piece of equipment    Resistance Training Performed Yes    VAD Patient? No    PAD/SET Patient? No      Pain Assessment   Currently in Pain? No/denies             Social History   Tobacco Use  Smoking Status Every Day   Current packs/day: 0.50   Types: Cigarettes  Smokeless Tobacco Never  Tobacco Comments   0.5 ppd 06/09/2021    Goals Met:  Independence with exercise equipment Exercise tolerated well No report of concerns or symptoms today Strength training completed today  Goals Unmet:  Not Applicable  Comments: First full day of exercise!  Patient was oriented to gym and equipment including functions, settings, policies, and procedures.  Patient's individual exercise prescription and treatment plan were reviewed.  All starting workloads were established based on the results of the 6 minute walk test done at initial orientation visit.  The plan for exercise progression was also introduced and progression will be customized based on patient's performance and goals.    Dr. Oneil Pinal is Medical Director for [facility] Cardiac Rehabilitation.  Dr. Fuad Aleskerov is Medical  Director for [facility] Pulmonary Rehabilitation.

## 2024-10-09 NOTE — Telephone Encounter (Signed)
 H&V Care Navigation CSW Progress Note  Clinical Social Worker received a call back from [facility] team member Jerilynn to let me know that they had completed review of concerns with pt and referred pt to medication management team, recommended she use bubble packs for adherence purposes and f/u care has been referred to another caseworker  [place] (spelling not clear) and ahs passed on my info. She shares that pt has capacity to refuse recommendations, they have gone to her house and met with granddaughter and assisted them with removing expired medications and encouraged them to fill and dispense meds as prescribed to ensure health. Remain available as needed.  Patient is participating in a Managed Medicaid Plan:  No [employer] Medicare and Medicaid  SDOH Screenings   Food Insecurity: Food Insecurity Present (09/07/2024)  Housing: Unknown (09/07/2024)  Transportation Needs: Unmet Transportation Needs (09/07/2024)  Utilities: Not At Risk (09/07/2024)  Depression (PHQ2-9): Medium Risk (10/01/2024)  Tobacco Use: High Risk (10/01/2024)    Marit Lark, MSW, LCSW Clinical Social Worker II [facility] Heart/Vascular Care Navigation  [phone]- work cell phone (preferred)

## 2024-10-14 ENCOUNTER — Encounter

## 2024-10-16 ENCOUNTER — Telehealth: Payer: Self-pay | Admitting: Family

## 2024-10-16 ENCOUNTER — Encounter

## 2024-10-16 NOTE — Telephone Encounter (Signed)
 Called to confirm/remind patient of their appointment at the [facility] on 10/17/24.   Appointment:   [x] Confirmed  [] Left mess   [] No answer/No voice mail  [] VM Full/unable to leave message  [] Phone not in service  Patient reminded to bring all medications and/or complete list.  Confirmed patient has transportation. Gave directions, instructed to utilize valet parking.

## 2024-10-17 ENCOUNTER — Ambulatory Visit: Admitting: Family

## 2024-10-17 ENCOUNTER — Telehealth: Payer: Self-pay | Admitting: Family
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
## 2024-10-17 NOTE — Telephone Encounter (Signed)
 Patient did not show for her Heart Failure Clinic appointment on 10/17/24.

## 2024-10-18 ENCOUNTER — Ambulatory Visit: Admitting: Family

## 2024-10-21 ENCOUNTER — Encounter

## 2024-10-21 ENCOUNTER — Ambulatory Visit: Attending: Medical | Admitting: Medical

## 2024-10-21 NOTE — Progress Notes (Deleted)
°  Cardiology Office Note   Date:  10/21/2024  ID:  Laura Camacho, DOB 1961-09-05, MRN [id number] PCP: Carin Gauze, NP  [facility] Providers Cardiologist:  Deatrice Cage, MD { Click to update primary MD,subspecialty MD or APP then REFRESH:1}    History of Present Illness Laura Camacho is a 63 y.o. female with STEMI/CAD s/p PCI distal Lcx, frequent PVCs, HTN, HFpEF, DM2, fibromyalgia, GERD, tobacco use who presents for hospital follow-up.    Patient presented 09/06/24 with chest pains found to have STEMI in the inferior leads.  Patient was taken urgently to cath and underwent PCI/DES placement to the left circumflex.  Patient was started on DAPT with aspirin  and Plavix  for 1 year.  She was started on low-dose beta-blocker and Lipitor .  Echo showed EF 55 to 60%, grade 2 diastolic dysfunction.  The patient was last seen 09/19/24 reporting she was not taking nay of her medications.   ROS: ***  Studies Reviewed      *** Risk Assessment/Calculations {Does this patient have ATRIAL FIBRILLATION?[phone]} No BP recorded.  {Refresh Note OR Click here to enter BP  :1}***       Physical Exam VS:  There were no vitals taken for this visit.       Wt Readings from Last 3 Encounters:  10/01/24 184 lb 9.6 oz (83.7 kg)  09/19/24 182 lb 4 oz (82.7 kg)  09/19/24 181 lb 6.4 oz (82.3 kg)    GEN: Well nourished, well developed in no acute distress NECK: No JVD; No carotid bruits CARDIAC: ***RRR, no murmurs, rubs, gallops RESPIRATORY:  Clear to auscultation without rales, wheezing or rhonchi  ABDOMEN: Soft, non-tender, non-distended EXTREMITIES:  No edema; No deformity   ASSESSMENT AND PLAN ***    {Are you ordering a CV Procedure (e.g. stress test, cath, DCCV, TEE, etc)?   Press F2        :[id number]}  Dispo: ***  Signed, Winfrey Chillemi VEAR Fishman, PA-C

## 2024-10-23 ENCOUNTER — Encounter: Payer: Self-pay | Admitting: *Deleted

## 2024-10-23 ENCOUNTER — Encounter

## 2024-10-23 DIAGNOSIS — Z955 Presence of coronary angioplasty implant and graft: Secondary | ICD-10-CM

## 2024-10-23 DIAGNOSIS — I213 ST elevation (STEMI) myocardial infarction of unspecified site: Secondary | ICD-10-CM

## 2024-10-23 NOTE — Progress Notes (Signed)
 Cardiac Individual Treatment Plan  Patient Details  Name: Laura Camacho MRN: [id number] Date of Birth: Jun 26, 1961 Referring Provider:   Flowsheet Row Cardiac Rehab from 10/01/2024 in [facility] Cardiac and Pulmonary Rehab  Referring Provider Dr. Virgene Cage    Initial Encounter Date:  Flowsheet Row Cardiac Rehab from 10/01/2024 in [facility] Cardiac and Pulmonary Rehab  Date 10/01/24    Visit Diagnosis: ST elevation myocardial infarction (STEMI), unspecified artery (HCC)  Status post coronary artery stent placement  Patient's Home Medications on Admission: Current Medications[1]  Past Medical History: Past Medical History:  Diagnosis Date   Complication of anesthesia    STATES WAS TOLD NOT TO TAKE UNKNOWN ANESTHESIA AFTER NECKFUSION OR HYSTERECTOMY   Diabetes mellitus without complication (HCC)    Dyslipidemia 05/05/2017   Epilepsy (HCC)    Fibromyalgia    GERD (gastroesophageal reflux disease)    Glaucoma (increased eye pressure)    History of Glaucoma but no drops   Headache    Hyperlipidemia    Hypertension    Neurofibromatosis (HCC)    Pyelonephritis     Tobacco Use: Tobacco Use History[2]  Labs: Review Flowsheet  More data may exist      Latest Ref Rng & Units 11/16/2018 05/31/2021 01/31/2023 04/25/2024 09/06/2024  Labs for ITP Cardiac and Pulmonary Rehab  Cholestrol 0 - 200 mg/dL - - 845  790  866   LDL (calc) 0 - 99 mg/dL - - 96  857  73   HDL-C >40 mg/dL - - 34  43  34   Trlycerides <150 mg/dL - - 866  867  870   Hemoglobin A1c 4.8 - 5.6 % 8.0  9.1  6.1  7.5  7.5      Exercise Target Goals: Exercise Program Goal: Individual exercise prescription set using results from initial 6 min walk test and THRR while considering  patients activity barriers and safety.   Exercise Prescription Goal: Initial exercise prescription builds to 30-45 minutes a day of aerobic activity, 2-3 days per week.  Home exercise guidelines will be given to patient during program as  part of exercise prescription that the participant will acknowledge.   Education: Aerobic Exercise: - Group verbal and visual presentation on the components of exercise prescription. Introduces F.I.T.T principle from ACSM for exercise prescriptions.  Reviews F.I.T.T. principles of aerobic exercise including progression. Written material provided at class time.   Education: Resistance Exercise: - Group verbal and visual presentation on the components of exercise prescription. Introduces F.I.T.T principle from ACSM for exercise prescriptions  Reviews F.I.T.T. principles of resistance exercise including progression. Written material provided at class time.    Education: Exercise & Equipment Safety: - Individual verbal instruction and demonstration of equipment use and safety with use of the equipment. Flowsheet Row Cardiac Rehab from 10/09/2024 in [facility] Cardiac and Pulmonary Rehab  Date 10/01/24  Educator [facility]  Instruction Review Code 1- Verbalizes Understanding    Education: Exercise Physiology & General Exercise Guidelines: - Group verbal and written instruction with models to review the exercise physiology of the cardiovascular system and associated critical values. Provides general exercise guidelines with specific guidelines to those with heart or lung disease. Written material provided at class time.   Education: Flexibility, Balance, Mind/Body Relaxation: - Group verbal and visual presentation with interactive activity on the components of exercise prescription. Introduces F.I.T.T principle from ACSM for exercise prescriptions. Reviews F.I.T.T. principles of flexibility and balance exercise training including progression. Also discusses the mind body connection.  Reviews various relaxation  techniques to help reduce and manage stress (i.e. Deep breathing, progressive muscle relaxation, and visualization). Balance handout provided to take home. Written material provided at class  time.   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 HR 86 bpm     Max Ex. BP 164/76     2 Minute Post BP 138/74        Oxygen Initial Assessment:   Oxygen Re-Evaluation:   Oxygen Discharge (Final Oxygen Re-Evaluation):   Initial Exercise Prescription:  Initial Exercise Prescription - 10/01/24 1500       Date of Initial Exercise RX and Referring Provider   Date 10/01/24    Referring Provider Dr. Virgene Cage      Oxygen   Maintain Oxygen Saturation 88% or higher      NuStep   Level 2    SPM 80    Minutes 15    METs 2.35      Arm Ergometer   Level 1    Watts 25    RPM 25    Minutes 15    METs 2.35      Track   Laps 24    Minutes 15    METs 2.31      Prescription Details   Duration Progress to 30 minutes of continuous aerobic without signs/symptoms of physical distress      Intensity   THRR 40-80% of Max Heartrate 97-136    Ratings of Perceived Exertion 11-13    Perceived Dyspnea 0-4      Progression   Progression Continue to progress workloads to maintain intensity without signs/symptoms of physical distress.      Resistance Training   Training Prescription Yes    Weight 2lb    Reps 10-15           Perform Capillary Blood Glucose checks as needed.  Exercise Prescription Changes:   Exercise Prescription Changes     Row Name 10/01/24 1600 10/16/24 1600           Response to Exercise   Blood Pressure (Admit) 148/72 110/62      Blood Pressure (Exercise) 164/76 140/80      Blood Pressure (Exit) 138/74 124/68      Heart Rate (Admit) 58 bpm 62 bpm      Heart Rate (Exercise) 86  bpm 108 bpm      Heart Rate (Exit) 56 bpm 80 bpm      Oxygen Saturation (Admit) 97 % --      Oxygen Saturation (Exercise) 97 % --      Oxygen Saturation (Exit) 97 % --      Rating of Perceived Exertion (Exercise) 15 17      Perceived Dyspnea (Exercise) 1 0      Symptoms SOB none      Comments results first 2 weeks of exercise      Duration -- Progress to 30 minutes of  aerobic without signs/symptoms of physical distress      Intensity -- THRR unchanged        Progression   Progression -- Continue to progress workloads to maintain intensity without signs/symptoms of physical distress.      Average METs -- 2.8        Resistance Training   Training Prescription -- Yes      Weight -- 2lb      Reps -- 10-15        Interval Training   Interval Training -- No        NuStep   Level -- 2      Minutes -- 15      METs -- 2.7        Arm Ergometer   Level -- 2      Watts -- 25      RPM -- 25      Minutes -- 15      METs -- 2.95        Oxygen   Maintain Oxygen Saturation -- 88% or higher         Exercise Comments:   Exercise Comments     Row Name 10/09/24 1602           Exercise Comments First full day of exercise!  Patient was oriented to gym and equipment including functions, settings, policies, and procedures.  Patient's individual exercise prescription and treatment plan were reviewed.  All starting workloads were established based on the results of the 6 minute walk test done at initial orientation visit.  The plan for exercise progression was also introduced and progression  will be customized based on patient's performance and goals.          Exercise Goals and Review:   Exercise Goals     Row Name 10/01/24 1601             Exercise Goals   Increase Physical Activity Yes       Intervention Provide advice, education, support and counseling about physical activity/exercise needs.;Develop an individualized exercise prescription for aerobic and resistive training based on initial evaluation findings, risk stratification, comorbidities and participant's personal goals.       Expected Outcomes Short Term: Attend rehab on a regular basis to increase amount of physical activity.;Long Term: Add in home exercise to make exercise part of routine and to increase amount of physical activity.;Long Term: Exercising regularly at least 3-5 days a week.       Increase Strength and Stamina Yes       Intervention Provide advice, education, support and counseling about physical activity/exercise needs.;Develop an individualized exercise prescription for aerobic and resistive training based on initial evaluation findings, risk stratification, comorbidities and participant's personal goals.       Expected Outcomes Short Term: Increase workloads from initial exercise prescription for resistance, speed, and METs.;Short Term: Perform resistance  training exercises routinely during rehab and add in resistance training at home;Long Term: Improve cardiorespiratory fitness, muscular endurance and strength as measured by increased METs and functional capacity ( )       Able to understand and use rate of perceived exertion (RPE) scale Yes       Intervention Provide education and explanation on how to use RPE scale       Expected Outcomes Short Term: Able to use RPE daily in rehab to express subjective intensity level;Long Term:  Able to use RPE to guide intensity level when exercising independently       Able to understand and use Dyspnea scale Yes       Intervention Provide education and  explanation on how to use Dyspnea scale       Expected Outcomes Short Term: Able to use Dyspnea scale daily in rehab to express subjective sense of shortness of breath during exertion;Long Term: Able to use Dyspnea scale to guide intensity level when exercising independently       Knowledge and understanding of Target Heart Rate Range (THRR) Yes       Intervention Provide education and explanation of THRR including how the numbers were predicted and where they are located for reference       Expected Outcomes Long Term: Able to use THRR to govern intensity when exercising independently;Short Term: Able to use daily as guideline for intensity in rehab;Short Term: Able to state/look up THRR       Able to check pulse independently Yes       Intervention Provide education and demonstration on how to check pulse in carotid and radial arteries.;Review the importance of being able to check your own pulse for safety during independent exercise       Expected Outcomes Short Term: Able to explain why pulse checking is important during independent exercise;Long Term: Able to check pulse independently and accurately       Understanding of Exercise Prescription Yes       Intervention Provide education, explanation, and written materials on patient's individual exercise prescription       Expected Outcomes Short Term: Able to explain program exercise prescription;Long Term: Able to explain home exercise prescription to exercise independently          Exercise Goals Re-Evaluation :  Exercise Goals Re-Evaluation     Row Name 10/09/24 1602 10/16/24 1604           Exercise Goal Re-Evaluation   Exercise Goals Review Increase Physical Activity;Able to understand and use rate of perceived exertion (RPE) scale;Knowledge and understanding of Target Heart Rate Range (THRR);Understanding of Exercise Prescription;Able to understand and use Dyspnea scale;Able to check pulse independently;Increase Strength and Stamina  Increase Physical Activity;Increase Strength and Stamina;Understanding of Exercise Prescription      Comments Reviewed RPE and dyspnea scale, THR and program prescription with pt today.  Pt voiced understanding and was given a copy of goals to take home. Laura Camacho  is off to a good start in the program, and was able to attend her first session during this review period. During her session she was able to use the T4 nustep and recumbent bike at level 2. We will continue to moniotr her progress in the program.      Expected Outcomes Short: Use RPE daily to regulate intensity. Long: Follow program prescription in THR. Short: Continue to follow exercise prescription. Long: Continue exercise to improve strength and stamina.         Discharge Exercise  Prescription (Final Exercise Prescription Changes):  Exercise Prescription Changes - 10/16/24 1600       Response to Exercise   Blood Pressure (Admit) 110/62    Blood Pressure (Exercise) 140/80    Blood Pressure (Exit) 124/68    Heart Rate (Admit) 62 bpm    Heart Rate (Exercise) 108 bpm    Heart Rate (Exit) 80 bpm    Rating of Perceived Exertion (Exercise) 17    Perceived Dyspnea (Exercise) 0    Symptoms none    Comments first 2 weeks of exercise    Duration Progress to 30 minutes of  aerobic without signs/symptoms of physical distress    Intensity THRR unchanged      Progression   Progression Continue to progress workloads to maintain intensity without signs/symptoms of physical distress.    Average METs 2.8      Resistance Training   Training Prescription Yes    Weight 2lb    Reps 10-15      Interval Training   Interval Training No      NuStep   Level 2    Minutes 15    METs 2.7      Arm Ergometer   Level 2    Watts 25    RPM 25    Minutes 15    METs 2.95      Oxygen   Maintain Oxygen Saturation 88% or higher          Nutrition:  Target Goals: Understanding of nutrition guidelines, daily intake of sodium 1500mg ,  cholesterol 200mg , calories 30% from fat and 7% or less from saturated fats, daily to have 5 or more servings of fruits and vegetables.  Education: Nutrition 1 -Group instruction provided by verbal, written material, interactive activities, discussions, models, and posters to present general guidelines for heart healthy nutrition including macronutrients, label reading, and promoting whole foods over processed counterparts. Education serves as pensions consultant of discussion of heart healthy eating for all. Written material provided at class time.    Education: Nutrition 2 -Group instruction provided by verbal, written material, interactive activities, discussions, models, and posters to present general guidelines for heart healthy nutrition including sodium, cholesterol, and saturated fat. Providing guidance of habit forming to improve blood pressure, cholesterol, and body weight. Written material provided at class time.     Biometrics:  Pre Biometrics - 10/01/24 1601       Pre Biometrics   Height 5' 4.4 (1.636 m)    Weight 184 lb 9.6 oz (83.7 kg)    Waist Circumference 38 inches    Hip Circumference 44 inches    Waist to Hip Ratio 0.86 %    BMI (Calculated) 31.28    Single Leg Stand 4.5 seconds           Nutrition Therapy Plan and Nutrition Goals:   Nutrition Assessments:  MEDIFICTS Score Key: >=70 Need to make dietary changes  40-70 Heart Healthy Diet <= 40 Therapeutic Level Cholesterol Diet   Picture Your Plate Scores: <59 Unhealthy dietary pattern with much room for improvement. 41-50 Dietary pattern unlikely to meet recommendations for good health and room for improvement. 51-60 More healthful dietary pattern, with some room for improvement.  >60 Healthy dietary pattern, although there may be some specific behaviors that could be improved.    Nutrition Goals Re-Evaluation:   Nutrition Goals Discharge (Final Nutrition Goals  Re-Evaluation):   Psychosocial: Target Goals: Acknowledge presence or absence of significant depression and/or stress, maximize coping skills, provide positive  support system. Participant is able to verbalize types and ability to use techniques and skills needed for reducing stress and depression.   Education: Stress, Anxiety, and Depression - Group verbal and visual presentation to define topics covered.  Reviews how body is impacted by stress, anxiety, and depression.  Also discusses healthy ways to reduce stress and to treat/manage anxiety and depression. Written material provided at class time. Flowsheet Row Cardiac Rehab from 10/09/2024 in [facility] Cardiac and Pulmonary Rehab  Date 10/09/24  Educator [facility]  Instruction Review Code 1- Verbalizes Understanding    Education: Sleep Hygiene -Provides group verbal and written instruction about how sleep can affect your health.  Define sleep hygiene, discuss sleep cycles and impact of sleep habits. Review good sleep hygiene tips.   Initial Review & Psychosocial Screening:  Initial Psych Review & Screening - 10/01/24 1551       Initial Review   Current issues with None Identified      Family Dynamics   Good Support System? Yes      Barriers   Psychosocial barriers to participate in program There are no identifiable barriers or psychosocial needs.      Screening Interventions   Interventions Encouraged to exercise;Provide feedback about the scores to participant;To provide support and resources with identified psychosocial needs    Expected Outcomes Short Term goal: Utilizing psychosocial counselor, staff and physician to assist with identification of specific Stressors or current issues interfering with healing process. Setting desired goal for each stressor or current issue identified.;Long Term Goal: Stressors or current issues are controlled or eliminated.;Short Term goal: Identification and review with participant of any Quality of Life or  Depression concerns found by scoring the questionnaire.;Long Term goal: The participant improves quality of Life and PHQ9 Scores as seen by post scores and/or verbalization of changes          Quality of Life Scores:   Scores of 19 and below usually indicate a poorer quality of life in these areas.  A difference of  2-3 points is a clinically meaningful difference.  A difference of 2-3 points in the total score of the Quality of Life Index has been associated with significant improvement in overall quality of life, self-image, physical symptoms, and general health in studies assessing change in quality of life.  PHQ-9: Review Flowsheet       10/01/2024 01/16/2018  Depression screen PHQ 2/9  Decreased Interest - 0  Down, Depressed, Hopeless 0 0  PHQ - 2 Score 0 0  Altered sleeping 3 -  Tired, decreased energy 3 -  Change in appetite 3 -  Feeling bad or failure about yourself  0 -  Trouble concentrating 1 -  Moving slowly or fidgety/restless 0 -  Suicidal thoughts 0 -  PHQ-9 Score 10 -  Difficult doing work/chores Somewhat difficult -   Interpretation of Total Score  Total Score Depression Severity:  1-4 = Minimal depression, 5-9 = Mild depression, 10-14 = Moderate depression, 15-19 = Moderately severe depression, 20-27 = Severe depression   Psychosocial Evaluation and Intervention:  Psychosocial Evaluation - 10/01/24 1552       Psychosocial Evaluation & Interventions   Interventions Encouraged to exercise with the program and follow exercise prescription    Comments Ms. Jaquess is coming to cardiac rehab post MI and stent. She is accompanied by her granddaughter who is one of her family members who help her. They help manage her medications and appointments. When asked about stress, she states not at  this specific time. She mentions her main hobby is playing games on her computer. She is interested in attending the program to hopefully boost stamina and strength    Expected  Outcomes Short: attend cardiac rehab for education and exercise Long: develop and maintain positive self care habits    Continue Psychosocial Services  Follow up required by staff          Psychosocial Re-Evaluation:   Psychosocial Discharge (Final Psychosocial Re-Evaluation):   Vocational Rehabilitation: Provide vocational rehab assistance to qualifying candidates.   Vocational Rehab Evaluation & Intervention:  Vocational Rehab - 10/01/24 1459       Initial Vocational Rehab Evaluation & Intervention   Assessment shows need for Vocational Rehabilitation No          Education: Education Goals: Education classes will be provided on a variety of topics geared toward better understanding of heart health and risk factor modification. Participant will state understanding/return demonstration of topics presented as noted by education test scores.  Learning Barriers/Preferences:  Learning Barriers/Preferences - 10/01/24 1459       Learning Barriers/Preferences   Learning Barriers Sight   Blind in Right Eye   Learning Preferences Verbal Instruction          General Cardiac Education Topics:  AED/CPR: - Group verbal and written instruction with the use of models to demonstrate the basic use of the AED with the basic ABC's of resuscitation.   Test and Procedures: - Group verbal and visual presentation and models provide information about basic cardiac anatomy and function. Reviews the testing methods done to diagnose heart disease and the outcomes of the test results. Describes the treatment choices: Medical Management, Angioplasty, or Coronary Bypass Surgery for treating various heart conditions including Myocardial Infarction, Angina, Valve Disease, and Cardiac Arrhythmias. Written material provided at class time.   Medication Safety: - Group verbal and visual instruction to review commonly prescribed medications for heart and lung disease. Reviews the medication, class of  the drug, and side effects. Includes the steps to properly store meds and maintain the prescription regimen. Written material provided at class time.   Intimacy: - Group verbal instruction through game format to discuss how heart and lung disease can affect sexual intimacy. Written material provided at class time.   Know Your Numbers and Heart Failure: - Group verbal and visual instruction to discuss disease risk factors for cardiac and pulmonary disease and treatment options.  Reviews associated critical values for Overweight/Obesity, Hypertension, Cholesterol, and Diabetes.  Discusses basics of heart failure: signs/symptoms and treatments.  Introduces Heart Failure Zone chart for action plan for heart failure. Written material provided at class time.   Infection Prevention: - Provides verbal and written material to individual with discussion of infection control including proper hand washing and proper equipment cleaning during exercise session. Flowsheet Row Cardiac Rehab from 10/09/2024 in [facility] Cardiac and Pulmonary Rehab  Date 10/01/24  Educator [facility]  Instruction Review Code 1- Verbalizes Understanding    Falls Prevention: - Provides verbal and written material to individual with discussion of falls prevention and safety. Flowsheet Row Cardiac Rehab from 10/09/2024 in [facility] Cardiac and Pulmonary Rehab  Date 10/01/24  Educator [facility]  Instruction Review Code 1- Verbalizes Understanding    Other: -Provides group and verbal instruction on various topics (see comments)   Knowledge Questionnaire Score:   Core Components/Risk Factors/Patient Goals at Admission:  Personal Goals and Risk Factors at Admission - 10/01/24 1459       Core Components/Risk Factors/Patient Goals  on Admission   Diabetes Yes    Intervention Provide education about proper nutrition, including hydration, and aerobic/resistive exercise prescription along with prescribed medications to achieve blood glucose in normal  ranges: Fasting glucose 65-99 mg/dL;Provide education about signs/symptoms and action to take for hypo/hyperglycemia.    Expected Outcomes Short Term: Participant verbalizes understanding of the signs/symptoms and immediate care of hyper/hypoglycemia, proper foot care and importance of medication, aerobic/resistive exercise and nutrition plan for blood glucose control.;Long Term: Attainment of HbA1C < 7%.    Hypertension Yes    Intervention Provide education on lifestyle modifcations including regular physical activity/exercise, weight management, moderate sodium restriction and increased consumption of fresh fruit, vegetables, and low fat dairy, alcohol moderation, and smoking cessation.;Monitor prescription use compliance.    Expected Outcomes Long Term: Maintenance of blood pressure at goal levels.;Short Term: Continued assessment and intervention until BP is < 140/34mm HG in hypertensive participants. < 130/30mm HG in hypertensive participants with diabetes, heart failure or chronic kidney disease.    Lipids Yes    Intervention Provide education and support for participant on nutrition & aerobic/resistive exercise along with prescribed medications to achieve LDL 70mg , HDL >40mg .    Expected Outcomes Short Term: Participant states understanding of desired cholesterol values and is compliant with medications prescribed. Participant is following exercise prescription and nutrition guidelines.;Long Term: Cholesterol controlled with medications as prescribed, with individualized exercise RX and with personalized nutrition plan. Value goals: LDL < 70mg , HDL > 40 mg.          Education:Diabetes - Individual verbal and written instruction to review signs/symptoms of diabetes, desired ranges of glucose level fasting, after meals and with exercise. Acknowledge that pre and post exercise glucose checks will be done for 3 sessions at entry of program. Flowsheet Row Cardiac Rehab from 10/09/2024 in [facility]  Cardiac and Pulmonary Rehab  Date 10/01/24  Educator [facility]  Instruction Review Code 1- Verbalizes Understanding    Core Components/Risk Factors/Patient Goals Review:    Core Components/Risk Factors/Patient Goals at Discharge (Final Review):    ITP Comments:  ITP Comments     Row Name 10/01/24 1550 10/09/24 1602 10/23/24 0935       ITP Comments Completed program orientation and . Initial ITP created and sent for review to Medical Director. First full day of exercise!  Patient was oriented to gym and equipment including functions, settings, policies, and procedures.  Patient's individual exercise prescription and treatment plan were reviewed.  All starting workloads were established based on the results of the 6 minute walk test done at initial orientation visit.  The plan for exercise progression was also introduced and progression will be customized based on patient's performance and goals. 30 Day review completed. Medical Director ITP review done, changes made as directed, and signed approval by Medical Director. New to program        Comments: 30 Day Review     [1]  Current Outpatient Medications:    acetaminophen  (TYLENOL ) 650 MG CR tablet, Take 650 mg by mouth every 8 (eight) hours as needed for pain. (Patient not taking: Reported on 09/19/2024), Disp: , Rfl:    aspirin  EC 81 MG tablet, Take 1 tablet (81 mg total) by mouth daily., Disp: 90 tablet, Rfl: 0   atorvastatin  (LIPITOR ) 80 MG tablet, Take 1 tablet (80 mg total) by mouth daily., Disp: 90 tablet, Rfl: 0   Blood Glucose Calibration (TRUE METRIX LEVEL 1) Low SOLN, , Disp: , Rfl:    Budeson-Glycopyrrol-Formoterol (BREZTRI  AEROSPHERE) 160-9-4.8 MCG/ACT  AERO, Inhale 2 puffs into the lungs in the morning and at bedtime. (Patient not taking: Reported on 09/26/2024), Disp: 10.7 g, Rfl: 5   carvedilol  (COREG ) 3.125 MG tablet, Take 1 tablet (3.125 mg total) by mouth 2 (two) times daily with a meal., Disp: 60 tablet, Rfl: 0    dapagliflozin  propanediol (FARXIGA ) 10 MG TABS tablet, Take 1 tablet (10 mg total) by mouth daily. (Patient not taking: Reported on 09/26/2024), Disp: 90 tablet, Rfl: 3   docusate sodium  (STOOL SOFTENER) 100 MG capsule, Take 1 capsule (100 mg total) by mouth daily. (Patient not taking: Reported on 09/19/2024), Disp: 90 capsule, Rfl: 3   dorzolamide -timolol  (COSOPT ) 22.3-6.8 MG/ML ophthalmic solution, Place 1 drop into both eyes 2 (two) times daily., Disp: , Rfl:    ergocalciferol  (VITAMIN D2) 1.25 MG (50000 UT) capsule, Take 1 capsule (50,000 Units total) by mouth once a week., Disp: 12 capsule, Rfl: 3   fluticasone  (FLONASE ) 50 MCG/ACT nasal spray, PLACE 1 SPRAY IN EACH NOSTRIL ONCE DAILYAS NEEDED FOR ALLERGIES, Disp: 16 g, Rfl: 3   gabapentin  (NEURONTIN ) 300 MG capsule, Take 1-2 capsules (300-600 mg total) by mouth at bedtime as needed (pain-pt takes 1-2 capsules at bedtime PRN)., Disp: 90 capsule, Rfl: 3   glucose blood (PRODIGY NO CODING BLOOD GLUC) test strip, Use as instructed, Disp: 100 each, Rfl: 2   LANTUS  SOLOSTAR 100 UNIT/ML Solostar Pen, INJECT 35 UNITS SUBCUTANEOUSLY AT BEDTIME, Disp: 15 mL, Rfl: 7   losartan  (COZAAR ) 50 MG tablet, Take 1 tablet (50 mg total) by mouth daily., Disp: 30 tablet, Rfl: 0   nitroGLYCERIN  (NITROSTAT ) 0.4 MG SL tablet, Place 1 tablet (0.4 mg total) under the tongue every 5 (five) minutes as needed for chest pain., Disp: 25 tablet, Rfl: 11   prasugrel  (EFFIENT ) 10 MG TABS tablet, Take 1 tablet (10 mg total) by mouth daily., Disp: 90 tablet, Rfl: 0  Current Facility-Administered Medications:    hydrALAZINE  (APRESOLINE ) tablet 25 mg, 25 mg, Oral, Once, Maccia, Melissa D, RPH-CPP [2]  Social History Tobacco Use  Smoking Status Every Day   Current packs/day: 0.50   Types: Cigarettes  Smokeless Tobacco Never  Tobacco Comments   0.5 ppd 06/09/2021

## 2024-10-28 ENCOUNTER — Encounter

## 2024-10-30 ENCOUNTER — Encounter

## 2024-11-04 ENCOUNTER — Encounter

## 2024-11-04 ENCOUNTER — Telehealth: Payer: Self-pay

## 2024-11-04 NOTE — Telephone Encounter (Signed)
 Attempted outreach re: cardiac rehab attendance. No answer, left voice mail message with callback number requesting call back.

## 2024-11-05 ENCOUNTER — Ambulatory Visit: Admitting: Cardiology

## 2024-11-05 ENCOUNTER — Encounter: Payer: Self-pay | Admitting: Cardiology

## 2024-11-05 VITALS — BP 129/88 | HR 66 | Ht 60.0 in | Wt 184.0 lb

## 2024-11-05 DIAGNOSIS — I152 Hypertension secondary to endocrine disorders: Secondary | ICD-10-CM | POA: Diagnosis not present

## 2024-11-05 DIAGNOSIS — Z794 Long term (current) use of insulin: Secondary | ICD-10-CM

## 2024-11-05 DIAGNOSIS — E66812 Obesity, class 2: Secondary | ICD-10-CM | POA: Diagnosis not present

## 2024-11-05 DIAGNOSIS — Z6835 Body mass index (BMI) 35.0-35.9, adult: Secondary | ICD-10-CM

## 2024-11-05 DIAGNOSIS — Z1231 Encounter for screening mammogram for malignant neoplasm of breast: Secondary | ICD-10-CM

## 2024-11-05 DIAGNOSIS — E1169 Type 2 diabetes mellitus with other specified complication: Secondary | ICD-10-CM

## 2024-11-05 DIAGNOSIS — E1165 Type 2 diabetes mellitus with hyperglycemia: Secondary | ICD-10-CM | POA: Diagnosis not present

## 2024-11-05 DIAGNOSIS — E119 Type 2 diabetes mellitus without complications: Secondary | ICD-10-CM

## 2024-11-05 DIAGNOSIS — E782 Mixed hyperlipidemia: Secondary | ICD-10-CM | POA: Diagnosis not present

## 2024-11-05 DIAGNOSIS — Z1329 Encounter for screening for other suspected endocrine disorder: Secondary | ICD-10-CM

## 2024-11-05 DIAGNOSIS — E1159 Type 2 diabetes mellitus with other circulatory complications: Secondary | ICD-10-CM | POA: Diagnosis not present

## 2024-11-05 MED ORDER — LOSARTAN POTASSIUM 50 MG PO TABS: 50.0000 mg | ORAL_TABLET | Freq: Every day | ORAL | 0 refills | Status: AC

## 2024-11-05 MED ORDER — DAPAGLIFLOZIN PROPANEDIOL 10 MG PO TABS: 10.0000 mg | ORAL_TABLET | Freq: Every day | ORAL | 0 refills | Status: AC

## 2024-11-05 MED ORDER — LANTUS SOLOSTAR 100 UNIT/ML ~~LOC~~ SOPN: 35.0000 [IU] | PEN_INJECTOR | Freq: Every day | SUBCUTANEOUS | 7 refills | Status: AC

## 2024-11-05 MED ORDER — ATORVASTATIN CALCIUM 80 MG PO TABS: 80.0000 mg | ORAL_TABLET | Freq: Every day | ORAL | 0 refills | Status: AC

## 2024-11-05 MED ORDER — CARVEDILOL 3.125 MG PO TABS: 3.1250 mg | ORAL_TABLET | Freq: Two times a day (BID) | ORAL | 0 refills | Status: AC

## 2024-11-05 NOTE — Progress Notes (Unsigned)
 "  Established Patient Office Visit  Subjective:  Patient ID: Laura Camacho  A Locatelli, female    DOB: 1961/03/28  Age: 63 y.o. MRN: [id number]  Chief Complaint  Patient presents with   Follow-up    Routine follow up. Headaches off and on. Issues with eating with insulin .     Patient in office for regular follow up. Patient recently in the hospital for an acute MI. She missed her appointment with cardiology and heart failure. Patient to call to reschedule.  Due for fasting lab work, will return when fasting.  Due for mammogram, order placed.  Medications refilled.  Continue same medications.     No other concerns at this time.   Past Medical History:  Diagnosis Date   Complication of anesthesia    STATES WAS TOLD NOT TO TAKE UNKNOWN ANESTHESIA AFTER NECKFUSION OR HYSTERECTOMY   Diabetes mellitus without complication (HCC)    Dyslipidemia 05/05/2017   Epilepsy (HCC)    Fibromyalgia    GERD (gastroesophageal reflux disease)    Glaucoma (increased eye pressure)    History of Glaucoma but no drops   Headache    Hyperlipidemia    Hypertension    Neurofibromatosis (HCC)    Pyelonephritis     Past Surgical History:  Procedure Laterality Date   ABDOMINAL HYSTERECTOMY     has both ovaries- [facility]   Bilateral Arm Surgery Bilateral    'tumors' removed   BREAST BIOPSY Left 11/05/2018   us  core venus clip  BIOPSY: GRANULAR CELL TUMOR, 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   BREAST BIOPSY Left 11/05/2018   us  core axilla hydro marker  NEGATIVE FOR METASTATIC CARCINOMA   CERVICAL FUSION     COLON SURGERY     COLONOSCOPY N/A 07/27/2020   Procedure: COLONOSCOPY;  Surgeon: Maryruth Ole DASEN, MD;  Location: [facility] ENDOSCOPY;  Service: Endoscopy;  Laterality: N/A;   COLONOSCOPY WITH PROPOFOL  N/A 02/12/2016   Procedure: COLONOSCOPY WITH PROPOFOL ;  Surgeon: Donnice Vaughn Manes, MD;  Location: [facility] ENDOSCOPY;  Service: Endoscopy;  Laterality: N/A;   COLONOSCOPY WITH PROPOFOL  N/A 06/30/2020   Procedure:  COLONOSCOPY WITH PROPOFOL ;  Surgeon: Maryruth Ole DASEN, MD;  Location: [facility] ENDOSCOPY;  Service: Endoscopy;  Laterality: N/A;   CORONARY/GRAFT ACUTE MI REVASCULARIZATION N/A 09/06/2024   Procedure: Coronary/Graft Acute MI Revascularization;  Surgeon: Darron Deatrice LABOR, MD;  Location: [facility] INVASIVE CV LAB;  Service: Cardiovascular;  Laterality: N/A;   ESOPHAGOGASTRODUODENOSCOPY (EGD) WITH PROPOFOL  N/A 02/12/2016   Procedure: ESOPHAGOGASTRODUODENOSCOPY (EGD) WITH PROPOFOL ;  Surgeon: Donnice Vaughn Manes, MD;  Location: [facility] ENDOSCOPY;  Service: Endoscopy;  Laterality: N/A;   ESOPHAGOGASTRODUODENOSCOPY (EGD) WITH PROPOFOL  N/A 06/30/2020   Procedure: ESOPHAGOGASTRODUODENOSCOPY (EGD) WITH PROPOFOL ;  Surgeon: Maryruth Ole DASEN, MD;  Location: [facility] ENDOSCOPY;  Service: Endoscopy;  Laterality: N/A;   LEFT HEART CATH AND CORONARY ANGIOGRAPHY N/A 09/06/2024   Procedure: LEFT HEART CATH AND CORONARY ANGIOGRAPHY;  Surgeon: Darron Deatrice LABOR, MD;  Location: [facility] INVASIVE CV LAB;  Service: Cardiovascular;  Laterality: N/A;   VULVECTOMY N/A 06/26/2017   Procedure: WIDE EXCISION VULVECTOMY;  Surgeon: Kathe Gladis LABOR, MD;  Location: [facility] ORS;  Service: Gynecology;  Laterality: N/A;    Social History   Socioeconomic History   Marital status: Single    Spouse name: Not on file   Number of children: Not on file   Years of education: Not on file   Highest education level: Not on file  Occupational History   Not on file  Tobacco Use   Smoking status: Every  Day    Current packs/day: 0.50    Types: Cigarettes   Smokeless tobacco: Never   Tobacco comments:    0.5 ppd 06/09/2021  Vaping Use   Vaping status: Never Used  Substance and Sexual Activity   Alcohol use: Yes    Alcohol/week: 3.0 - 4.0 standard drinks of alcohol    Types: 3 - 4 Shots of liquor per week    Comment: once a week   Drug use: Not Currently    Types: Marijuana    Comment: monthly   Sexual activity: Yes    Birth  control/protection: Surgical  Other Topics Concern   Not on file  Social History Narrative   Not on file   Social Drivers of Health   Tobacco Use: High Risk (11/05/2024)   Patient History    Smoking Tobacco Use: Every Day    Smokeless Tobacco Use: Never    Passive Exposure: Not on file  Financial Resource Strain: Not on file  Food Insecurity: Food Insecurity Present (09/07/2024)   Epic    Worried About Programme Researcher, Broadcasting/film/video in the Last Year: Often true    Barista in the Last Year: Often true  Transportation Needs: Unmet Transportation Needs (09/07/2024)   Epic    Lack of Transportation (Medical): Yes    Lack of Transportation (Non-Medical): Yes  Physical Activity: Not on file  Stress: Not on file  Social Connections: Not on file  Intimate Partner Violence: Not At Risk (09/07/2024)   Epic    Fear of Current or Ex-Partner: No    Emotionally Abused: No    Physically Abused: No    Sexually Abused: No  Depression (PHQ2-9): Medium Risk (10/01/2024)   Depression (PHQ2-9)    PHQ-2 Score: 10  Alcohol Screen: Not on file  Housing: Unknown (09/07/2024)   Epic    Unable to Pay for Housing in the Last Year: No    Number of Times Moved in the Last Year: Not on file    Homeless in the Last Year: No  Utilities: Not At Risk (09/07/2024)   Epic    Threatened with loss of utilities: No  Health Literacy: Not on file    Family History  Problem Relation Age of Onset   Diabetes Mother    Diabetes Father    Diabetes Maternal Aunt    Diabetes Maternal Uncle    Breast cancer Cousin    Ovarian cancer Neg Hx    Colon cancer Neg Hx     Allergies[1]  Show/hide medication list[2]  Review of Systems  Constitutional: Negative.   HENT: Negative.    Eyes: Negative.   Respiratory: Negative.  Negative for shortness of breath.   Cardiovascular: Negative.  Negative for chest pain.  Gastrointestinal: Negative.  Negative for abdominal pain, constipation and diarrhea.  Genitourinary:  Negative.   Musculoskeletal:  Negative for joint pain and myalgias.  Skin: Negative.   Neurological: Negative.  Negative for dizziness and headaches.  Endo/Heme/Allergies: Negative.   All other systems reviewed and are negative.      Objective:   BP 129/88   Pulse 66   Ht 5' (1.524 m)   Wt 184 lb (83.5 kg)   SpO2 95%   BMI 35.94 kg/m   Vitals:   11/05/24 1345  BP: 129/88  Pulse: 66  Height: 5' (1.524 m)  Weight: 184 lb (83.5 kg)  SpO2: 95%  BMI (Calculated): 35.94    Physical Exam Vitals and nursing note reviewed.  Constitutional:      Appearance: Normal appearance. She is normal weight.  HENT:     Head: Normocephalic and atraumatic.     Nose: Nose normal.     Mouth/Throat:     Mouth: Mucous membranes are moist.  Eyes:     Extraocular Movements: Extraocular movements intact.     Conjunctiva/sclera: Conjunctivae normal.     Pupils: Pupils are equal, round, and reactive to light.  Cardiovascular:     Rate and Rhythm: Normal rate and regular rhythm.     Pulses: Normal pulses.     Heart sounds: Normal heart sounds.  Pulmonary:     Effort: Pulmonary effort is normal.     Breath sounds: Normal breath sounds.  Abdominal:     General: Abdomen is flat. Bowel sounds are normal.     Palpations: Abdomen is soft.  Musculoskeletal:        General: Normal range of motion.     Cervical back: Normal range of motion.  Skin:    General: Skin is warm and dry.  Neurological:     General: No focal deficit present.     Mental Status: She is alert and oriented to person, place, and time.  Psychiatric:        Mood and Affect: Mood normal.        Behavior: Behavior normal.        Thought Content: Thought content normal.        Judgment: Judgment normal.      No results found for any visits on 11/05/24.  Recent Results (from the past 2160 hours)  Hemoglobin A1c     Status: Abnormal   Collection Time: 09/06/24  5:46 PM  Result Value Ref Range   Hgb A1c MFr Bld 7.5 (H)  4.8 - 5.6 %    Comment: (NOTE) Diagnosis of Diabetes The following HbA1c ranges recommended by the American Diabetes Association (ADA) may be used as an aid in the diagnosis of diabetes mellitus.  Hemoglobin             Suggested A1C NGSP%              Diagnosis  <5.7                   Non Diabetic  5.7-6.4                Pre-Diabetic  >6.4                   Diabetic  <7.0                   Glycemic control for                       adults with diabetes.     Mean Plasma Glucose 168.55 mg/dL    Comment: Performed at [facility], [address], [place], [place] [address]  CBC with Differential/Platelet     Status: Abnormal   Collection Time: 09/06/24  5:46 PM  Result Value Ref Range   WBC 10.6 (H) 4.0 - 10.5 K/uL   RBC 4.87 3.87 - 5.11 MIL/uL   Hemoglobin 13.2 12.0 - 15.0 g/dL   HCT 60.0 63.9 - 53.9 %   MCV 81.9 80.0 - 100.0 fL   MCH 27.1 26.0 - 34.0 pg   MCHC 33.1 30.0 - 36.0 g/dL   RDW 85.1 88.4 - 84.4 %   Platelets 411 (  H) 150 - 400 K/uL   nRBC 0.0 0.0 - 0.2 %   Neutrophils Relative % 37 %   Neutro Abs 3.9 1.7 - 7.7 K/uL   Lymphocytes Relative 55 %   Lymphs Abs 5.8 (H) 0.7 - 4.0 K/uL   Monocytes Relative 5 %   Monocytes Absolute 0.6 0.1 - 1.0 K/uL   Eosinophils Relative 2 %   Eosinophils Absolute 0.2 0.0 - 0.5 K/uL   Basophils Relative 1 %   Basophils Absolute 0.1 0.0 - 0.1 K/uL   Immature Granulocytes 0 %   Abs Immature Granulocytes 0.03 0.00 - 0.07 K/uL    Comment: Performed at [facility], [address] [place], [place] [address]  Protime-INR     Status: None   Collection Time: 09/06/24  5:46 PM  Result Value Ref Range   Prothrombin Time 13.3 11.4 - 15.2 seconds   INR 1.0 0.8 - 1.2    Comment: (NOTE) INR goal varies based on device and disease states. Performed at [facility], [address] [place], [place] [address]   APTT     Status: None   Collection Time: 09/06/24  5:46 PM  Result Value Ref Range   aPTT 28 24 - 36  seconds    Comment: Performed at [facility], [address] [place], [place] [address]  Comprehensive metabolic panel     Status: Abnormal   Collection Time: 09/06/24  5:46 PM  Result Value Ref Range   Sodium 139 135 - 145 mmol/L   Potassium 2.8 (L) 3.5 - 5.1 mmol/L   Chloride 102 98 - 111 mmol/L   CO2 27 22 - 32 mmol/L   Glucose, Bld 200 (H) 70 - 99 mg/dL    Comment: Glucose reference range applies only to samples taken after fasting for at least 8 hours.   BUN 7 (L) 8 - 23 mg/dL   Creatinine, Ser 8.79 (H) 0.44 - 1.00 mg/dL   Calcium  8.7 (L) 8.9 - 10.3 mg/dL   Total Protein 7.1 6.5 - 8.1 g/dL   Albumin 3.3 (L) 3.5 - 5.0 g/dL   AST 19 15 - 41 U/L   ALT 12 0 - 44 U/L   Alkaline Phosphatase 71 38 - 126 U/L   Total Bilirubin 0.5 0.0 - 1.2 mg/dL   GFR, Estimated 51 (L) >60 mL/min    Comment: (NOTE) Calculated using the CKD-EPI Creatinine Equation (2021)    Anion gap 10 5 - 15    Comment: Performed at [facility], [address], [place], [place] [address]  Troponin I (High Sensitivity)     Status: Abnormal   Collection Time: 09/06/24  5:46 PM  Result Value Ref Range   Troponin I (High Sensitivity) 36 (H) <18 ng/L    Comment: (NOTE) Elevated high sensitivity troponin I (hsTnI) values and significant  changes across serial measurements may suggest ACS but many other  chronic and acute conditions are known to elevate hsTnI results.  Refer to the Links section for chest pain algorithms and additional  guidance. Performed at [facility], [address] [place], [place] [address]   Lipid panel     Status: Abnormal   Collection Time: 09/06/24  5:46 PM  Result Value Ref Range   Cholesterol 133 0 - 200 mg/dL   Triglycerides 870 <849 mg/dL   HDL 34 (L) >59 mg/dL   Total CHOL/HDL Ratio 3.9 RATIO   VLDL 26 0 - 40 mg/dL   LDL  Cholesterol 73 0 - 99 mg/dL    Comment:        Total Cholesterol/HDL:CHD Risk Coronary Heart Disease Risk Table                      Men   Women  1/2 Average Risk   3.4   3.3  Average Risk       5.0   4.4  2 X Average Risk   9.6   7.1  3 X Average Risk  23.4   11.0        Use the calculated Patient Ratio above and the CHD Risk Table to determine the patient's CHD Risk.        ATP III CLASSIFICATION (LDL):  <100     mg/dL   Optimal  899-870  mg/dL   Near or Above                    Optimal  130-159  mg/dL   Borderline  839-810  mg/dL   High  >809     mg/dL   Very High Performed at [facility], [address] [place], [place] [address]   CG4 I-STAT (Lactic acid)     Status: None   Collection Time: 09/06/24  6:50 PM  Result Value Ref Range   Lactic Acid, Venous 0.9 0.5 - 1.9 mmol/L  POCT Activated clotting time     Status: None   Collection Time: 09/06/24  6:53 PM  Result Value Ref Range   Activated Clotting Time 314 seconds    Comment: Reference range 74-137 seconds for patients not on anticoagulant therapy.  Glucose, capillary     Status: Abnormal   Collection Time: 09/06/24  7:29 PM  Result Value Ref Range   Glucose-Capillary 220 (H) 70 - 99 mg/dL    Comment: Glucose reference range applies only to samples taken after fasting for at least 8 hours.  MRSA Next Gen by PCR, Nasal     Status: None   Collection Time: 09/06/24  7:51 PM   Specimen: Nasal Mucosa; Nasal Swab  Result Value Ref Range   MRSA by PCR Next Gen NOT DETECTED NOT DETECTED    Comment: (NOTE) The GeneXpert MRSA Assay (FDA approved for NASAL specimens only), is one component of a comprehensive MRSA colonization surveillance program. It is not intended to diagnose MRSA infection nor to guide or monitor treatment for MRSA infections. Test performance is not FDA approved in patients less than 74 years old. Performed at [facility], [address] [place], [place] [address]   Glucose, capillary     Status: Abnormal   Collection Time: 09/06/24  8:27 PM  Result Value Ref Range   Glucose-Capillary 243 (H) 70  - 99 mg/dL    Comment: Glucose reference range applies only to samples taken after fasting for at least 8 hours.  [facility] [employer] ([facility]X) Clopidogrel  Test     Status: None   Collection Time: 09/06/24  8:41 PM  Result Value Ref Range   Disclaimer See Notes     Comment: The interpretations and drug considerations provided by [employer] are intended solely for use by a medical professional and do not constitute medical advice by [facility]. All treatment decisions and diagnoses remain the full responsibility of the treating  provider. Results included in this report are based on the guidelines published by the FDA and CPIC, and do not account for other factors that may impact drug response,  such as environment, medical conditions, drug-drug interactions, or additional  genetic variants. [facility] is not responsible or liable for any errors, omissions, or ambiguities in the interpretation or use of the results of this report. Administration of any medication listed in this report requires careful therapeutic monitoring  regardless of the drug considerations outlined in this report. All dates and times displayed are Pacific Time and may vary from the dates and times for Collection, Order and Report for the providers/patients.Note to Patient: This report is intended  for  use by a medical professional. Please discuss any adjustments to your medication with your treating provider.    Interpretation Methods and Limitations See Notes     Comment: Data were generated from extracted DNA using the validated [facility] Exome+ assay by the [facility] clinical laboratory. The Exome+ assay is based on target enrichment followed by next generation sequencing using paired end reads on an Illumina DNA sequencing  system. Star alleles were determined using a proprietary algorithm which performs variant calling and then determines star allele solutions based on a combination of defining SNPs and exon-level copy number.  Metabolizer status  was determined based on  star allele solutions according to [facility] guidelines, with the following exceptions: (1) metabolizer status was set as Indeterminate if a novel nonsense or truncating novel mutation was observed within the gene, (2) metabolizer status was set as  Indeterminate if the combination of defining SNPs and copy number suggested a novel star allele solution, and (3) if more than two copies of a gene were detected then metabolizer status was set as Indeterminate. Drug/gene considerations were limit ed to  guidelines published by FDA, CPIC, or [employer].  Phasing could not be performed for genotypes, and therefore in some cases the star allele solution could not be disambiguated between two or more equally likely possibilities. In these cases, if the  metabolizer status was the same regardless of possible star allele solutions, the more common star allele solution was provided along with the metabolizer status. If the metabolizer status was different for the equally-likely star allele solutions, the  star alleles were reported as Unknown and the metabolizer status was considered Indeterminate.  All samples were sequenced and interpreted in [facility]'s CLIA-certified (#[id number]) and CAP-accredited (#[id number]) laboratory in [facility], [place] . These  tests have not been cleared or approved by the U.S. Food and Drug Administration.  The reportable range includes the following star alleles: CYP2C19: *1-*19, *22-*26, *28-*39.  Results are based on: Clopidogrel , CYP2C19 (CPIC A, FDA  Section 1, PGKB  1A).Vinie Alm Holm, PhD, HCLD, CQ, [email or web address]   Lactic acid, plasma     Status: Abnormal   Collection Time: 09/06/24  8:45 PM  Result Value Ref Range   Lactic Acid, Venous 3.0 (HH) 0.5 - 1.9 mmol/L    Comment: CRITICAL RESULT CALLED TO, READ BACK BY AND VERIFIED WITH CATHERINE WILSON @ 09/06/2024 2138 AB Performed at [facility], [address] [place], [place]  [address]   Troponin I (High Sensitivity)     Status: Abnormal   Collection Time: 09/06/24  8:45 PM  Result Value Ref Range   Troponin I (High Sensitivity) >24,000 (HH) <18 ng/L    Comment: CRITICAL RESULT CALLED TO, READ BACK BY AND VERIFIED WITH CATHERINE WILSON @ 09/06/2024 2151 AB (NOTE) Elevated high sensitivity troponin I (hsTnI) values and significant  changes across serial measurements may suggest ACS but many other  chronic and acute conditions are known to elevate hsTnI results.  Refer to the  Links section for chest pain algorithms and additional  guidance. Performed at [facility], [address] [place], [place] [address]   CBC     Status: None   Collection Time: 09/06/24  8:45 PM  Result Value Ref Range   WBC 9.1 4.0 - 10.5 K/uL   RBC 4.92 3.87 - 5.11 MIL/uL   Hemoglobin 13.4 12.0 - 15.0 g/dL   HCT 60.4 63.9 - 53.9 %   MCV 80.3 80.0 - 100.0 fL   MCH 27.2 26.0 - 34.0 pg   MCHC 33.9 30.0 - 36.0 g/dL   RDW 85.2 88.4 - 84.4 %   Platelets 379 150 - 400 K/uL   nRBC 0.0 0.0 - 0.2 %    Comment: Performed at [facility], [address] [place], [place] [address]  Creatinine, serum     Status: Abnormal   Collection Time: 09/06/24  8:45 PM  Result Value Ref Range   Creatinine, Ser 1.15 (H) 0.44 - 1.00 mg/dL   GFR, Estimated 53 (L) >60 mL/min    Comment: (NOTE) Calculated using the CKD-EPI Creatinine Equation (2021) Performed at [facility], [address] [place], [place] [address]   Glucose, capillary     Status: Abnormal   Collection Time: 09/06/24 11:19 PM  Result Value Ref Range   Glucose-Capillary 266 (H) 70 - 99 mg/dL    Comment: Glucose reference range applies only to samples taken after fasting for at least 8 hours.  Lactic acid, plasma     Status: Abnormal   Collection Time: 09/07/24  2:27 AM  Result Value Ref Range   Lactic Acid, Venous 2.3 (HH) 0.5 - 1.9 mmol/L    Comment: CRITICAL VALUE NOTED. VALUE IS CONSISTENT WITH  PREVIOUSLY REPORTED/CALLED VALUE. JAG Performed at [facility], [address] [place], [place] [address]   Basic metabolic panel     Status: Abnormal   Collection Time: 09/07/24  4:40 AM  Result Value Ref Range   Sodium 137 135 - 145 mmol/L   Potassium 3.6 3.5 - 5.1 mmol/L   Chloride 105 98 - 111 mmol/L   CO2 23 22 - 32 mmol/L   Glucose, Bld 241 (H) 70 - 99 mg/dL    Comment: Glucose reference range applies only to samples taken after fasting for at least 8 hours.   BUN 10 8 - 23 mg/dL   Creatinine, Ser 8.89 (H) 0.44 - 1.00 mg/dL   Calcium  8.8 (L) 8.9 - 10.3 mg/dL   GFR, Estimated 56 (L) >60 mL/min    Comment: (NOTE) Calculated using the CKD-EPI Creatinine Equation (2021)    Anion gap 9 5 - 15    Comment: Performed at [facility], [address] [place], [place] [address]  CBC     Status: Abnormal   Collection Time: 09/07/24  4:40 AM  Result Value Ref Range   WBC 11.0 (H) 4.0 - 10.5 K/uL   RBC 5.24 (H) 3.87 - 5.11 MIL/uL   Hemoglobin 14.4 12.0 - 15.0 g/dL   HCT 58.3 63.9 - 53.9 %   MCV 79.4 (L) 80.0 - 100.0 fL   MCH 27.5 26.0 - 34.0 pg   MCHC 34.6 30.0 - 36.0 g/dL   RDW 85.0 88.4 - 84.4 %   Platelets 398 150 - 400 K/uL   nRBC 0.0 0.0 - 0.2 %    Comment: Performed at [facility], [address], [place], [place] [address]  Lipoprotein A (LPA)  Status: Abnormal   Collection Time: 09/07/24  4:40 AM  Result Value Ref Range   Lipoprotein (a) 224.8 (H) <75.0 nmol/L    Comment: (NOTE) This test was developed and its performance characteristics determined by [facility]. It has not been cleared or approved by the Food and Drug Administration. Note:  Values greater than or equal to 75.0 nmol/L may       indicate an independent risk factor for CHD,       but must be evaluated with caution when applied       to non-Caucasian populations due to the       influence of genetic factors on Lp(a) across       ethnicities. Performed At: [facility] [address] [place], [place] [id number] Jennette Shorter MD Ey:[id number]   Lactic acid, plasma     Status: Abnormal   Collection Time: 09/07/24  4:40 AM  Result Value Ref Range   Lactic Acid, Venous 2.5 (HH) 0.5 - 1.9 mmol/L    Comment: CRITICAL VALUE NOTED. VALUE IS CONSISTENT WITH PREVIOUSLY REPORTED/CALLED VALUE ab Performed at [facility], [address] [place], [place] [address]   Magnesium      Status: None   Collection Time: 09/07/24  4:40 AM  Result Value Ref Range   Magnesium  1.9 1.7 - 2.4 mg/dL    Comment: Performed at [facility], [address] [place], [place] [address]  Glucose, capillary     Status: Abnormal   Collection Time: 09/07/24  7:24 AM  Result Value Ref Range   Glucose-Capillary 194 (H) 70 - 99 mg/dL    Comment: Glucose reference range applies only to samples taken after fasting for at least 8 hours.  ECHOCARDIOGRAM COMPLETE     Status: None   Collection Time: 09/07/24  8:36 AM  Result Value Ref Range   Weight 3,082.91 oz   Height 61 in   BP 157/68 mmHg   Ao pk vel 1.32 m/s   AR max vel 2.26 cm2   AV Peak grad 7.0 mmHg   S' Lateral 3.00 cm   Area-P 1/2 2.74 cm2   Est EF 55 - 60%   Glucose, capillary     Status: Abnormal   Collection Time: 09/07/24 11:27 AM  Result Value Ref Range   Glucose-Capillary 186 (H) 70 - 99 mg/dL    Comment: Glucose reference range applies only to samples taken after fasting for at least 8 hours.  Glucose, capillary     Status: Abnormal   Collection Time: 09/07/24  5:22 PM  Result Value Ref Range   Glucose-Capillary 151 (H) 70 - 99 mg/dL    Comment: Glucose reference range applies only to samples taken after fasting for at least 8 hours.  Glucose, capillary     Status: Abnormal   Collection Time: 09/07/24  9:14 PM  Result Value Ref Range   Glucose-Capillary 183 (H) 70 - 99 mg/dL    Comment: Glucose reference range applies only to samples taken after fasting for at least 8 hours.  Basic  metabolic panel with GFR     Status: Abnormal   Collection Time: 09/08/24  6:09 AM  Result Value Ref Range   Sodium 143 135 - 145 mmol/L   Potassium 3.1 (L) 3.5 - 5.1 mmol/L   Chloride 109 98 - 111 mmol/L   CO2 23 22 - 32 mmol/L   Glucose, Bld 132 (H) 70 - 99 mg/dL    Comment: Glucose reference range applies  only to samples taken after fasting for at least 8 hours.   BUN 15 8 - 23 mg/dL   Creatinine, Ser 8.62 (H) 0.44 - 1.00 mg/dL   Calcium  9.1 8.9 - 10.3 mg/dL   GFR, Estimated 43 (L) >60 mL/min    Comment: (NOTE) Calculated using the CKD-EPI Creatinine Equation (2021)    Anion gap 13 5 - 15    Comment: Performed at [facility], [address] [place], [place] [address]  Glucose, capillary     Status: Abnormal   Collection Time: 09/08/24  8:09 AM  Result Value Ref Range   Glucose-Capillary 137 (H) 70 - 99 mg/dL    Comment: Glucose reference range applies only to samples taken after fasting for at least 8 hours.  Glucose, capillary     Status: Abnormal   Collection Time: 09/08/24 11:52 AM  Result Value Ref Range   Glucose-Capillary 132 (H) 70 - 99 mg/dL    Comment: Glucose reference range applies only to samples taken after fasting for at least 8 hours.  Glucose, capillary     Status: Abnormal   Collection Time: 09/08/24  3:57 PM  Result Value Ref Range   Glucose-Capillary 186 (H) 70 - 99 mg/dL    Comment: Glucose reference range applies only to samples taken after fasting for at least 8 hours.  Glucose, capillary     Status: Abnormal   Collection Time: 09/08/24  8:26 PM  Result Value Ref Range   Glucose-Capillary 199 (H) 70 - 99 mg/dL    Comment: Glucose reference range applies only to samples taken after fasting for at least 8 hours.  Basic metabolic panel with GFR     Status: Abnormal   Collection Time: 09/09/24  4:46 AM  Result Value Ref Range   Sodium 140 135 - 145 mmol/L   Potassium 3.7 3.5 - 5.1 mmol/L   Chloride 109 98 - 111 mmol/L   CO2 22 22 - 32  mmol/L   Glucose, Bld 181 (H) 70 - 99 mg/dL    Comment: Glucose reference range applies only to samples taken after fasting for at least 8 hours.   BUN 14 8 - 23 mg/dL   Creatinine, Ser 8.49 (H) 0.44 - 1.00 mg/dL   Calcium  8.7 (L) 8.9 - 10.3 mg/dL   GFR, Estimated 39 (L) >60 mL/min    Comment: (NOTE) Calculated using the CKD-EPI Creatinine Equation (2021)    Anion gap 9 5 - 15    Comment: Performed at [facility], [address] [place], [place] [address]  Magnesium      Status: None   Collection Time: 09/09/24  4:46 AM  Result Value Ref Range   Magnesium  1.9 1.7 - 2.4 mg/dL    Comment: Performed at [facility], [address] [place], [place] [address]  Glucose, capillary     Status: Abnormal   Collection Time: 09/09/24  7:42 AM  Result Value Ref Range   Glucose-Capillary 150 (H) 70 - 99 mg/dL    Comment: Glucose reference range applies only to samples taken after fasting for at least 8 hours.  Basic metabolic panel with GFR     Status: Abnormal   Collection Time: 09/19/24 10:41 AM  Result Value Ref Range   Glucose 83 70 - 99 mg/dL   BUN 9 8 - 27 mg/dL   Creatinine, Ser 8.77 (H) 0.57 - 1.00 mg/dL   eGFR 50 (L) >40 fO/fpw/8.26   BUN/Creatinine Ratio 7 (L) 12 -  28   Sodium 144 134 - 144 mmol/L   Potassium 4.2 3.5 - 5.2 mmol/L   Chloride 106 96 - 106 mmol/L   CO2 25 20 - 29 mmol/L   Calcium  9.9 8.7 - 10.3 mg/dL  Glucose, capillary     Status: Abnormal   Collection Time: 10/09/24  3:31 PM  Result Value Ref Range   Glucose-Capillary 128 (H) 70 - 99 mg/dL    Comment: Glucose reference range applies only to samples taken after fasting for at least 8 hours.  Glucose, capillary     Status: None   Collection Time: 10/09/24  4:33 PM  Result Value Ref Range   Glucose-Capillary 74 70 - 99 mg/dL    Comment: Glucose reference range applies only to samples taken after fasting for at least 8 hours.  Glucose, capillary     Status: Abnormal   Collection Time:  10/09/24  4:46 PM  Result Value Ref Range   Glucose-Capillary 69 (L) 70 - 99 mg/dL    Comment: Glucose reference range applies only to samples taken after fasting for at least 8 hours.      Assessment & Plan:  Reschedule appointments with cardiology Return for fasting lab work Schedule mammogram Continue current medications  Problem List Items Addressed This Visit       Cardiovascular and Mediastinum   Hypertension associated with diabetes (HCC)   Relevant Medications   insulin  glargine (LANTUS  SOLOSTAR) 100 UNIT/ML Solostar Pen   losartan  (COZAAR ) 50 MG tablet   carvedilol  (COREG ) 3.125 MG tablet   dapagliflozin  propanediol (FARXIGA ) 10 MG TABS tablet   atorvastatin  (LIPITOR ) 80 MG tablet   Other Relevant Orders   CMP14+EGFR     Endocrine   Combined hyperlipidemia associated with type 2 diabetes mellitus (HCC) - Primary   Relevant Medications   insulin  glargine (LANTUS  SOLOSTAR) 100 UNIT/ML Solostar Pen   losartan  (COZAAR ) 50 MG tablet   carvedilol  (COREG ) 3.125 MG tablet   dapagliflozin  propanediol (FARXIGA ) 10 MG TABS tablet   atorvastatin  (LIPITOR ) 80 MG tablet   Other Relevant Orders   Lipid Profile   Type 2 diabetes mellitus with hyperglycemia, without long-term current use of insulin  (HCC)   Relevant Medications   insulin  glargine (LANTUS  SOLOSTAR) 100 UNIT/ML Solostar Pen   losartan  (COZAAR ) 50 MG tablet   dapagliflozin  propanediol (FARXIGA ) 10 MG TABS tablet   atorvastatin  (LIPITOR ) 80 MG tablet   Other Relevant Orders   Hemoglobin A1c     Other   Obesity   Relevant Medications   insulin  glargine (LANTUS  SOLOSTAR) 100 UNIT/ML Solostar Pen   dapagliflozin  propanediol (FARXIGA ) 10 MG TABS tablet   Other Visit Diagnoses       Thyroid  disorder screening       Relevant Orders   TSH     Breast cancer screening by mammogram       Relevant Orders   MM 3D SCREENING MAMMOGRAM BILATERAL BREAST     Insulin  dependent type 2 diabetes mellitus (HCC)        Relevant Medications   insulin  glargine (LANTUS  SOLOSTAR) 100 UNIT/ML Solostar Pen   losartan  (COZAAR ) 50 MG tablet   dapagliflozin  propanediol (FARXIGA ) 10 MG TABS tablet   atorvastatin  (LIPITOR ) 80 MG tablet       Return in about 4 months (around 03/06/2025) for fasting lab work prior.   Total time spent: 25 minutes. This time includes review of previous notes and results and patient face to face interaction during today's visit.  Jeoffrey Pollen, NP  11/05/2024   This document may have been prepared by Dragon Voice Recognition software and as such may include unintentional dictation errors.      [1]  Allergies Allergen Reactions   Codeine Anaphylaxis    Throat swells up   Bupropion     Unknown   Cefuroxime Rash   Ciprofloxacin Rash   Fluconazole Itching   Ibuprofen  Itching   Iodine Hives   Ivp Dye [Iodinated Contrast Media] Hives   Metformin And Related Itching   Nizoral [Ketoconazole] Other (See Comments)    Dizzy/ Fainting   Sulfa Antibiotics Hives   Voltaren [Diclofenac Sodium] Diarrhea and Nausea And Vomiting  [2]  Outpatient Medications Prior to Visit  Medication Sig   aspirin  EC 81 MG tablet Take 1 tablet (81 mg total) by mouth daily.   Blood Glucose Calibration (TRUE METRIX LEVEL 1) Low SOLN    dorzolamide -timolol  (COSOPT ) 22.3-6.8 MG/ML ophthalmic solution Place 1 drop into both eyes 2 (two) times daily.   ergocalciferol  (VITAMIN D2) 1.25 MG (50000 UT) capsule Take 1 capsule (50,000 Units total) by mouth once a week.   fluticasone  (FLONASE ) 50 MCG/ACT nasal spray PLACE 1 SPRAY IN EACH NOSTRIL ONCE DAILYAS NEEDED FOR ALLERGIES   gabapentin  (NEURONTIN ) 300 MG capsule Take 1-2 capsules (300-600 mg total) by mouth at bedtime as needed (pain-pt takes 1-2 capsules at bedtime PRN).   glucose blood (PRODIGY NO CODING BLOOD GLUC) test strip Use as instructed   nitroGLYCERIN  (NITROSTAT ) 0.4 MG SL tablet Place 1 tablet (0.4 mg total) under the tongue every 5  (five) minutes as needed for chest pain.   prasugrel  (EFFIENT ) 10 MG TABS tablet Take 1 tablet (10 mg total) by mouth daily.   [DISCONTINUED] atorvastatin  (LIPITOR ) 80 MG tablet Take 1 tablet (80 mg total) by mouth daily.   [DISCONTINUED] carvedilol  (COREG ) 3.125 MG tablet Take 1 tablet (3.125 mg total) by mouth 2 (two) times daily with a meal.   [DISCONTINUED] LANTUS  SOLOSTAR 100 UNIT/ML Solostar Pen INJECT 35 UNITS SUBCUTANEOUSLY AT BEDTIME   [DISCONTINUED] losartan  (COZAAR ) 50 MG tablet Take 1 tablet (50 mg total) by mouth daily.   Budeson-Glycopyrrol-Formoterol (BREZTRI  AEROSPHERE) 160-9-4.8 MCG/ACT AERO Inhale 2 puffs into the lungs in the morning and at bedtime. (Patient not taking: Reported on 11/05/2024)   [DISCONTINUED] acetaminophen  (TYLENOL ) 650 MG CR tablet Take 650 mg by mouth every 8 (eight) hours as needed for pain. (Patient not taking: Reported on 11/05/2024)   [DISCONTINUED] dapagliflozin  propanediol (FARXIGA ) 10 MG TABS tablet Take 1 tablet (10 mg total) by mouth daily. (Patient not taking: Reported on 11/05/2024)   [DISCONTINUED] docusate sodium  (STOOL SOFTENER) 100 MG capsule Take 1 capsule (100 mg total) by mouth daily. (Patient not taking: Reported on 11/05/2024)   [DISCONTINUED] hydrALAZINE  (APRESOLINE ) tablet 25 mg    No facility-administered medications prior to visit.   "

## 2024-11-06 ENCOUNTER — Encounter

## 2024-11-11 ENCOUNTER — Telehealth: Payer: Self-pay

## 2024-11-11 ENCOUNTER — Encounter

## 2024-11-11 DIAGNOSIS — I213 ST elevation (STEMI) myocardial infarction of unspecified site: Secondary | ICD-10-CM

## 2024-11-11 DIAGNOSIS — Z955 Presence of coronary angioplasty implant and graft: Secondary | ICD-10-CM

## 2024-11-11 NOTE — Progress Notes (Signed)
 Early Discharge Summary  Laura Camacho  Laura Camacho 1961-05-29  Laura Camacho  is discharging early due to lack of attendance. She completed 2 of 36 sessions.    6 Minute Walk     Row Name 10/01/24 1556         6 Minute Walk   Phase Initial     Distance 900 feet     Walk Time 6 minutes     # of Rest Breaks 0     MPH 1.7     METS 2.35     RPE 15     Perceived Dyspnea  1     VO2 Peak 8.2     Symptoms Yes (comment)     Comments SOB     Resting HR 58 bpm     Resting BP 148/72     Resting Oxygen Saturation  97 %     Exercise Oxygen Saturation  during 6 min walk 97 %     Max Ex. HR 86 bpm     Max Ex. BP 164/76     2 Minute Post BP 138/74

## 2024-11-11 NOTE — Progress Notes (Signed)
 Cardiac Individual Treatment Plan  Patient Details  Name: Laura Camacho  TEYONNA PLAISTED MRN: [id number] Date of Birth: [birth year] Referring Provider:   Flowsheet Row Cardiac Rehab from 10/01/2024 in [facility] Cardiac and Pulmonary Rehab  Referring Provider Dr. Virgene Cage    Initial Encounter Date:  Flowsheet Row Cardiac Rehab from 10/01/2024 in [facility] Cardiac and Pulmonary Rehab  Date 10/01/24    Visit Diagnosis: ST elevation myocardial infarction (STEMI), unspecified artery (HCC)  Status post coronary artery stent placement  Patient's Home Medications on Admission: Current Medications[1]  Past Medical History: Past Medical History:  Diagnosis Date   Complication of anesthesia    STATES WAS TOLD NOT TO TAKE UNKNOWN ANESTHESIA AFTER NECKFUSION OR HYSTERECTOMY   Diabetes mellitus without complication (HCC)    Dyslipidemia 05/05/2017   Epilepsy (HCC)    Fibromyalgia    GERD (gastroesophageal reflux disease)    Glaucoma (increased eye pressure)    History of Glaucoma but no drops   Headache    Hyperlipidemia    Hypertension    Neurofibromatosis (HCC)    Pyelonephritis     Tobacco Use: Tobacco Use History[2]  Labs: Review Flowsheet  More data may exist      Latest Ref Rng & Units 11/16/2018 05/31/2021 01/31/2023 04/25/2024 09/06/2024  Labs for ITP Cardiac and Pulmonary Rehab  Cholestrol 0 - 200 mg/dL - - 845  790  866   LDL (calc) 0 - 99 mg/dL - - 96  857  73   HDL-C >40 mg/dL - - 34  43  34   Trlycerides <150 mg/dL - - 866  867  870   Hemoglobin A1c 4.8 - 5.6 % 8.0  9.1  6.1  7.5  7.5      Exercise Target Goals: Exercise Program Goal: Individual exercise prescription set using results from initial 6 min walk test and THRR while considering  patients activity barriers and safety.   Exercise Prescription Goal: Initial exercise prescription builds to 30-45 minutes a day of aerobic activity, 2-3 days per week.  Home exercise guidelines will be given to patient during program as  part of exercise prescription that the participant will acknowledge.   Education: Aerobic Exercise: - Group verbal and visual presentation on the components of exercise prescription. Introduces F.I.T.T principle from ACSM for exercise prescriptions.  Reviews F.I.T.T. principles of aerobic exercise including progression. Written material provided at class time.   Education: Resistance Exercise: - Group verbal and visual presentation on the components of exercise prescription. Introduces F.I.T.T principle from ACSM for exercise prescriptions  Reviews F.I.T.T. principles of resistance exercise including progression. Written material provided at class time.    Education: Exercise & Equipment Safety: - Individual verbal instruction and demonstration of equipment use and safety with use of the equipment. Flowsheet Row Cardiac Rehab from 10/09/2024 in [facility] Cardiac and Pulmonary Rehab  Date 10/01/24  Educator [facility]  Instruction Review Code 1- Verbalizes Understanding    Education: Exercise Physiology & General Exercise Guidelines: - Group verbal and written instruction with models to review the exercise physiology of the cardiovascular system and associated critical values. Provides general exercise guidelines with specific guidelines to those with heart or lung disease. Written material provided at class time.   Education: Flexibility, Balance, Mind/Body Relaxation: - Group verbal and visual presentation with interactive activity on the components of exercise prescription. Introduces F.I.T.T principle from ACSM for exercise prescriptions. Reviews F.I.T.T. principles of flexibility and balance exercise training including progression. Also discusses the mind body connection.  Reviews various relaxation  techniques to help reduce and manage stress (i.e. Deep breathing, progressive muscle relaxation, and visualization). Balance handout provided to take home. Written material provided at class  time.   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 HR 86 bpm     Max Ex. BP 164/76     2 Minute Post BP 138/74        Oxygen Initial Assessment:   Oxygen Re-Evaluation:   Oxygen Discharge (Final Oxygen Re-Evaluation):   Initial Exercise Prescription:  Initial Exercise Prescription - 10/01/24 1500       Date of Initial Exercise RX and Referring Provider   Date 10/01/24    Referring Provider Dr. Virgene Cage      Oxygen   Maintain Oxygen Saturation 88% or higher      NuStep   Level 2    SPM 80    Minutes 15    METs 2.35      Arm Ergometer   Level 1    Watts 25    RPM 25    Minutes 15    METs 2.35      Track   Laps 24    Minutes 15    METs 2.31      Prescription Details   Duration Progress to 30 minutes of continuous aerobic without signs/symptoms of physical distress      Intensity   THRR 40-80% of Max Heartrate 97-136    Ratings of Perceived Exertion 11-13    Perceived Dyspnea 0-4      Progression   Progression Continue to progress workloads to maintain intensity without signs/symptoms of physical distress.      Resistance Training   Training Prescription Yes    Weight 2lb    Reps 10-15           Perform Capillary Blood Glucose checks as needed.  Exercise Prescription Changes:   Exercise Prescription Changes     Row Name 10/01/24 1600 10/16/24 1600           Response to Exercise   Blood Pressure (Admit) 148/72 110/62      Blood Pressure (Exercise) 164/76 140/80      Blood Pressure (Exit) 138/74 124/68      Heart Rate (Admit) 58 bpm 62 bpm      Heart Rate (Exercise) 86  bpm 108 bpm      Heart Rate (Exit) 56 bpm 80 bpm      Oxygen Saturation (Admit) 97 % --      Oxygen Saturation (Exercise) 97 % --      Oxygen Saturation (Exit) 97 % --      Rating of Perceived Exertion (Exercise) 15 17      Perceived Dyspnea (Exercise) 1 0      Symptoms SOB none      Comments results first 2 weeks of exercise      Duration -- Progress to 30 minutes of  aerobic without signs/symptoms of physical distress      Intensity -- THRR unchanged        Progression   Progression -- Continue to progress workloads to maintain intensity without signs/symptoms of physical distress.      Average METs -- 2.8        Resistance Training   Training Prescription -- Yes      Weight -- 2lb      Reps -- 10-15        Interval Training   Interval Training -- No        NuStep   Level -- 2      Minutes -- 15      METs -- 2.7        Arm Ergometer   Level -- 2      Watts -- 25      RPM -- 25      Minutes -- 15      METs -- 2.95        Oxygen   Maintain Oxygen Saturation -- 88% or higher         Exercise Comments:   Exercise Comments     Row Name 10/09/24 1602           Exercise Comments First full day of exercise!  Patient was oriented to gym and equipment including functions, settings, policies, and procedures.  Patient's individual exercise prescription and treatment plan were reviewed.  All starting workloads were established based on the results of the 6 minute walk test done at initial orientation visit.  The plan for exercise progression was also introduced and progression  will be customized based on patient's performance and goals.          Exercise Goals and Review:   Exercise Goals     Row Name 10/01/24 1601             Exercise Goals   Increase Physical Activity Yes       Intervention Provide advice, education, support and counseling about physical activity/exercise needs.;Develop an individualized exercise prescription for aerobic and resistive training based on initial evaluation findings, risk stratification, comorbidities and participant's personal goals.       Expected Outcomes Short Term: Attend rehab on a regular basis to increase amount of physical activity.;Long Term: Add in home exercise to make exercise part of routine and to increase amount of physical activity.;Long Term: Exercising regularly at least 3-5 days a week.       Increase Strength and Stamina Yes       Intervention Provide advice, education, support and counseling about physical activity/exercise needs.;Develop an individualized exercise prescription for aerobic and resistive training based on initial evaluation findings, risk stratification, comorbidities and participant's personal goals.       Expected Outcomes Short Term: Increase workloads from initial exercise prescription for resistance, speed, and METs.;Short Term: Perform resistance  training exercises routinely during rehab and add in resistance training at home;Long Term: Improve cardiorespiratory fitness, muscular endurance and strength as measured by increased METs and functional capacity ( )       Able to understand and use rate of perceived exertion (RPE) scale Yes       Intervention Provide education and explanation on how to use RPE scale       Expected Outcomes Short Term: Able to use RPE daily in rehab to express subjective intensity level;Long Term:  Able to use RPE to guide intensity level when exercising independently       Able to understand and use Dyspnea scale Yes       Intervention Provide education and  explanation on how to use Dyspnea scale       Expected Outcomes Short Term: Able to use Dyspnea scale daily in rehab to express subjective sense of shortness of breath during exertion;Long Term: Able to use Dyspnea scale to guide intensity level when exercising independently       Knowledge and understanding of Target Heart Rate Range (THRR) Yes       Intervention Provide education and explanation of THRR including how the numbers were predicted and where they are located for reference       Expected Outcomes Long Term: Able to use THRR to govern intensity when exercising independently;Short Term: Able to use daily as guideline for intensity in rehab;Short Term: Able to state/look up THRR       Able to check pulse independently Yes       Intervention Provide education and demonstration on how to check pulse in carotid and radial arteries.;Review the importance of being able to check your own pulse for safety during independent exercise       Expected Outcomes Short Term: Able to explain why pulse checking is important during independent exercise;Long Term: Able to check pulse independently and accurately       Understanding of Exercise Prescription Yes       Intervention Provide education, explanation, and written materials on patient's individual exercise prescription       Expected Outcomes Short Term: Able to explain program exercise prescription;Long Term: Able to explain home exercise prescription to exercise independently          Exercise Goals Re-Evaluation :  Exercise Goals Re-Evaluation     Row Name 10/09/24 1602 10/16/24 1604 10/28/24 0930         Exercise Goal Re-Evaluation   Exercise Goals Review Increase Physical Activity;Able to understand and use rate of perceived exertion (RPE) scale;Knowledge and understanding of Target Heart Rate Range (THRR);Understanding of Exercise Prescription;Able to understand and use Dyspnea scale;Able to check pulse independently;Increase Strength and  Stamina Increase Physical Activity;Increase Strength and Stamina;Understanding of Exercise Prescription Increase Physical Activity;Increase Strength and Stamina;Understanding of Exercise Prescription     Comments Reviewed RPE and dyspnea scale, THR and program prescription with pt today.  Pt voiced understanding and was given a copy of goals to take home. Trinidee  is off to a good start in the program, and was able to attend her first session during this review period. During her session she was able to use the T4 nustep and recumbent bike at level 2. We will continue to moniotr her progress in the program. Asuna  has not attended since 10/09/2024. We will be in touch with her to determine when she will return. We will continue to monitor her progress in the program when she returns.  Expected Outcomes Short: Use RPE daily to regulate intensity. Long: Follow program prescription in THR. Short: Continue to follow exercise prescription. Long: Continue exercise to improve strength and stamina. Short: Return to rehab. Long: Continue exercise to improve strength and stamina.        Discharge Exercise Prescription (Final Exercise Prescription Changes):  Exercise Prescription Changes - 10/16/24 1600       Response to Exercise   Blood Pressure (Admit) 110/62    Blood Pressure (Exercise) 140/80    Blood Pressure (Exit) 124/68    Heart Rate (Admit) 62 bpm    Heart Rate (Exercise) 108 bpm    Heart Rate (Exit) 80 bpm    Rating of Perceived Exertion (Exercise) 17    Perceived Dyspnea (Exercise) 0    Symptoms none    Comments first 2 weeks of exercise    Duration Progress to 30 minutes of  aerobic without signs/symptoms of physical distress    Intensity THRR unchanged      Progression   Progression Continue to progress workloads to maintain intensity without signs/symptoms of physical distress.    Average METs 2.8      Resistance Training   Training Prescription Yes    Weight 2lb    Reps 10-15       Interval Training   Interval Training No      NuStep   Level 2    Minutes 15    METs 2.7      Arm Ergometer   Level 2    Watts 25    RPM 25    Minutes 15    METs 2.95      Oxygen   Maintain Oxygen Saturation 88% or higher          Nutrition:  Target Goals: Understanding of nutrition guidelines, daily intake of sodium 1500mg , cholesterol 200mg , calories 30% from fat and 7% or less from saturated fats, daily to have 5 or more servings of fruits and vegetables.  Education: Nutrition 1 -Group instruction provided by verbal, written material, interactive activities, discussions, models, and posters to present general guidelines for heart healthy nutrition including macronutrients, label reading, and promoting whole foods over processed counterparts. Education serves as pensions consultant of discussion of heart healthy eating for all. Written material provided at class time.    Education: Nutrition 2 -Group instruction provided by verbal, written material, interactive activities, discussions, models, and posters to present general guidelines for heart healthy nutrition including sodium, cholesterol, and saturated fat. Providing guidance of habit forming to improve blood pressure, cholesterol, and body weight. Written material provided at class time.     Biometrics:  Pre Biometrics - 10/01/24 1601       Pre Biometrics   Height 5' 4.4 (1.636 m)    Weight 184 lb 9.6 oz (83.7 kg)    Waist Circumference 38 inches    Hip Circumference 44 inches    Waist to Hip Ratio 0.86 %    BMI (Calculated) 31.28    Single Leg Stand 4.5 seconds           Nutrition Therapy Plan and Nutrition Goals:   Nutrition Assessments:  MEDIFICTS Score Key: >=70 Need to make dietary changes  40-70 Heart Healthy Diet <= 40 Therapeutic Level Cholesterol Diet   Picture Your Plate Scores: <59 Unhealthy dietary pattern with much room for improvement. 41-50 Dietary pattern unlikely to meet  recommendations for good health and room for improvement. 51-60 More healthful dietary pattern, with some room for improvement.  >  60 Healthy dietary pattern, although there may be some specific behaviors that could be improved.    Nutrition Goals Re-Evaluation:   Nutrition Goals Discharge (Final Nutrition Goals Re-Evaluation):   Psychosocial: Target Goals: Acknowledge presence or absence of significant depression and/or stress, maximize coping skills, provide positive support system. Participant is able to verbalize types and ability to use techniques and skills needed for reducing stress and depression.   Education: Stress, Anxiety, and Depression - Group verbal and visual presentation to define topics covered.  Reviews how body is impacted by stress, anxiety, and depression.  Also discusses healthy ways to reduce stress and to treat/manage anxiety and depression. Written material provided at class time. Flowsheet Row Cardiac Rehab from 10/09/2024 in [facility] Cardiac and Pulmonary Rehab  Date 10/09/24  Educator [facility]  Instruction Review Code 1- Verbalizes Understanding    Education: Sleep Hygiene -Provides group verbal and written instruction about how sleep can affect your health.  Define sleep hygiene, discuss sleep cycles and impact of sleep habits. Review good sleep hygiene tips.   Initial Review & Psychosocial Screening:  Initial Psych Review & Screening - 10/01/24 1551       Initial Review   Current issues with None Identified      Family Dynamics   Good Support System? Yes      Barriers   Psychosocial barriers to participate in program There are no identifiable barriers or psychosocial needs.      Screening Interventions   Interventions Encouraged to exercise;Provide feedback about the scores to participant;To provide support and resources with identified psychosocial needs    Expected Outcomes Short Term goal: Utilizing psychosocial counselor, staff and physician to assist  with identification of specific Stressors or current issues interfering with healing process. Setting desired goal for each stressor or current issue identified.;Long Term Goal: Stressors or current issues are controlled or eliminated.;Short Term goal: Identification and review with participant of any Quality of Life or Depression concerns found by scoring the questionnaire.;Long Term goal: The participant improves quality of Life and PHQ9 Scores as seen by post scores and/or verbalization of changes          Quality of Life Scores:   Scores of 19 and below usually indicate a poorer quality of life in these areas.  A difference of  2-3 points is a clinically meaningful difference.  A difference of 2-3 points in the total score of the Quality of Life Index has been associated with significant improvement in overall quality of life, self-image, physical symptoms, and general health in studies assessing change in quality of life.  PHQ-9: Review Flowsheet       10/01/2024 01/16/2018  Depression screen PHQ 2/9  Decreased Interest - 0  Down, Depressed, Hopeless 0 0  PHQ - 2 Score 0 0  Altered sleeping 3 -  Tired, decreased energy 3 -  Change in appetite 3 -  Feeling bad or failure about yourself  0 -  Trouble concentrating 1 -  Moving slowly or fidgety/restless 0 -  Suicidal thoughts 0 -  PHQ-9 Score 10 -  Difficult doing work/chores Somewhat difficult -   Interpretation of Total Score  Total Score Depression Severity:  1-4 = Minimal depression, 5-9 = Mild depression, 10-14 = Moderate depression, 15-19 = Moderately severe depression, 20-27 = Severe depression   Psychosocial Evaluation and Intervention:  Psychosocial Evaluation - 10/01/24 1552       Psychosocial Evaluation & Interventions   Interventions Encouraged to exercise with the program and  follow exercise prescription    Comments Ms. Frame is coming to cardiac rehab post MI and stent. She is accompanied by her granddaughter  who is one of her family members who help her. They help manage her medications and appointments. When asked about stress, she states not at this specific time. She mentions her main hobby is playing games on her computer. She is interested in attending the program to hopefully boost stamina and strength    Expected Outcomes Short: attend cardiac rehab for education and exercise Long: develop and maintain positive self care habits    Continue Psychosocial Services  Follow up required by staff          Psychosocial Re-Evaluation:   Psychosocial Discharge (Final Psychosocial Re-Evaluation):   Vocational Rehabilitation: Provide vocational rehab assistance to qualifying candidates.   Vocational Rehab Evaluation & Intervention:  Vocational Rehab - 10/01/24 1459       Initial Vocational Rehab Evaluation & Intervention   Assessment shows need for Vocational Rehabilitation No          Education: Education Goals: Education classes will be provided on a variety of topics geared toward better understanding of heart health and risk factor modification. Participant will state understanding/return demonstration of topics presented as noted by education test scores.  Learning Barriers/Preferences:  Learning Barriers/Preferences - 10/01/24 1459       Learning Barriers/Preferences   Learning Barriers Sight   Blind in Right Eye   Learning Preferences Verbal Instruction          General Cardiac Education Topics:  AED/CPR: - Group verbal and written instruction with the use of models to demonstrate the basic use of the AED with the basic ABC's of resuscitation.   Test and Procedures: - Group verbal and visual presentation and models provide information about basic cardiac anatomy and function. Reviews the testing methods done to diagnose heart disease and the outcomes of the test results. Describes the treatment choices: Medical Management, Angioplasty, or Coronary Bypass Surgery for  treating various heart conditions including Myocardial Infarction, Angina, Valve Disease, and Cardiac Arrhythmias. Written material provided at class time.   Medication Safety: - Group verbal and visual instruction to review commonly prescribed medications for heart and lung disease. Reviews the medication, class of the drug, and side effects. Includes the steps to properly store meds and maintain the prescription regimen. Written material provided at class time.   Intimacy: - Group verbal instruction through game format to discuss how heart and lung disease can affect sexual intimacy. Written material provided at class time.   Know Your Numbers and Heart Failure: - Group verbal and visual instruction to discuss disease risk factors for cardiac and pulmonary disease and treatment options.  Reviews associated critical values for Overweight/Obesity, Hypertension, Cholesterol, and Diabetes.  Discusses basics of heart failure: signs/symptoms and treatments.  Introduces Heart Failure Zone chart for action plan for heart failure. Written material provided at class time.   Infection Prevention: - Provides verbal and written material to individual with discussion of infection control including proper hand washing and proper equipment cleaning during exercise session. Flowsheet Row Cardiac Rehab from 10/09/2024 in [facility] Cardiac and Pulmonary Rehab  Date 10/01/24  Educator [facility]  Instruction Review Code 1- Verbalizes Understanding    Falls Prevention: - Provides verbal and written material to individual with discussion of falls prevention and safety. Flowsheet Row Cardiac Rehab from 10/09/2024 in [facility] Cardiac and Pulmonary Rehab  Date 10/01/24  Educator [facility]  Instruction Review Code 1- [employer]  Understanding    Other: -Provides group and verbal instruction on various topics (see comments)   Knowledge Questionnaire Score:   Core Components/Risk Factors/Patient Goals at Admission:  Personal  Goals and Risk Factors at Admission - 10/01/24 1459       Core Components/Risk Factors/Patient Goals on Admission   Diabetes Yes    Intervention Provide education about proper nutrition, including hydration, and aerobic/resistive exercise prescription along with prescribed medications to achieve blood glucose in normal ranges: Fasting glucose 65-99 mg/dL;Provide education about signs/symptoms and action to take for hypo/hyperglycemia.    Expected Outcomes Short Term: Participant verbalizes understanding of the signs/symptoms and immediate care of hyper/hypoglycemia, proper foot care and importance of medication, aerobic/resistive exercise and nutrition plan for blood glucose control.;Long Term: Attainment of HbA1C < 7%.    Hypertension Yes    Intervention Provide education on lifestyle modifcations including regular physical activity/exercise, weight management, moderate sodium restriction and increased consumption of fresh fruit, vegetables, and low fat dairy, alcohol moderation, and smoking cessation.;Monitor prescription use compliance.    Expected Outcomes Long Term: Maintenance of blood pressure at goal levels.;Short Term: Continued assessment and intervention until BP is < 140/51mm HG in hypertensive participants. < 130/42mm HG in hypertensive participants with diabetes, heart failure or chronic kidney disease.    Lipids Yes    Intervention Provide education and support for participant on nutrition & aerobic/resistive exercise along with prescribed medications to achieve LDL 70mg , HDL >40mg .    Expected Outcomes Short Term: Participant states understanding of desired cholesterol values and is compliant with medications prescribed. Participant is following exercise prescription and nutrition guidelines.;Long Term: Cholesterol controlled with medications as prescribed, with individualized exercise RX and with personalized nutrition plan. Value goals: LDL < 70mg , HDL > 40 mg.           Education:Diabetes - Individual verbal and written instruction to review signs/symptoms of diabetes, desired ranges of glucose level fasting, after meals and with exercise. Acknowledge that pre and post exercise glucose checks will be done for 3 sessions at entry of program. Flowsheet Row Cardiac Rehab from 10/09/2024 in [facility]  Date 10/01/24  Educator [facility]  Instruction Review Code 1- Verbalizes Understanding    Core Components/Risk Factors/Patient Goals Review:    Core Components/Risk Factors/Patient Goals at Discharge (Final Review):    ITP Comments:  ITP Comments     Row Name 10/01/24 1550 10/09/24 1602 10/23/24 0935 11/11/24 1453     ITP Comments Completed program orientation and . Initial ITP created and sent for review to Medical Director. First full day of exercise!  Patient was oriented to gym and equipment including functions, settings, policies, and procedures.  Patient's individual exercise prescription and treatment plan were reviewed.  All starting workloads were established based on the results of the 6 minute walk test done at initial orientation visit.  The plan for exercise progression was also introduced and progression will be customized based on patient's performance and goals. 30 Day review completed. Medical Director ITP review done, changes made as directed, and signed approval by Medical Director. New to program Early discharge due to lack of attendance       Comments: early discharge    [1]  Current Outpatient Medications:    aspirin  EC 81 MG tablet, Take 1 tablet (81 mg total) by mouth daily., Disp: 90 tablet, Rfl: 0   atorvastatin  (LIPITOR ) 80 MG tablet, Take 1 tablet (80 mg total) by mouth daily., Disp: 90 tablet, Rfl: 0  Blood Glucose Calibration (TRUE METRIX LEVEL 1) Low SOLN, , Disp: , Rfl:    Budeson-Glycopyrrol-Formoterol (BREZTRI  AEROSPHERE) 160-9-4.8 MCG/ACT AERO, Inhale 2 puffs into the lungs in the morning and at  bedtime. (Patient not taking: Reported on 11/05/2024), Disp: 10.7 g, Rfl: 5   carvedilol  (COREG ) 3.125 MG tablet, Take 1 tablet (3.125 mg total) by mouth 2 (two) times daily with a meal., Disp: 180 tablet, Rfl: 0   dapagliflozin  propanediol (FARXIGA ) 10 MG TABS tablet, Take 1 tablet (10 mg total) by mouth daily., Disp: 90 tablet, Rfl: 0   dorzolamide -timolol  (COSOPT ) 22.3-6.8 MG/ML ophthalmic solution, Place 1 drop into both eyes 2 (two) times daily., Disp: , Rfl:    ergocalciferol  (VITAMIN D2) 1.25 MG (50000 UT) capsule, Take 1 capsule (50,000 Units total) by mouth once a week., Disp: 12 capsule, Rfl: 3   fluticasone  (FLONASE ) 50 MCG/ACT nasal spray, PLACE 1 SPRAY IN EACH NOSTRIL ONCE DAILYAS NEEDED FOR ALLERGIES, Disp: 16 g, Rfl: 3   gabapentin  (NEURONTIN ) 300 MG capsule, Take 1-2 capsules (300-600 mg total) by mouth at bedtime as needed (pain-pt takes 1-2 capsules at bedtime PRN)., Disp: 90 capsule, Rfl: 3   glucose blood (PRODIGY NO CODING BLOOD GLUC) test strip, Use as instructed, Disp: 100 each, Rfl: 2   insulin  glargine (LANTUS  SOLOSTAR) 100 UNIT/ML Solostar Pen, Inject 35 Units into the skin at bedtime., Disp: 15 mL, Rfl: 7   losartan  (COZAAR ) 50 MG tablet, Take 1 tablet (50 mg total) by mouth daily., Disp: 90 tablet, Rfl: 0   nitroGLYCERIN  (NITROSTAT ) 0.4 MG SL tablet, Place 1 tablet (0.4 mg total) under the tongue every 5 (five) minutes as needed for chest pain., Disp: 25 tablet, Rfl: 11   prasugrel  (EFFIENT ) 10 MG TABS tablet, Take 1 tablet (10 mg total) by mouth daily., Disp: 90 tablet, Rfl: 0 [2]  Social History Tobacco Use  Smoking Status Every Day   Current packs/day: 0.50   Types: Cigarettes  Smokeless Tobacco Never  Tobacco Comments   0.5 ppd 06/09/2021

## 2024-11-11 NOTE — Telephone Encounter (Signed)
 No Answer, will discharge from cardiac rehab due to lack of attendance and lack of communication.

## 2024-11-13 ENCOUNTER — Encounter

## 2024-11-18 ENCOUNTER — Encounter

## 2024-11-20 ENCOUNTER — Encounter

## 2024-11-22 ENCOUNTER — Ambulatory Visit (INDEPENDENT_AMBULATORY_CARE_PROVIDER_SITE_OTHER): Admitting: Cardiology

## 2024-11-22 ENCOUNTER — Encounter: Payer: Self-pay | Admitting: Cardiology

## 2024-11-22 VITALS — BP 176/92 | HR 71 | Ht 60.0 in | Wt 179.2 lb

## 2024-11-22 DIAGNOSIS — I10 Essential (primary) hypertension: Secondary | ICD-10-CM | POA: Diagnosis not present

## 2024-11-22 DIAGNOSIS — R52 Pain, unspecified: Secondary | ICD-10-CM | POA: Diagnosis not present

## 2024-11-22 DIAGNOSIS — R269 Unspecified abnormalities of gait and mobility: Secondary | ICD-10-CM

## 2024-11-22 DIAGNOSIS — E1165 Type 2 diabetes mellitus with hyperglycemia: Secondary | ICD-10-CM | POA: Diagnosis not present

## 2024-11-22 DIAGNOSIS — H5461 Unqualified visual loss, right eye, normal vision left eye: Secondary | ICD-10-CM | POA: Diagnosis not present

## 2024-11-22 NOTE — Progress Notes (Signed)
 "  Established Patient Office Visit  Subjective:  Patient ID: Laura  [name], female    DOB: July 25, 1961  Age: 64 y.o. MRN: [id number]  Chief Complaint  Patient presents with   Acute Visit    Chest pain and headache, she claims she can hear her heartbeat in her ear.     Patient in office for an acute visit. Patient complaining of chest pain and headache. Patient reports not getting her medications as prescribed. Patient's granddaughter lives with her and dispenses her medications. Patient reports granddaughter is not giving her medication as she is suppose to take them, states she only gets one big pill daily.  Blood pressure elevated today likely due to not taking antihypertensive medication.  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 Unknown if patient is taking Effient . Patient is blind unable to dispense her own medications, unable to read the bottles. Will send in order to home health for assistance in setting up a medicine box.     No other concerns at this time.   Past Medical History:  Diagnosis Date   Complication of anesthesia    STATES WAS TOLD NOT TO TAKE UNKNOWN ANESTHESIA AFTER NECKFUSION OR HYSTERECTOMY   Diabetes mellitus without complication (HCC)    Dyslipidemia 05/05/2017   Epilepsy (HCC)    Fibromyalgia    GERD (gastroesophageal reflux disease)    Glaucoma (increased eye pressure)    History of Glaucoma but no drops   Headache    Hyperlipidemia    Hypertension    Neurofibromatosis (HCC)    Pyelonephritis     Past Surgical History:  Procedure Laterality Date   ABDOMINAL HYSTERECTOMY     has both ovaries- [facility]   Bilateral Arm Surgery Bilateral    'tumors' removed   BREAST BIOPSY Left 11/05/2018   us  core venus clip  BIOPSY: GRANULAR CELL TUMOR, 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   BREAST BIOPSY Left 11/05/2018   us  core axilla hydro marker  NEGATIVE FOR METASTATIC CARCINOMA   CERVICAL  FUSION     COLON SURGERY     COLONOSCOPY N/A 07/27/2020   Procedure: COLONOSCOPY;  Surgeon: Maryruth Ole DASEN, MD;  Location: [facility] ENDOSCOPY;  Service: Endoscopy;  Laterality: N/A;   COLONOSCOPY WITH PROPOFOL  N/A 02/12/2016   Procedure: COLONOSCOPY WITH PROPOFOL ;  Surgeon: Donnice Vaughn Manes, MD;  Location: [facility] ENDOSCOPY;  Service: Endoscopy;  Laterality: N/A;   COLONOSCOPY WITH PROPOFOL  N/A 06/30/2020   Procedure: COLONOSCOPY WITH PROPOFOL ;  Surgeon: Maryruth Ole DASEN, MD;  Location: [facility] ENDOSCOPY;  Service: Endoscopy;  Laterality: N/A;   CORONARY/GRAFT ACUTE MI REVASCULARIZATION N/A 09/06/2024   Procedure: Coronary/Graft Acute MI Revascularization;  Surgeon: Darron Deatrice DELENA, MD;  Location: [facility] INVASIVE CV LAB;  Service: Cardiovascular;  Laterality: N/A;   ESOPHAGOGASTRODUODENOSCOPY (EGD) WITH PROPOFOL  N/A 02/12/2016   Procedure: ESOPHAGOGASTRODUODENOSCOPY (EGD) WITH PROPOFOL ;  Surgeon: Donnice Vaughn Manes, MD;  Location: [facility] ENDOSCOPY;  Service: Endoscopy;  Laterality: N/A;   ESOPHAGOGASTRODUODENOSCOPY (EGD) WITH PROPOFOL  N/A 06/30/2020   Procedure: ESOPHAGOGASTRODUODENOSCOPY (EGD) WITH PROPOFOL ;  Surgeon: Maryruth Ole DASEN, MD;  Location: [facility] ENDOSCOPY;  Service: Endoscopy;  Laterality: N/A;   LEFT HEART CATH AND CORONARY ANGIOGRAPHY N/A 09/06/2024   Procedure: LEFT HEART CATH AND CORONARY ANGIOGRAPHY;  Surgeon: Darron Deatrice DELENA, MD;  Location: [facility] INVASIVE CV LAB;  Service: Cardiovascular;  Laterality: N/A;   VULVECTOMY N/A 06/26/2017   Procedure: WIDE EXCISION VULVECTOMY;  Surgeon: Kathe Gladis DELENA, MD;  Location: [facility]  ORS;  Service: Gynecology;  Laterality: N/A;    Social History   Socioeconomic History   Marital status: Single    Spouse name: Not on file   Number of children: Not on file   Years of education: Not on file   Highest education level: Not on file  Occupational History   Not on file  Tobacco Use   Smoking status: Every Day    Current packs/day: 0.50     Types: Cigarettes   Smokeless tobacco: Never   Tobacco comments:    0.5 ppd 06/09/2021  Vaping Use   Vaping status: Never Used  Substance and Sexual Activity   Alcohol use: Yes    Alcohol/week: 3.0 - 4.0 standard drinks of alcohol    Types: 3 - 4 Shots of liquor per week    Comment: once a week   Drug use: Not Currently    Types: Marijuana    Comment: monthly   Sexual activity: Yes    Birth control/protection: Surgical  Other Topics Concern   Not on file  Social History Narrative   Not on file   Social Drivers of Health   Tobacco Use: High Risk (11/22/2024)   Patient History    Smoking Tobacco Use: Every Day    Smokeless Tobacco Use: Never    Passive Exposure: Not on file  Financial Resource Strain: Not on file  Food Insecurity: Food Insecurity Present (09/07/2024)   Epic    Worried About Programme Researcher, Broadcasting/film/video in the Last Year: Often true    Barista in the Last Year: Often true  Transportation Needs: Unmet Transportation Needs (09/07/2024)   Epic    Lack of Transportation (Medical): Yes    Lack of Transportation (Non-Medical): Yes  Physical Activity: Not on file  Stress: Not on file  Social Connections: Not on file  Intimate Partner Violence: Not At Risk (09/07/2024)   Epic    Fear of Current or Ex-Partner: No    Emotionally Abused: No    Physically Abused: No    Sexually Abused: No  Depression (PHQ2-9): Medium Risk (10/01/2024)   Depression (PHQ2-9)    PHQ-2 Score: 10  Alcohol Screen: Not on file  Housing: Unknown (09/07/2024)   Epic    Unable to Pay for Housing in the Last Year: No    Number of Times Moved in the Last Year: Not on file    Homeless in the Last Year: No  Utilities: Not At Risk (09/07/2024)   Epic    Threatened with loss of utilities: No  Health Literacy: Not on file    Family History  Problem Relation Age of Onset   Diabetes Mother    Diabetes Father    Diabetes Maternal Aunt    Diabetes Maternal Uncle    Breast cancer Cousin     Ovarian cancer Neg Hx    Colon cancer Neg Hx     Allergies[1]  Show/hide medication list[2]  Review of Systems  Constitutional: Negative.   HENT: Negative.    Eyes: Negative.   Respiratory: Negative.  Negative for shortness of breath.   Cardiovascular:  Positive for chest pain.  Gastrointestinal: Negative.  Negative for abdominal pain, constipation and diarrhea.  Genitourinary: Negative.   Musculoskeletal:  Negative for joint pain and myalgias.  Skin: Negative.   Neurological:  Positive for headaches. Negative for dizziness.  Endo/Heme/Allergies: Negative.   All other systems reviewed and are negative.      Objective:   BP ROLLEN)  176/92 (BP Location: Left Arm, Patient Position: Sitting, Cuff Size: Large)   Pulse 71   Ht 5' (1.524 m)   Wt 179 lb 3.2 oz (81.3 kg)   SpO2 97%   BMI 35.00 kg/m   Vitals:   11/22/24 1122 11/22/24 1143  BP: (!) 192/112 (!) 176/92  Pulse: 71   Height: 5' (1.524 m)   Weight: 179 lb 3.2 oz (81.3 kg)   SpO2: 97%   BMI (Calculated): 35     Physical Exam Vitals and nursing note reviewed.  Constitutional:      Appearance: Normal appearance. She is normal weight.  HENT:     Head: Normocephalic and atraumatic.     Nose: Nose normal.     Mouth/Throat:     Mouth: Mucous membranes are moist.  Eyes:     Extraocular Movements: Extraocular movements intact.     Conjunctiva/sclera: Conjunctivae normal.     Pupils: Pupils are equal, round, and reactive to light.  Cardiovascular:     Rate and Rhythm: Normal rate and regular rhythm.     Pulses: Normal pulses.     Heart sounds: Normal heart sounds.  Pulmonary:     Effort: Pulmonary effort is normal.     Breath sounds: Normal breath sounds.  Abdominal:     General: Abdomen is flat. Bowel sounds are normal.     Palpations: Abdomen is soft.  Musculoskeletal:        General: Normal range of motion.     Cervical back: Normal range of motion.  Skin:    General: Skin is warm and dry.  Neurological:      General: No focal deficit present.     Mental Status: She is alert and oriented to person, place, and time.  Psychiatric:        Mood and Affect: Mood normal.        Behavior: Behavior normal.        Thought Content: Thought content normal.        Judgment: Judgment normal.      No results found for any visits on 11/22/24.  Recent Results (from the past 2160 hours)  Hemoglobin A1c     Status: Abnormal   Collection Time: 09/06/24  5:46 PM  Result Value Ref Range   Hgb A1c MFr Bld 7.5 (H) 4.8 - 5.6 %    Comment: (NOTE) Diagnosis of Diabetes The following HbA1c ranges recommended by the American Diabetes Association (ADA) may be used as an aid in the diagnosis of diabetes mellitus.  Hemoglobin             Suggested A1C NGSP%              Diagnosis  <5.7                   Non Diabetic  5.7-6.4                Pre-Diabetic  >6.4                   Diabetic  <7.0                   Glycemic control for                       adults with diabetes.     Mean Plasma Glucose 168.55 mg/dL    Comment: Performed at [facility], [address] [place], [place]  [address]  CBC with Differential/Platelet     Status: Abnormal   Collection Time: 09/06/24  5:46 PM  Result Value Ref Range   WBC 10.6 (H) 4.0 - 10.5 K/uL   RBC 4.87 3.87 - 5.11 MIL/uL   Hemoglobin 13.2 12.0 - 15.0 g/dL   HCT 60.0 63.9 - 53.9 %   MCV 81.9 80.0 - 100.0 fL   MCH 27.1 26.0 - 34.0 pg   MCHC 33.1 30.0 - 36.0 g/dL   RDW 85.1 88.4 - 84.4 %   Platelets 411 (H) 150 - 400 K/uL   nRBC 0.0 0.0 - 0.2 %   Neutrophils Relative % 37 %   Neutro Abs 3.9 1.7 - 7.7 K/uL   Lymphocytes Relative 55 %   Lymphs Abs 5.8 (H) 0.7 - 4.0 K/uL   Monocytes Relative 5 %   Monocytes Absolute 0.6 0.1 - 1.0 K/uL   Eosinophils Relative 2 %   Eosinophils Absolute 0.2 0.0 - 0.5 K/uL   Basophils Relative 1 %   Basophils Absolute 0.1 0.0 - 0.1 K/uL   Immature Granulocytes 0 %   Abs Immature Granulocytes 0.03 0.00 - 0.07 K/uL     Comment: Performed at [facility], [address] [place], [place] [address]  Protime-INR     Status: None   Collection Time: 09/06/24  5:46 PM  Result Value Ref Range   Prothrombin Time 13.3 11.4 - 15.2 seconds   INR 1.0 0.8 - 1.2    Comment: (NOTE) INR goal varies based on device and disease states. Performed at [facility], [address] [place], [place] [address]   APTT     Status: None   Collection Time: 09/06/24  5:46 PM  Result Value Ref Range   aPTT 28 24 - 36 seconds    Comment: Performed at [facility], [address] [place], [place] [address]  Comprehensive metabolic panel     Status: Abnormal   Collection Time: 09/06/24  5:46 PM  Result Value Ref Range   Sodium 139 135 - 145 mmol/L   Potassium 2.8 (L) 3.5 - 5.1 mmol/L   Chloride 102 98 - 111 mmol/L   CO2 27 22 - 32 mmol/L   Glucose, Bld 200 (H) 70 - 99 mg/dL    Comment: Glucose reference range applies only to samples taken after fasting for at least 8 hours.   BUN 7 (L) 8 - 23 mg/dL   Creatinine, Ser 8.79 (H) 0.44 - 1.00 mg/dL   Calcium  8.7 (L) 8.9 - 10.3 mg/dL   Total Protein 7.1 6.5 - 8.1 g/dL   Albumin 3.3 (L) 3.5 - 5.0 g/dL   AST 19 15 - 41 U/L   ALT 12 0 - 44 U/L   Alkaline Phosphatase 71 38 - 126 U/L   Total Bilirubin 0.5 0.0 - 1.2 mg/dL   GFR, Estimated 51 (L) >60 mL/min    Comment: (NOTE) Calculated using the CKD-EPI Creatinine Equation (2021)    Anion gap 10 5 - 15    Comment: Performed at [facility], [address] [place], [place] [address]  Troponin I (High Sensitivity)     Status: Abnormal   Collection Time: 09/06/24  5:46 PM  Result Value Ref Range   Troponin I (High Sensitivity) 36 (H) <18 ng/L    Comment: (NOTE) Elevated high sensitivity troponin I (hsTnI) values and significant  changes across serial measurements may suggest ACS but many other  chronic and acute conditions are  known to elevate hsTnI results.  Refer to the Links section  for chest pain algorithms and additional  guidance. Performed at [facility], [address] [place], [place] [address]   Lipid panel     Status: Abnormal   Collection Time: 09/06/24  5:46 PM  Result Value Ref Range   Cholesterol 133 0 - 200 mg/dL   Triglycerides 870 <849 mg/dL   HDL 34 (L) >59 mg/dL   Total CHOL/HDL Ratio 3.9 RATIO   VLDL 26 0 - 40 mg/dL   LDL Cholesterol 73 0 - 99 mg/dL    Comment:        Total Cholesterol/HDL:CHD Risk Coronary Heart Disease Risk Table                     Men   Women  1/2 Average Risk   3.4   3.3  Average Risk       5.0   4.4  2 X Average Risk   9.6   7.1  3 X Average Risk  23.4   11.0        Use the calculated Patient Ratio above and the CHD Risk Table to determine the patient's CHD Risk.        ATP III CLASSIFICATION (LDL):  <100     mg/dL   Optimal  899-870  mg/dL   Near or Above                    Optimal  130-159  mg/dL   Borderline  839-810  mg/dL   High  >809     mg/dL   Very High Performed at [facility], [address] [place], [place] [address]   CG4 I-STAT (Lactic acid)     Status: None   Collection Time: 09/06/24  6:50 PM  Result Value Ref Range   Lactic Acid, Venous 0.9 0.5 - 1.9 mmol/L  POCT Activated clotting time     Status: None   Collection Time: 09/06/24  6:53 PM  Result Value Ref Range   Activated Clotting Time 314 seconds    Comment: Reference range 74-137 seconds for patients not on anticoagulant therapy.  Glucose, capillary     Status: Abnormal   Collection Time: 09/06/24  7:29 PM  Result Value Ref Range   Glucose-Capillary 220 (H) 70 - 99 mg/dL    Comment: Glucose reference range applies only to samples taken after fasting for at least 8 hours.  MRSA Next Gen by PCR, Nasal     Status: None   Collection Time: 09/06/24  7:51 PM   Specimen: Nasal Mucosa; Nasal Swab  Result Value Ref Range   MRSA by PCR Next Gen NOT DETECTED NOT DETECTED    Comment: (NOTE) The GeneXpert MRSA Assay  (FDA approved for NASAL specimens only), is one component of a comprehensive MRSA colonization surveillance program. It is not intended to diagnose MRSA infection nor to guide or monitor treatment for MRSA infections. Test performance is not FDA approved in patients less than 36 years old. Performed at [facility], [address] [place], [place] [address]   Glucose, capillary     Status: Abnormal   Collection Time: 09/06/24  8:27 PM  Result Value Ref Range   Glucose-Capillary 243 (H) 70 - 99 mg/dL    Comment: Glucose reference range applies only to samples taken after fasting for at least 8 hours.  [facility] [employer] (PGX) Clopidogrel  Test  Status: None   Collection Time: 09/06/24  8:41 PM  Result Value Ref Range   Disclaimer See Notes     Comment: The interpretations and drug considerations provided by [employer] are intended solely for use by a medical professional and do not constitute medical advice by [facility]. All treatment decisions and diagnoses remain the full responsibility of the treating  provider. Results included in this report are based on the guidelines published by the FDA and CPIC, and do not account for other factors that may impact drug response, such as environment, medical conditions, drug-drug interactions, or additional  genetic variants. [facility] is not responsible or liable for any errors, omissions, or ambiguities in the interpretation or use of the results of this report. Administration of any medication listed in this report requires careful therapeutic monitoring  regardless of the drug considerations outlined in this report. All dates and times displayed are Pacific Time and may vary from the dates and times for Collection, Order and Report for the providers/patients.Note to Patient: This report is intended  for  use by a medical professional. Please discuss any adjustments to your medication with your treating provider.    Interpretation Methods and  Limitations See Notes     Comment: Data were generated from extracted DNA using the validated [facility] Exome+ assay by the [facility] clinical laboratory. The Exome+ assay is based on target enrichment followed by next generation sequencing using paired end reads on an Illumina DNA sequencing  system. Star alleles were determined using a proprietary algorithm which performs variant calling and then determines star allele solutions based on a combination of defining SNPs and exon-level copy number.  Metabolizer status was determined based on  star allele solutions according to [facility] guidelines, with the following exceptions: (1) metabolizer status was set as Indeterminate if a novel nonsense or truncating novel mutation was observed within the gene, (2) metabolizer status was set as  Indeterminate if the combination of defining SNPs and copy number suggested a novel star allele solution, and (3) if more than two copies of a gene were detected then metabolizer status was set as Indeterminate. Drug/gene considerations were limit ed to  guidelines published by FDA, CPIC, or PharmGKB.  Phasing could not be performed for genotypes, and therefore in some cases the star allele solution could not be disambiguated between two or more equally likely possibilities. In these cases, if the  metabolizer status was the same regardless of possible star allele solutions, the more common star allele solution was provided along with the metabolizer status. If the metabolizer status was different for the equally-likely star allele solutions, the  star alleles were reported as Unknown and the metabolizer status was considered Indeterminate.  All samples were sequenced and interpreted in [facility]'s CLIA-certified (#[id number]) and CAP-accredited (#[id number]) laboratory in [facility], [place] . These  tests have not been cleared or approved by the U.S. Food and Drug Administration.  The reportable range includes the following star alleles:  CYP2C19: *1-*19, *22-*26, *28-*39.  Results are based on: Clopidogrel , CYP2C19 (CPIC A, FDA  Section 1, PGKB  1A).Vinie Alm Holm, PhD, HCLD, CQ, [email or web address]   Lactic acid, plasma     Status: Abnormal   Collection Time: 09/06/24  8:45 PM  Result Value Ref Range   Lactic Acid, Venous 3.0 (HH) 0.5 - 1.9 mmol/L    Comment: CRITICAL RESULT CALLED TO, READ BACK BY AND VERIFIED WITH CATHERINE WILSON @ 09/06/2024 2138 AB Performed at [facility], [address] [place], [place]  [address]   Troponin I (High Sensitivity)     Status: Abnormal   Collection Time: 09/06/24  8:45 PM  Result Value Ref Range   Troponin I (High Sensitivity) >24,000 (HH) <18 ng/L    Comment: CRITICAL RESULT CALLED TO, READ BACK BY AND VERIFIED WITH CATHERINE WILSON @ 09/06/2024 2151 AB (NOTE) Elevated high sensitivity troponin I (hsTnI) values and significant  changes across serial measurements may suggest ACS but many other  chronic and acute conditions are known to elevate hsTnI results.  Refer to the Links section for chest pain algorithms and additional  guidance. Performed at [facility], [address] [place], [place] [address]   CBC     Status: None   Collection Time: 09/06/24  8:45 PM  Result Value Ref Range   WBC 9.1 4.0 - 10.5 K/uL   RBC 4.92 3.87 - 5.11 MIL/uL   Hemoglobin 13.4 12.0 - 15.0 g/dL   HCT 60.4 63.9 - 53.9 %   MCV 80.3 80.0 - 100.0 fL   MCH 27.2 26.0 - 34.0 pg   MCHC 33.9 30.0 - 36.0 g/dL   RDW 85.2 88.4 - 84.4 %   Platelets 379 150 - 400 K/uL   nRBC 0.0 0.0 - 0.2 %    Comment: Performed at [facility], [address] [place], [place] [address]  Creatinine, serum     Status: Abnormal   Collection Time: 09/06/24  8:45 PM  Result Value Ref Range   Creatinine, Ser 1.15 (H) 0.44 - 1.00 mg/dL   GFR, Estimated 53 (L) >60 mL/min    Comment: (NOTE) Calculated using the CKD-EPI Creatinine Equation (2021) Performed at [facility], [address] [place], [place] [address]   Glucose, capillary     Status: Abnormal   Collection Time: 09/06/24 11:19 PM  Result Value Ref Range   Glucose-Capillary 266 (H) 70 - 99 mg/dL    Comment: Glucose reference range applies only to samples taken after fasting for at least 8 hours.  Lactic acid, plasma     Status: Abnormal   Collection Time: 09/07/24  2:27 AM  Result Value Ref Range   Lactic Acid, Venous 2.3 (HH) 0.5 - 1.9 mmol/L    Comment: CRITICAL VALUE NOTED. VALUE IS CONSISTENT WITH PREVIOUSLY REPORTED/CALLED VALUE. JAG Performed at [facility], [address] [place], [place] [address]   Basic metabolic panel     Status: Abnormal   Collection Time: 09/07/24  4:40 AM  Result Value Ref Range   Sodium 137 135 - 145 mmol/L   Potassium 3.6 3.5 - 5.1 mmol/L   Chloride 105 98 - 111 mmol/L   CO2 23 22 - 32 mmol/L   Glucose, Bld 241 (H) 70 - 99 mg/dL    Comment: Glucose reference range applies only to samples taken after fasting for at least 8 hours.   BUN 10 8 - 23 mg/dL   Creatinine, Ser 8.89 (H) 0.44 - 1.00 mg/dL   Calcium  8.8 (L) 8.9 - 10.3 mg/dL   GFR, Estimated 56 (L) >60 mL/min    Comment: (NOTE) Calculated using the CKD-EPI Creatinine Equation (2021)    Anion gap 9 5 - 15    Comment: Performed at [facility], [address] [place], [place] [address]  CBC     Status: Abnormal   Collection Time: 09/07/24  4:40 AM  Result Value Ref Range   WBC 11.0 (H) 4.0 - 10.5 K/uL   RBC 5.24 (H)  3.87 - 5.11 MIL/uL   Hemoglobin 14.4 12.0 - 15.0 g/dL   HCT 58.3 63.9 - 53.9 %   MCV 79.4 (L) 80.0 - 100.0 fL   MCH 27.5 26.0 - 34.0 pg   MCHC 34.6 30.0 - 36.0 g/dL   RDW 85.0 88.4 - 84.4 %   Platelets 398 150 - 400 K/uL   nRBC 0.0 0.0 - 0.2 %    Comment: Performed at [facility], [address], [place], [place] [address]  Lipoprotein A (LPA)     Status: Abnormal   Collection Time: 09/07/24  4:40 AM  Result Value Ref Range    Lipoprotein (a) 224.8 (H) <75.0 nmol/L    Comment: (NOTE) This test was developed and its performance characteristics determined by [facility]. It has not been cleared or approved by the Food and Drug Administration. Note:  Values greater than or equal to 75.0 nmol/L may       indicate an independent risk factor for CHD,       but must be evaluated with caution when applied       to non-Caucasian populations due to the       influence of genetic factors on Lp(a) across       ethnicities. Performed At: [facility] [address] [place], [place] [id number] Jennette Shorter MD Ey:[id number]   Lactic acid, plasma     Status: Abnormal   Collection Time: 09/07/24  4:40 AM  Result Value Ref Range   Lactic Acid, Venous 2.5 (HH) 0.5 - 1.9 mmol/L    Comment: CRITICAL VALUE NOTED. VALUE IS CONSISTENT WITH PREVIOUSLY REPORTED/CALLED VALUE ab Performed at [facility], [address] [place], [place] [address]   Magnesium      Status: None   Collection Time: 09/07/24  4:40 AM  Result Value Ref Range   Magnesium  1.9 1.7 - 2.4 mg/dL    Comment: Performed at [facility], [address] [place], [place] [address]  Glucose, capillary     Status: Abnormal   Collection Time: 09/07/24  7:24 AM  Result Value Ref Range   Glucose-Capillary 194 (H) 70 - 99 mg/dL    Comment: Glucose reference range applies only to samples taken after fasting for at least 8 hours.  ECHOCARDIOGRAM COMPLETE     Status: None   Collection Time: 09/07/24  8:36 AM  Result Value Ref Range   Weight 3,082.91 oz   Height 61 in   BP 157/68 mmHg   Ao pk vel 1.32 m/s   AR max vel 2.26 cm2   AV Peak grad 7.0 mmHg   S' Lateral 3.00 cm   Area-P 1/2 2.74 cm2   Est EF 55 - 60%   Glucose, capillary     Status: Abnormal   Collection Time: 09/07/24 11:27 AM  Result Value Ref Range   Glucose-Capillary 186 (H) 70 - 99 mg/dL    Comment: Glucose reference range applies only to samples taken after fasting for  at least 8 hours.  Glucose, capillary     Status: Abnormal   Collection Time: 09/07/24  5:22 PM  Result Value Ref Range   Glucose-Capillary 151 (H) 70 - 99 mg/dL    Comment: Glucose reference range applies only to samples taken after fasting for at least 8 hours.  Glucose, capillary     Status: Abnormal   Collection Time: 09/07/24  9:14 PM  Result Value Ref Range   Glucose-Capillary 183 (H) 70 - 99 mg/dL  Comment: Glucose reference range applies only to samples taken after fasting for at least 8 hours.  Basic metabolic panel with GFR     Status: Abnormal   Collection Time: 09/08/24  6:09 AM  Result Value Ref Range   Sodium 143 135 - 145 mmol/L   Potassium 3.1 (L) 3.5 - 5.1 mmol/L   Chloride 109 98 - 111 mmol/L   CO2 23 22 - 32 mmol/L   Glucose, Bld 132 (H) 70 - 99 mg/dL    Comment: Glucose reference range applies only to samples taken after fasting for at least 8 hours.   BUN 15 8 - 23 mg/dL   Creatinine, Ser 8.62 (H) 0.44 - 1.00 mg/dL   Calcium  9.1 8.9 - 10.3 mg/dL   GFR, Estimated 43 (L) >60 mL/min    Comment: (NOTE) Calculated using the CKD-EPI Creatinine Equation (2021)    Anion gap 13 5 - 15    Comment: Performed at [facility], [address] [place], [place] [address]  Glucose, capillary     Status: Abnormal   Collection Time: 09/08/24  8:09 AM  Result Value Ref Range   Glucose-Capillary 137 (H) 70 - 99 mg/dL    Comment: Glucose reference range applies only to samples taken after fasting for at least 8 hours.  Glucose, capillary     Status: Abnormal   Collection Time: 09/08/24 11:52 AM  Result Value Ref Range   Glucose-Capillary 132 (H) 70 - 99 mg/dL    Comment: Glucose reference range applies only to samples taken after fasting for at least 8 hours.  Glucose, capillary     Status: Abnormal   Collection Time: 09/08/24  3:57 PM  Result Value Ref Range   Glucose-Capillary 186 (H) 70 - 99 mg/dL    Comment: Glucose reference range applies only to samples  taken after fasting for at least 8 hours.  Glucose, capillary     Status: Abnormal   Collection Time: 09/08/24  8:26 PM  Result Value Ref Range   Glucose-Capillary 199 (H) 70 - 99 mg/dL    Comment: Glucose reference range applies only to samples taken after fasting for at least 8 hours.  Basic metabolic panel with GFR     Status: Abnormal   Collection Time: 09/09/24  4:46 AM  Result Value Ref Range   Sodium 140 135 - 145 mmol/L   Potassium 3.7 3.5 - 5.1 mmol/L   Chloride 109 98 - 111 mmol/L   CO2 22 22 - 32 mmol/L   Glucose, Bld 181 (H) 70 - 99 mg/dL    Comment: Glucose reference range applies only to samples taken after fasting for at least 8 hours.   BUN 14 8 - 23 mg/dL   Creatinine, Ser 8.49 (H) 0.44 - 1.00 mg/dL   Calcium  8.7 (L) 8.9 - 10.3 mg/dL   GFR, Estimated 39 (L) >60 mL/min    Comment: (NOTE) Calculated using the CKD-EPI Creatinine Equation (2021)    Anion gap 9 5 - 15    Comment: Performed at [facility], [address] [place], [place] [address]  Magnesium      Status: None   Collection Time: 09/09/24  4:46 AM  Result Value Ref Range   Magnesium  1.9 1.7 - 2.4 mg/dL    Comment: Performed at [facility], [address] [place], [place] [address]  Glucose, capillary     Status: Abnormal   Collection Time: 09/09/24  7:42 AM  Result Value Ref Range  Glucose-Capillary 150 (H) 70 - 99 mg/dL    Comment: Glucose reference range applies only to samples taken after fasting for at least 8 hours.  Basic metabolic panel with GFR     Status: Abnormal   Collection Time: 09/19/24 10:41 AM  Result Value Ref Range   Glucose 83 70 - 99 mg/dL   BUN 9 8 - 27 mg/dL   Creatinine, Ser 8.77 (H) 0.57 - 1.00 mg/dL   eGFR 50 (L) >40 fO/fpw/8.26   BUN/Creatinine Ratio 7 (L) 12 - 28   Sodium 144 134 - 144 mmol/L   Potassium 4.2 3.5 - 5.2 mmol/L   Chloride 106 96 - 106 mmol/L   CO2 25 20 - 29 mmol/L   Calcium  9.9 8.7 - 10.3 mg/dL  Glucose, capillary     Status:  Abnormal   Collection Time: 10/09/24  3:31 PM  Result Value Ref Range   Glucose-Capillary 128 (H) 70 - 99 mg/dL    Comment: Glucose reference range applies only to samples taken after fasting for at least 8 hours.  Glucose, capillary     Status: None   Collection Time: 10/09/24  4:33 PM  Result Value Ref Range   Glucose-Capillary 74 70 - 99 mg/dL    Comment: Glucose reference range applies only to samples taken after fasting for at least 8 hours.  Glucose, capillary     Status: Abnormal   Collection Time: 10/09/24  4:46 PM  Result Value Ref Range   Glucose-Capillary 69 (L) 70 - 99 mg/dL    Comment: Glucose reference range applies only to samples taken after fasting for at least 8 hours.      Assessment & Plan:  Referral sent to home health  Problem List Items Addressed This Visit       Cardiovascular and Mediastinum   Essential hypertension, benign - Primary     Endocrine   Type 2 diabetes mellitus with hyperglycemia, without long-term current use of insulin  (HCC)     Other   Whole body pain   Gait difficulty   Right eye blindness of unknown category   Relevant Orders   Ambulatory referral to Home Health    Return if symptoms worsen or fail to improve, for as scheduled.   Total time spent: 25 minutes. This time includes review of previous notes and results and patient face to face interaction during today's visit.    Jeoffrey Pollen, NP  11/22/2024   This document may have been prepared by Dragon Voice Recognition software and as such may include unintentional dictation errors.      [1]  Allergies Allergen Reactions   Codeine Anaphylaxis    Throat swells up   Bupropion     Unknown   Cefuroxime Rash   Ciprofloxacin Rash   Fluconazole Itching   Ibuprofen  Itching   Iodine Hives   Ivp Dye [Iodinated Contrast Media] Hives   Metformin And Related Itching   Nizoral [Ketoconazole] Other (See Comments)    Dizzy/ Fainting   Sulfa Antibiotics Hives    Voltaren [Diclofenac Sodium] Diarrhea and Nausea And Vomiting  [2]  Outpatient Medications Prior to Visit  Medication Sig   atorvastatin  (LIPITOR ) 80 MG tablet Take 1 tablet (80 mg total) by mouth daily.   aspirin  EC 81 MG tablet Take 1 tablet (81 mg total) by mouth daily. (Patient not taking: Reported on 11/22/2024)   Blood Glucose Calibration (TRUE METRIX LEVEL 1) Low SOLN  (Patient not taking: Reported on 11/22/2024)   Budeson-Glycopyrrol-Formoterol (  BREZTRI  AEROSPHERE) 160-9-4.8 MCG/ACT AERO Inhale 2 puffs into the lungs in the morning and at bedtime. (Patient not taking: Reported on 11/22/2024)   carvedilol  (COREG ) 3.125 MG tablet Take 1 tablet (3.125 mg total) by mouth 2 (two) times daily with a meal. (Patient not taking: Reported on 11/22/2024)   dapagliflozin  propanediol (FARXIGA ) 10 MG TABS tablet Take 1 tablet (10 mg total) by mouth daily. (Patient not taking: Reported on 11/22/2024)   dorzolamide -timolol  (COSOPT ) 22.3-6.8 MG/ML ophthalmic solution Place 1 drop into both eyes 2 (two) times daily. (Patient not taking: Reported on 11/22/2024)   ergocalciferol  (VITAMIN D2) 1.25 MG (50000 UT) capsule Take 1 capsule (50,000 Units total) by mouth once a week. (Patient not taking: Reported on 11/22/2024)   fluticasone  (FLONASE ) 50 MCG/ACT nasal spray PLACE 1 SPRAY IN EACH NOSTRIL ONCE DAILYAS NEEDED FOR ALLERGIES (Patient not taking: Reported on 11/22/2024)   gabapentin  (NEURONTIN ) 300 MG capsule Take 1-2 capsules (300-600 mg total) by mouth at bedtime as needed (pain-pt takes 1-2 capsules at bedtime PRN). (Patient not taking: Reported on 11/22/2024)   glucose blood (PRODIGY NO CODING BLOOD GLUC) test strip Use as instructed (Patient not taking: Reported on 11/22/2024)   insulin  glargine (LANTUS  SOLOSTAR) 100 UNIT/ML Solostar Pen Inject 35 Units into the skin at bedtime. (Patient not taking: Reported on 11/22/2024)   losartan  (COZAAR ) 50 MG tablet Take 1 tablet (50 mg total) by mouth daily. (Patient not  taking: Reported on 11/22/2024)   nitroGLYCERIN  (NITROSTAT ) 0.4 MG SL tablet Place 1 tablet (0.4 mg total) under the tongue every 5 (five) minutes as needed for chest pain. (Patient not taking: Reported on 11/22/2024)   prasugrel  (EFFIENT ) 10 MG TABS tablet Take 1 tablet (10 mg total) by mouth daily. (Patient not taking: Reported on 11/22/2024)   No facility-administered medications prior to visit.   "

## 2024-11-25 ENCOUNTER — Encounter

## 2024-11-27 ENCOUNTER — Encounter

## 2024-12-02 ENCOUNTER — Ambulatory Visit: Admitting: Podiatry

## 2024-12-02 ENCOUNTER — Encounter

## 2024-12-04 ENCOUNTER — Encounter

## 2024-12-09 ENCOUNTER — Encounter

## 2024-12-11 ENCOUNTER — Encounter

## 2024-12-16 ENCOUNTER — Encounter

## 2024-12-18 ENCOUNTER — Encounter

## 2024-12-23 ENCOUNTER — Encounter

## 2024-12-25 ENCOUNTER — Encounter

## 2024-12-30 ENCOUNTER — Encounter

## 2025-01-01 ENCOUNTER — Encounter

## 2025-01-06 ENCOUNTER — Encounter

## 2025-01-08 ENCOUNTER — Encounter

## 2025-01-13 ENCOUNTER — Encounter

## 2025-01-15 ENCOUNTER — Encounter

## 2025-01-20 ENCOUNTER — Encounter

## 2025-01-22 ENCOUNTER — Encounter

## 2025-01-27 ENCOUNTER — Encounter

## 2025-01-29 ENCOUNTER — Encounter

## 2025-03-14 ENCOUNTER — Ambulatory Visit: Admitting: Cardiology

## 2025-03-14 ENCOUNTER — Ambulatory Visit: Admitting: Internal Medicine

## 2025-06-12 ENCOUNTER — Encounter (INDEPENDENT_AMBULATORY_CARE_PROVIDER_SITE_OTHER)

## 2025-06-12 ENCOUNTER — Ambulatory Visit (INDEPENDENT_AMBULATORY_CARE_PROVIDER_SITE_OTHER): Admitting: Vascular Surgery
